# Patient Record
Sex: Male | Born: 1955 | Race: White | Hispanic: No | Marital: Married | Smoking: Former smoker
Health system: Southern US, Community
[De-identification: ages and names within clinical notes are randomized; demographics above are authoritative.]

## PROBLEM LIST (undated history)

## (undated) DIAGNOSIS — I10 Essential (primary) hypertension: Secondary | ICD-10-CM

## (undated) DIAGNOSIS — Z86010 Personal history of colonic polyps: Secondary | ICD-10-CM

## (undated) HISTORY — PX: COLONOSCOPY: SHX174

## (undated) HISTORY — DX: Essential (primary) hypertension: I10

## (undated) HISTORY — DX: Personal history of colonic polyps: Z86.010

---

## 2000-03-12 ENCOUNTER — Emergency Department (HOSPITAL_COMMUNITY): Admission: EM | Admit: 2000-03-12 | Discharge: 2000-03-12 | Payer: Self-pay | Admitting: Emergency Medicine

## 2002-02-13 ENCOUNTER — Emergency Department (HOSPITAL_COMMUNITY): Admission: EM | Admit: 2002-02-13 | Discharge: 2002-02-13 | Payer: Self-pay | Admitting: Emergency Medicine

## 2002-02-15 ENCOUNTER — Emergency Department (HOSPITAL_COMMUNITY): Admission: EM | Admit: 2002-02-15 | Discharge: 2002-02-15 | Payer: Self-pay | Admitting: Emergency Medicine

## 2002-02-15 ENCOUNTER — Encounter: Payer: Self-pay | Admitting: Emergency Medicine

## 2005-01-05 HISTORY — PX: APPENDECTOMY: SHX54

## 2008-04-10 ENCOUNTER — Ambulatory Visit: Payer: Self-pay | Admitting: Internal Medicine

## 2008-04-24 ENCOUNTER — Encounter: Payer: Self-pay | Admitting: Internal Medicine

## 2008-04-24 ENCOUNTER — Ambulatory Visit: Payer: Self-pay | Admitting: Internal Medicine

## 2008-04-24 DIAGNOSIS — Z860100 Personal history of colon polyps, unspecified: Secondary | ICD-10-CM

## 2008-04-24 HISTORY — DX: Personal history of colonic polyps: Z86.010

## 2008-04-24 HISTORY — DX: Personal history of colon polyps, unspecified: Z86.0100

## 2008-04-30 ENCOUNTER — Encounter: Payer: Self-pay | Admitting: Internal Medicine

## 2008-11-21 ENCOUNTER — Encounter (INDEPENDENT_AMBULATORY_CARE_PROVIDER_SITE_OTHER): Payer: Self-pay | Admitting: General Surgery

## 2008-11-21 ENCOUNTER — Inpatient Hospital Stay (HOSPITAL_COMMUNITY): Admission: EM | Admit: 2008-11-21 | Discharge: 2008-11-25 | Payer: Self-pay | Admitting: Emergency Medicine

## 2009-04-25 ENCOUNTER — Ambulatory Visit: Payer: Self-pay | Admitting: Internal Medicine

## 2009-04-25 DIAGNOSIS — I10 Essential (primary) hypertension: Secondary | ICD-10-CM

## 2009-04-25 DIAGNOSIS — Z860100 Personal history of colon polyps, unspecified: Secondary | ICD-10-CM | POA: Insufficient documentation

## 2009-04-29 ENCOUNTER — Encounter: Payer: Self-pay | Admitting: Internal Medicine

## 2009-04-29 ENCOUNTER — Telehealth: Payer: Self-pay | Admitting: Internal Medicine

## 2009-04-29 DIAGNOSIS — E781 Pure hyperglyceridemia: Secondary | ICD-10-CM | POA: Insufficient documentation

## 2009-05-06 ENCOUNTER — Telehealth: Payer: Self-pay | Admitting: Internal Medicine

## 2009-05-21 ENCOUNTER — Telehealth: Payer: Self-pay | Admitting: Internal Medicine

## 2009-05-22 ENCOUNTER — Ambulatory Visit: Payer: Self-pay | Admitting: Internal Medicine

## 2009-05-22 LAB — CONVERTED CEMR LAB
Cholesterol: 189 mg/dL (ref 0–200)
HDL: 46.3 mg/dL (ref >39.00)
LDL Cholesterol: 124 mg/dL — ABNORMAL HIGH (ref 0–99)
Total CHOL/HDL Ratio: 4
Triglycerides: 93 mg/dL (ref 0.0–149.0)
VLDL: 18.6 mg/dL (ref 0.0–40.0)

## 2009-05-27 ENCOUNTER — Ambulatory Visit: Payer: Self-pay | Admitting: Internal Medicine

## 2009-05-27 DIAGNOSIS — R7309 Other abnormal glucose: Secondary | ICD-10-CM

## 2009-05-27 DIAGNOSIS — E876 Hypokalemia: Secondary | ICD-10-CM

## 2009-05-27 DIAGNOSIS — N529 Male erectile dysfunction, unspecified: Secondary | ICD-10-CM

## 2009-05-27 LAB — CONVERTED CEMR LAB
BUN: 15 mg/dL (ref 6–23)
CO2: 28 meq/L (ref 19–32)
Calcium: 8.8 mg/dL (ref 8.4–10.5)
Chloride: 105 meq/L (ref 96–112)
Cholesterol, target level: 200 mg/dL
Creatinine, Ser: 0.9 mg/dL (ref 0.4–1.5)
GFR calc non Af Amer: 93.44 mL/min (ref >60)
Glucose, Bld: 108 mg/dL — ABNORMAL HIGH (ref 70–99)
HDL goal, serum: 40 mg/dL
Hgb A1c MFr Bld: 5.6 % (ref 4.6–6.5)
LDL Goal: 130 mg/dL
Magnesium: 2.1 mg/dL (ref 1.5–2.5)
Potassium: 4 meq/L (ref 3.5–5.1)
Sodium: 138 meq/L (ref 135–145)
Testosterone: 360.14 ng/dL (ref 350.00–890.00)

## 2009-05-29 ENCOUNTER — Telehealth: Payer: Self-pay | Admitting: Internal Medicine

## 2009-05-30 ENCOUNTER — Telehealth: Payer: Self-pay | Admitting: Internal Medicine

## 2009-05-30 ENCOUNTER — Ambulatory Visit: Payer: Self-pay | Admitting: Internal Medicine

## 2009-05-30 DIAGNOSIS — E291 Testicular hypofunction: Secondary | ICD-10-CM

## 2009-05-30 LAB — CONVERTED CEMR LAB
FSH: 3.9 milliintl units/mL (ref 1.4–18.1)
LH: 2.06 milliintl units/mL (ref 1.50–9.30)
Prolactin: 4.8 ng/mL
Testosterone: 353.55 ng/dL (ref 350.00–890.00)

## 2009-07-04 ENCOUNTER — Encounter: Payer: Self-pay | Admitting: Internal Medicine

## 2009-07-16 ENCOUNTER — Ambulatory Visit: Payer: Self-pay | Admitting: Internal Medicine

## 2009-07-16 ENCOUNTER — Telehealth: Payer: Self-pay | Admitting: Internal Medicine

## 2009-07-16 DIAGNOSIS — L03119 Cellulitis of unspecified part of limb: Secondary | ICD-10-CM

## 2009-07-16 DIAGNOSIS — L02419 Cutaneous abscess of limb, unspecified: Secondary | ICD-10-CM | POA: Insufficient documentation

## 2009-09-16 ENCOUNTER — Telehealth: Payer: Self-pay | Admitting: Internal Medicine

## 2010-01-13 ENCOUNTER — Ambulatory Visit
Admission: RE | Admit: 2010-01-13 | Discharge: 2010-01-13 | Payer: Self-pay | Source: Home / Self Care | Attending: Internal Medicine | Admitting: Internal Medicine

## 2010-01-13 DIAGNOSIS — G4700 Insomnia, unspecified: Secondary | ICD-10-CM | POA: Insufficient documentation

## 2010-02-02 LAB — CONVERTED CEMR LAB
ALT: 22 units/L (ref 0–53)
AST: 19 units/L (ref 0–37)
Albumin: 4.1 g/dL (ref 3.5–5.2)
Alkaline Phosphatase: 41 units/L (ref 39–117)
BUN: 11 mg/dL (ref 6–23)
Basophils Absolute: 0 10*3/uL (ref 0.0–0.1)
Basophils Relative: 0.7 % (ref 0.0–3.0)
Bilirubin Urine: NEGATIVE
Bilirubin, Direct: 0 mg/dL (ref 0.0–0.3)
CO2: 25 meq/L (ref 19–32)
Calcium: 9 mg/dL (ref 8.4–10.5)
Chloride: 104 meq/L (ref 96–112)
Cholesterol: 176 mg/dL (ref 0–200)
Creatinine, Ser: 0.8 mg/dL (ref 0.4–1.5)
Direct LDL: 91.3 mg/dL
Eosinophils Absolute: 0.4 10*3/uL (ref 0.0–0.7)
Eosinophils Relative: 5 % (ref 0.0–5.0)
GFR calc non Af Amer: 107.08 mL/min (ref >60)
Glucose, Bld: 110 mg/dL — ABNORMAL HIGH (ref 70–99)
HCT: 40 % (ref 39.0–52.0)
HDL: 43 mg/dL (ref >39.00)
Hemoglobin: 14.2 g/dL (ref 13.0–17.0)
Ketones, ur: NEGATIVE mg/dL
Leukocytes, UA: NEGATIVE
Lymphocytes Relative: 29.8 % (ref 12.0–46.0)
Lymphs Abs: 2.1 10*3/uL (ref 0.7–4.0)
MCHC: 35.6 g/dL (ref 30.0–36.0)
MCV: 86.2 fL (ref 78.0–100.0)
Monocytes Absolute: 0.6 10*3/uL (ref 0.1–1.0)
Monocytes Relative: 8.3 % (ref 3.0–12.0)
Neutro Abs: 3.9 10*3/uL (ref 1.4–7.7)
Neutrophils Relative %: 56.2 % (ref 43.0–77.0)
Nitrite: NEGATIVE
PSA: 0.23 ng/mL (ref 0.10–4.00)
Platelets: 309 10*3/uL (ref 150.0–400.0)
Potassium: 3.2 meq/L — ABNORMAL LOW (ref 3.5–5.1)
RBC: 4.64 M/uL (ref 4.22–5.81)
RDW: 12.9 % (ref 11.5–14.6)
Sodium: 137 meq/L (ref 135–145)
Specific Gravity, Urine: 1.025 (ref 1.000–1.030)
TSH: 0.68 microintl units/mL (ref 0.35–5.50)
Total Bilirubin: 0.1 mg/dL — ABNORMAL LOW (ref 0.3–1.2)
Total CHOL/HDL Ratio: 4
Total Protein, Urine: NEGATIVE mg/dL
Total Protein: 6.2 g/dL (ref 6.0–8.3)
Triglycerides: 571 mg/dL — ABNORMAL HIGH (ref 0.0–149.0)
Urine Glucose: NEGATIVE mg/dL
Urobilinogen, UA: 0.2 (ref 0.0–1.0)
VLDL: 114.2 mg/dL — ABNORMAL HIGH (ref 0.0–40.0)
WBC: 7 10*3/uL (ref 4.5–10.5)
pH: 6 (ref 5.0–8.0)

## 2010-02-04 NOTE — Letter (Signed)
Summary: Results Follow-up Letter  [facility]  [address] [place], [place] [address]   Phone: [phone]  Fax: [phone]    05/27/2009  [address] [place], [place]  [address]  Dear Mr. CALLEROS,   The following are the results of your recent test(s):  Test     Result     Testosterone   low/normal range Kidney     normal Blood sugars   normal   _________________________________________________________  Please call for an appointment as directed _________________________________________________________ _________________________________________________________ _________________________________________________________  Sincerely,  Sanda Linger MD  [facility]

## 2010-02-04 NOTE — Progress Notes (Signed)
  Phone Note Outgoing Call   Summary of Call: LA: please tell him that his potassium level is low so I need a pharm. for an Rx. Initial call taken by: Etta Grandchild MD,  April 29, 2009 8:28 AM  Follow-up for Phone Call        the rx was sent Follow-up by: Etta Grandchild MD,  April 29, 2009 8:27 AM  Additional Follow-up for Phone Call Additional follow up Details #1::        Patient notified and advised of lab results. He want to know if he can come by sometime an do a fasting cholesterol check?Marland KitchenMarland KitchenMarland KitchenAlvy Beal Archie CMA  April 30, 2009 10:56 AM  Additional Follow-up by: Etta Grandchild MD,  April 30, 2009 10:58 AM  New Problems: HYPERTRIGLYCERIDEMIA (ICD-272.1)   Additional Follow-up for Phone Call Additional follow up Details #2::    yes Follow-up by: Etta Grandchild MD,  April 30, 2009 10:59 AM  Additional Follow-up for Phone Call Additional follow up Details #3:: Details for Additional Follow-up Action Taken: patient notified and lab order placed in IDX.Marland KitchenAlvy Beal Archie CMA  April 30, 2009 11:50 AM   New Problems: HYPERTRIGLYCERIDEMIA (ICD-272.1) New/Updated Medications: KLOR-CON 10 10 MEQ CR-TABS (POTASSIUM CHLORIDE) One by mouth two times a day with food Prescriptions: KLOR-CON 10 10 MEQ CR-TABS (POTASSIUM CHLORIDE) One by mouth two times a day with food  #60 x 11   Entered and Authorized by:   Etta Grandchild MD   Signed by:   Etta Grandchild MD on 04/29/2009   Method used:   Electronically to        [facility] [address] [phone]* (retail)       [address] [place], [place]  [address]       Ph: [phone]       Fax: [phone]   RxID:   [id number]

## 2010-02-04 NOTE — Letter (Signed)
Summary: Lipid Letter  [facility]  [address] [place], [place] [address]   Phone: [phone]  Fax: [phone]    05/22/2009  Juan [name] [address] [place], [place]  [address]  Dear Shon Hale:  We have carefully reviewed your last lipid profile from 05/22/2009 and the results are noted below with a summary of recommendations for lipid management.    Cholesterol:       189     Goal: <200   HDL "good" Cholesterol:   43.32     Goal: >40   LDL "bad" Cholesterol:   124     Goal: <130   Triglycerides:       93.0     Goal: <150    GOOD RESULTS    TLC Diet (Therapeutic Lifestyle Change): Saturated Fats & Transfatty acids should be kept < 7% of total calories ***Reduce Saturated Fats Polyunstaurated Fat can be up to 10% of total calories Monounsaturated Fat Fat can be up to 20% of total calories Total Fat should be no greater than 25-35% of total calories Carbohydrates should be 50-60% of total calories Protein should be approximately 15% of total calories Fiber should be at least 20-30 grams a day ***Increased fiber may help lower LDL Total Cholesterol should be < 200mg /day Consider adding plant stanol/sterols to diet (example: Benacol spread) ***A higher intake of unsaturated fat may reduce Triglycerides and Increase HDL    Adjunctive Measures (may lower LIPIDS and reduce risk of Heart Attack) include: Aerobic Exercise (20-30 minutes 3-4 times a week) Limit Alcohol Consumption Weight Reduction Aspirin 75-81 mg a day by mouth (if not allergic or contraindicated) Dietary Fiber 20-30 grams a day by mouth     Current Medications: 1)    Aspirin 81 Mg Tabs (Aspirin) .Marland Kitchen.. 1 by mouth once daily 2)    Fish Oil 1200 Mg Caps (Omega-3 fatty acids) .Marland Kitchen.. 1 by mouth once daily 3)    Multivitamins  Caps (Multiple vitamin) .Marland Kitchen.. 1 by mouth once daily 4)    Losartan Potassium-hctz 50-12.5 Mg Tabs (Losartan potassium-hctz) .... One by mouth once daily for high blood  pressure 5)    Klor-con 10 10 Meq Cr-tabs (Potassium chloride) .... One by mouth two times a day with food  If you have any questions, please call. We appreciate being able to work with you.   Sincerely,    [facility] Etta Grandchild MD

## 2010-02-04 NOTE — Assessment & Plan Note (Signed)
Summary: fatigue, ?'s about testosterone/needs lipids rechecked /SD   Vital Signs:  Patient profile:   55 year old male Height:      72 inches Weight:      191 pounds BMI:     26.00 O2 Sat:      98 % on Room air Temp:     98.1 degrees F oral Pulse rate:   68 / minute Pulse rhythm:   regular Resp:     16 per minute BP sitting:   106 / 70  (left arm) Cuff size:   large  Vitals Entered By: Rock Nephew CMA (May 27, 2009 8:17 AM)  Nutrition Counseling: Patient's BMI is greater than 25 and therefore counseled on weight management options.  O2 Flow:  Room air CC: follow up/ review test results, Hypertension Management, Lipid Management Is Patient Diabetic? No Pain Assessment Patient in pain? no        Primary Care Provider:  Etta Grandchild MD  CC:  follow up/ review test results, Hypertension Management, and Lipid Management.  History of Present Illness: He returns for f/up and wants to have his testosterone level checked due to a 1-2 month hx. of fatigue and decreased libido.  Hypertension History:      He denies headache, chest pain, palpitations, dyspnea with exertion, orthopnea, PND, peripheral edema, visual symptoms, neurologic problems, syncope, and side effects from treatment.  He notes no problems with any antihypertensive medication side effects.        Positive major cardiovascular risk factors include male age 33 years old or older, hyperlipidemia, hypertension, and family history for ischemic heart disease (males less than 26 years old).  Negative major cardiovascular risk factors include no history of diabetes and non-tobacco-user status.        Further assessment for target organ damage reveals no history of ASHD, cardiac end-organ damage (CHF/LVH), stroke/TIA, peripheral vascular disease, renal insufficiency, or hypertensive retinopathy.    Lipid Management History:      Positive NCEP/ATP III risk factors include male age 53 years old or older, family history  for ischemic heart disease (males less than 53 years old), and hypertension.  Negative NCEP/ATP III risk factors include non-diabetic, non-tobacco-user status, no ASHD (atherosclerotic heart disease), no prior stroke/TIA, no peripheral vascular disease, and no history of aortic aneurysm.        The patient states that he knows about the "Therapeutic Lifestyle Change" diet.  His compliance with the TLC diet is excellent.  The patient expresses understanding of adjunctive measures for cholesterol lowering.  Adjunctive measures started by the patient include aerobic exercise, fiber, ASA, omega-3 supplements, limit alcohol consumpton, and weight reduction.  He expresses no side effects from his lipid-lowering medication.  The patient denies any symptoms to suggest myopathy or liver disease.      Preventive Screening-Counseling & Management  Alcohol-Tobacco     Alcohol drinks/day: 0     Smoking Status: quit  Hep-HIV-STD-Contraception     Hepatitis Risk: no risk noted     HIV Risk: no risk noted     STD Risk: no risk noted     SBE monthly: yes     SBE Education/Counseling: to perform regular SBE      Sexual History:  currently monogamous.        Drug Use:  no.        Blood Transfusions:  no.    Clinical Review Panels:  Lipid Management   Cholesterol:  189 (05/22/2009)   LDL (  bad choesterol):  124 (05/22/2009)   HDL (good cholesterol):  46.30 (05/22/2009)  Diabetes Management   Creatinine:  0.8 (04/25/2009)  CBC   WBC:  7.0 (04/25/2009)   RBC:  4.64 (04/25/2009)   Hgb:  14.2 (04/25/2009)   Hct:  40.0 (04/25/2009)   Platelets:  309.0 (04/25/2009)   MCV  86.2 (04/25/2009)   MCHC  35.6 (04/25/2009)   RDW  12.9 (04/25/2009)   PMN:  56.2 (04/25/2009)   Lymphs:  29.8 (04/25/2009)   Monos:  8.3 (04/25/2009)   Eosinophils:  5.0 (04/25/2009)   Basophil:  0.7 (04/25/2009)  Complete Metabolic Panel   Glucose:  110 (04/25/2009)   Sodium:  137 (04/25/2009)   Potassium:  3.2  (04/25/2009)   Chloride:  104 (04/25/2009)   CO2:  25 (04/25/2009)   BUN:  11 (04/25/2009)   Creatinine:  0.8 (04/25/2009)   Albumin:  4.1 (04/25/2009)   Total Protein:  6.2 (04/25/2009)   Calcium:  9.0 (04/25/2009)   Total Bili:  0.1 (04/25/2009)   Alk Phos:  41 (04/25/2009)   SGPT (ALT):  22 (04/25/2009)   SGOT (AST):  19 (04/25/2009)   Medications Prior to Update: 1)  Aspirin 81 Mg Tabs (Aspirin) .Marland Kitchen.. 1 By Mouth Once Daily 2)  Fish Oil 1200 Mg Caps (Omega-3 Fatty Acids) .Marland Kitchen.. 1 By Mouth Once Daily 3)  Multivitamins  Caps (Multiple Vitamin) .Marland Kitchen.. 1 By Mouth Once Daily 4)  Losartan Potassium-Hctz 50-12.5 Mg Tabs (Losartan Potassium-Hctz) .... One By Mouth Once Daily For High Blood Pressure 5)  Klor-Con 10 10 Meq Cr-Tabs (Potassium Chloride) .... One By Mouth Two Times A Day With Food  Current Medications (verified): 1)  Aspirin 81 Mg Tabs (Aspirin) .Marland Kitchen.. 1 By Mouth Once Daily 2)  Fish Oil 1200 Mg Caps (Omega-3 Fatty Acids) .Marland Kitchen.. 1 By Mouth Once Daily 3)  Multivitamins  Caps (Multiple Vitamin) .Marland Kitchen.. 1 By Mouth Once Daily 4)  Losartan Potassium-Hctz 50-12.5 Mg Tabs (Losartan Potassium-Hctz) .... One By Mouth Once Daily For High Blood Pressure  Allergies (verified): 1)  ! Ace Inhibitors  Past History:  Past Medical History: Reviewed history from 04/25/2009 and no changes required. Colonic polyps, hx of Hypertension He did rehab 12 years ago and has been screened several  times for HIV and Hep A/B/C and always negative  Past Surgical History: Reviewed history from 04/25/2009 and no changes required. Appendectomy  Family History: n/a  Social History: Reviewed history from 04/25/2009 and no changes required. 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   GU:  Complains of decreased libido; denies discharge, dysuria, erectile dysfunction, genital sores, nocturia, urinary frequency, and urinary hesitancy. Endo:  Denies cold intolerance, excessive hunger, excessive thirst, excessive urination, heat intolerance, polyuria, and weight change.  Physical Exam  General:  alert, well-developed, well-nourished, well-hydrated, appropriate dress, normal appearance, healthy-appearing, cooperative to examination, and good hygiene.   Eyes:  vision grossly intact, pupils equal, pupils round, and pupils reactive to light.   Mouth:  Oral mucosa and oropharynx without lesions or exudates.  Teeth in good repair. Neck:  supple, full ROM, no masses, no thyromegaly, no thyroid nodules or tenderness, no JVD, normal carotid upstroke, no carotid bruits, no cervical lymphadenopathy, and no neck tenderness.   Lungs:  Normal  respiratory effort, chest expands symmetrically. Lungs are clear to auscultation, no crackles or wheezes. Heart:  Normal rate and regular rhythm. S1 and S2 normal without gallop, murmur, click, rub or other extra sounds. Abdomen:  soft, non-tender, normal bowel sounds, no distention, no masses, no guarding, no rigidity, no rebound tenderness, no abdominal hernia, no inguinal hernia, no hepatomegaly, no splenomegaly, and abdominal scar(s).   Genitalia:  circumcised, no hydrocele, no varicocele, no scrotal masses, no testicular masses or atrophy, no cutaneous lesions, and no urethral discharge.   Prostate:  no nodules, no asymmetry, no induration, and 1+ enlarged.   Msk:  No deformity or scoliosis noted of thoracic or lumbar spine.   Pulses:  R and L carotid,radial,femoral,dorsalis pedis and posterior tibial pulses are full and equal bilaterally Extremities:  No clubbing, cyanosis, edema, or deformity noted with normal full range of motion of all joints.   Neurologic:  No  cranial nerve deficits noted. Station and gait are normal. Plantar reflexes are down-going bilaterally. DTRs are symmetrical throughout. Sensory, motor and coordinative functions appear intact. Skin:  turgor normal, color normal, no rashes, no suspicious lesions, no ecchymoses, no petechiae, no purpura, no ulcerations, no edema, and tattoo(s).   Cervical Nodes:  no anterior cervical adenopathy and no posterior cervical adenopathy.   Psych:  Cognition and judgment appear intact. Alert and cooperative with normal attention span and concentration. No apparent delusions, illusions, hallucinations   Impression & Recommendations:  Problem # 1:  HYPERGLYCEMIA, BORDERLINE (ICD-790.29) Assessment New  Orders: Venipuncture ([address]) TLB-BMP (Basic Metabolic Panel-BMET) (80048-METABOL) TLB-A1C / Hgb A1C (Glycohemoglobin) (83036-A1C) TLB-Magnesium (Mg) (83735-MG) TLB-Testosterone, Total (84403-TESTO)  Labs Reviewed: Creat: 0.8 (04/25/2009)     Problem # 2:  HYPOKALEMIA (ICD-276.8) Assessment: Unchanged  Orders: Venipuncture ([address]) TLB-BMP (Basic Metabolic Panel-BMET) (80048-METABOL) TLB-A1C / Hgb A1C (Glycohemoglobin) (83036-A1C) TLB-Magnesium (Mg) (83735-MG) TLB-Testosterone, Total (84403-TESTO)  Problem # 3:  LIBIDO, DECREASED (ICD-799.81) Assessment: New  Orders: Venipuncture ([address]) TLB-BMP (Basic Metabolic Panel-BMET) (80048-METABOL) TLB-A1C / Hgb A1C (Glycohemoglobin) (83036-A1C) TLB-Magnesium (Mg) (83735-MG) TLB-Testosterone, Total (84403-TESTO)  Problem # 4:  HYPERTRIGLYCERIDEMIA (ICD-272.1) Assessment: Improved  Labs Reviewed: SGOT: 19 (04/25/2009)   SGPT: 22 (04/25/2009)  Lipid Goals: Chol Goal: 200 (05/27/2009)   HDL Goal: 40 (05/27/2009)   LDL Goal: 130 (05/27/2009)   TG Goal: 150 (05/27/2009)  10 Yr Risk Heart Disease: 7 % Prior 10 Yr Risk Heart Disease: Not enough information (04/25/2009)   HDL:46.30 (05/22/2009), 43.00 (04/25/2009)  LDL:124 (05/22/2009)  Chol:189  (05/22/2009), 176 (04/25/2009)  Trig:93.0 (05/22/2009), 571.0 (04/25/2009)  Problem # 5:  HYPERTENSION (ICD-401.9) Assessment: Improved  His updated medication list for this problem includes:    Losartan Potassium-hctz 50-12.5 Mg Tabs (Losartan potassium-hctz) ..... One by mouth once daily for high blood pressure  BP today: 106/70 Prior BP: 118/80 (04/25/2009)  10 Yr Risk Heart Disease: 7 % Prior 10 Yr Risk Heart Disease: Not enough information (04/25/2009)  Labs Reviewed: K+: 3.2 (04/25/2009) Creat: : 0.8 (04/25/2009)   Chol: 189 (05/22/2009)   HDL: 46.30 (05/22/2009)   LDL: 124 (05/22/2009)   TG: 93.0 (05/22/2009)  Complete Medication List: 1)  Aspirin 81 Mg Tabs (Aspirin) .Marland Kitchen.. 1 by mouth once daily 2)  Fish Oil 1200 Mg Caps (Omega-3 fatty acids) .Marland Kitchen.. 1 by mouth once daily 3)  Multivitamins Caps (Multiple vitamin) .Marland Kitchen.. 1 by mouth once daily 4)  Losartan Potassium-hctz 50-12.5 Mg Tabs (Losartan potassium-hctz) .... One by mouth once daily for high blood pressure  Hypertension Assessment/Plan:      The patient's  hypertensive risk group is category B: At least one risk factor (excluding diabetes) with no target organ damage.  His calculated 10 year risk of coronary heart disease is 7 %.  Today's blood pressure is 106/70.  His blood pressure goal is < 140/90.  Lipid Assessment/Plan:      Based on NCEP/ATP III, the patient's risk factor category is "2 or more risk factors and a calculated 10 year CAD risk of < 20%".  The patient's lipid goals are as follows: Total cholesterol goal is 200; LDL cholesterol goal is 130; HDL cholesterol goal is 40; Triglyceride goal is 150.    Patient Instructions: 1)  Please schedule a follow-up appointment in 1 month. 2)  Check your Blood Pressure regularly. If it is above 140/90: you should make an appointment.   Not Administered:    Influenza Vaccine not given due to: vaccine availability

## 2010-02-04 NOTE — Progress Notes (Signed)
     Follow-up for Phone Call       Follow-up by: Etta Grandchild MD,  May 30, 2009 8:04 AM    New/Updated Medications: * AXIRON 30 MG Apply one pump under each arm once daily Prescriptions: AXIRON 30 MG Apply one pump under each arm once daily  #1 bottle x 5   Entered and Authorized by:   Etta Grandchild MD   Signed by:   Etta Grandchild MD on 05/30/2009   Method used:   Print then Give to Patient   RxID:   [id number]

## 2010-02-04 NOTE — Progress Notes (Signed)
Summary: Call Report  Phone Note Other Incoming Call back at Home Phone [phone]   Caller: [facility] Summary of Call: [facility] Triage Call Report Triage Record Num: [id number] Operator: Chevis Pretty Patient Name: Jaevion Goto Call Date & Time: 09/14/2009 5:29:14PM Patient Phone: [phone] PCP: Patient Gender: Male PCP Fax : Patient DOB: Dec 08, 1955 Practice Name: [facility] Reason for Call: Patient calling about rash. Started bp meds/lasortan 4 months ago or so, but onset of generalized itching a week or so ago, with fine raised red rash beginning 09/09/09. Decided to stop medication 09/14/09, and itching got better. Does note on questioning, that when he picked up latest refill, pharmacist told him that it was from a different manufacturer. Last dose > 24 hours ago, with some improvement of rash, and good improvement of itching. Advised to be seen within 24 hours; patient refuses at this time, because he states the rash is better. States will not take more medication until speaks with office Monday 09/16/09. Protocol(s) Used: Rash Recommended Outcome per Protocol: See Noor Vidales within 24 hours Reason for Outcome: Severe or persistent itching that interferes with ability to carry out usual activities or sleep Care Advice:  ~ 09/ Initial call taken by: Margaret Pyle, CMA,  September 16, 2009 8:04 AM  Follow-up for Phone Call        Please advise if pt need to be seen here in the office. Thanks.Alvy Beal Archie CMA  September 16, 2009 9:54 AM

## 2010-02-04 NOTE — Letter (Signed)
Summary: Lipid Letter  [facility]  [address] [place], [place] [address]   Phone: [phone]  Fax: [phone]    04/29/2009  Juan [name] [address] [place], [place]  [address]  Dear Juan [name]:  We have carefully reviewed your last lipid profile from  and the results are noted below with a summary of recommendations for lipid management.    Cholesterol:       176     Goal: <200   HDL "good" Cholesterol:   57.84     Goal: >40   LDL "bad" Cholesterol:   91     Goal: <130   Triglycerides:       571.0     Goal: <150    WOW, you need to do some work on this and possibly take a medication. Please follow-up soon for a fasting lab test.    TLC Diet (Therapeutic Lifestyle Change): Saturated Fats & Transfatty acids should be kept < 7% of total calories ***Reduce Saturated Fats Polyunstaurated Fat can be up to 10% of total calories Monounsaturated Fat Fat can be up to 20% of total calories Total Fat should be no greater than 25-35% of total calories Carbohydrates should be 50-60% of total calories Protein should be approximately 15% of total calories Fiber should be at least 20-30 grams a day ***Increased fiber may help lower LDL Total Cholesterol should be < 200mg /day Consider adding plant stanol/sterols to diet (example: Benacol spread) ***A higher intake of unsaturated fat may reduce Triglycerides and Increase HDL    Adjunctive Measures (may lower LIPIDS and reduce risk of Heart Attack) include: Aerobic Exercise (20-30 minutes 3-4 times a week) Limit Alcohol Consumption Weight Reduction Aspirin 75-81 mg a day by mouth (if not allergic or contraindicated) Dietary Fiber 20-30 grams a day by mouth     Current Medications: 1)    Aspirin 81 Mg Tabs (Aspirin) .Marland Kitchen.. 1 by mouth once daily 2)    Fish Oil 1200 Mg Caps (Omega-3 fatty acids) .Marland Kitchen.. 1 by mouth once daily 3)    Multivitamins  Caps (Multiple vitamin) .Marland Kitchen.. 1 by mouth once daily 4)    Losartan Potassium-hctz  50-12.5 Mg Tabs (Losartan potassium-hctz) .... One by mouth once daily for high blood pressure 5)    Klor-con 10 10 Meq Cr-tabs (Potassium chloride) .... One by mouth two times a day with food  If you have any questions, please call. We appreciate being able to work with you.   Sincerely,    [facility] Etta Grandchild MD

## 2010-02-04 NOTE — Assessment & Plan Note (Signed)
Summary: PER PT RECEIVED SCHED APPT LETTER/FU ON LABS-- [facility]   Vital Signs:  Patient profile:   55 year old male Height:      72 inches Weight:      193 pounds BMI:     26.27 O2 Sat:      97 % on Room air Temp:     97.8 degrees F oral Pulse rate:   62 / minute Pulse rhythm:   regular Resp:     16 per minute BP sitting:   110 / 70  (left arm) Cuff size:   large  Vitals Entered By: Rock Nephew CMA (May 30, 2009 8:20 AM)  Nutrition Counseling: Patient's BMI is greater than 25 and therefore counseled on weight management options.  O2 Flow:  Room air CC: follow-up visit   Primary Care Provider:  Etta Grandchild MD  CC:  follow-up visit.  History of Present Illness: He returns and wants to start TRT. His T level was in the very low/normal range.  Preventive Screening-Counseling & Management  Alcohol-Tobacco     Alcohol drinks/day: 0     Smoking Status: quit  Hep-HIV-STD-Contraception     Hepatitis Risk: no risk noted     HIV Risk: no risk noted     STD Risk: no risk noted     SBE monthly: yes     SBE Education/Counseling: to perform regular SBE      Sexual History:  currently monogamous.        Drug Use:  no.        Blood Transfusions:  no.    Clinical Review Panels:  Lipid Management   Cholesterol:  189 (05/22/2009)   LDL (bad choesterol):  124 (05/22/2009)   HDL (good cholesterol):  46.30 (05/22/2009)  Diabetes Management   HgBA1C:  5.6 (05/27/2009)   Creatinine:  0.9 (05/27/2009)  CBC   WBC:  7.0 (04/25/2009)   RBC:  4.64 (04/25/2009)   Hgb:  14.2 (04/25/2009)   Hct:  40.0 (04/25/2009)   Platelets:  309.0 (04/25/2009)   MCV  86.2 (04/25/2009)   MCHC  35.6 (04/25/2009)   RDW  12.9 (04/25/2009)   PMN:  56.2 (04/25/2009)   Lymphs:  29.8 (04/25/2009)   Monos:  8.3 (04/25/2009)   Eosinophils:  5.0 (04/25/2009)   Basophil:  0.7 (04/25/2009)  Complete Metabolic Panel   Glucose:  108 (05/27/2009)   Sodium:  138 (05/27/2009)   Potassium:  4.0  (05/27/2009)   Chloride:  105 (05/27/2009)   CO2:  28 (05/27/2009)   BUN:  15 (05/27/2009)   Creatinine:  0.9 (05/27/2009)   Albumin:  4.1 (04/25/2009)   Total Protein:  6.2 (04/25/2009)   Calcium:  8.8 (05/27/2009)   Total Bili:  0.1 (04/25/2009)   Alk Phos:  41 (04/25/2009)   SGPT (ALT):  22 (04/25/2009)   SGOT (AST):  19 (04/25/2009)   Medications Prior to Update: 1)  Aspirin 81 Mg Tabs (Aspirin) .Marland Kitchen.. 1 By Mouth Once Daily 2)  Fish Oil 1200 Mg Caps (Omega-3 Fatty Acids) .Marland Kitchen.. 1 By Mouth Once Daily 3)  Multivitamins  Caps (Multiple Vitamin) .Marland Kitchen.. 1 By Mouth Once Daily 4)  Losartan Potassium-Hctz 50-12.5 Mg Tabs (Losartan Potassium-Hctz) .... One By Mouth Once Daily For High Blood Pressure  Current Medications (verified): 1)  Aspirin 81 Mg Tabs (Aspirin) .Marland Kitchen.. 1 By Mouth Once Daily 2)  Fish Oil 1200 Mg Caps (Omega-3 Fatty Acids) .Marland Kitchen.. 1 By Mouth Once Daily 3)  Multivitamins  Caps (Multiple Vitamin) .Marland KitchenMarland KitchenMarland Kitchen  1 By Mouth Once Daily 4)  Losartan Potassium-Hctz 50-12.5 Mg Tabs (Losartan Potassium-Hctz) .... One By Mouth Once Daily For High Blood Pressure 5)  Axiron 30 Mg .... Apply One Pump Under Each Arm Once Daily  Allergies (verified): 1)  ! Ace Inhibitors  Past History:  Past Medical History: Reviewed history from 04/25/2009 and no changes required. Colonic polyps, hx of Hypertension He did rehab 12 years ago and has been screened several  times for HIV and Hep A/B/C and always negative  Past Surgical History: Reviewed history from 04/25/2009 and no changes required. Appendectomy  Family History: Reviewed history from 05/27/2009 and no changes required. n/a  Social History: Reviewed history from 04/25/2009 and no changes required. 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 GU:  Complains of decreased libido and erectile dysfunction; denies discharge, dysuria, hematuria, nocturia, urinary frequency, and urinary hesitancy.  Physical Exam  General:  alert, well-developed, well-nourished, well-hydrated, appropriate dress, normal appearance, healthy-appearing, cooperative to examination, and good hygiene.   Mouth:  Oral mucosa and oropharynx without lesions or exudates.  Teeth in good repair. Neck:  supple, full ROM, no masses, no thyromegaly, no thyroid nodules or tenderness, no JVD, normal carotid upstroke, no carotid bruits, no cervical lymphadenopathy, and no neck tenderness.   Lungs:  Normal respiratory effort, chest expands symmetrically. Lungs are clear to auscultation, no crackles or wheezes. Heart:  Normal rate and regular rhythm. S1 and S2 normal without gallop, murmur, click, rub or other extra sounds. Abdomen:  soft, non-tender, normal bowel sounds, no distention, no masses, no guarding, no rigidity, no rebound tenderness, no abdominal hernia, no inguinal hernia, no hepatomegaly, no splenomegaly, and abdominal scar(s).   Msk:  No deformity or scoliosis noted of thoracic or lumbar spine.   Pulses:  R and L carotid,radial,femoral,dorsalis pedis and posterior tibial pulses are full and equal bilaterally Extremities:  No clubbing, cyanosis, edema, or deformity noted with normal full range of motion of all joints.   Neurologic:  No cranial nerve deficits noted. Station and gait are normal. Plantar reflexes are down-going bilaterally. DTRs are symmetrical throughout. Sensory, motor and coordinative functions appear intact. Skin:  turgor normal, color normal, no rashes, no suspicious lesions, no ecchymoses, no petechiae, no purpura, no ulcerations, no edema, and tattoo(s).   Cervical Nodes:  no anterior cervical adenopathy and no posterior cervical adenopathy.   Psych:  Cognition and judgment appear intact. Alert and cooperative with normal  attention span and concentration. No apparent delusions, illusions, hallucinations   Impression & Recommendations:  Problem # 1:  HYPOGONADISM (ICD-257.2) Assessment New start Axiron Orders: Venipuncture ([address]) TLB-Testosterone, Total (84403-TESTO) TLB-Luteinizing Hormone (LH) (83002-LH) TLB-FSH (Follicle Stimulating Hormone) (83001-FSH) TLB-Prolactin (84146-PROL)  Problem # 2:  LIBIDO, DECREASED (ICD-799.81) Assessment: Unchanged  Orders: Venipuncture ([address]) TLB-Testosterone, Total (84403-TESTO) TLB-Luteinizing Hormone (LH) (83002-LH) TLB-FSH (Follicle Stimulating Hormone) (83001-FSH) TLB-Prolactin (84146-PROL)  Problem # 3:  HYPERTENSION (ICD-401.9) Assessment: Improved  His updated medication list for this problem includes:    Losartan Potassium-hctz 50-12.5 Mg Tabs (Losartan potassium-hctz) ..... One by mouth once daily for high blood pressure  BP today: 110/70 Prior BP: 106/70 (05/27/2009)  Prior 10 Yr Risk Heart Disease: 7 % (05/27/2009)  Labs Reviewed: K+: 4.0 (05/27/2009) Creat: : 0.9 (05/27/2009)   Chol: 189 (05/22/2009)   HDL: 46.30 (05/22/2009)  LDL: 124 (05/22/2009)   TG: 93.0 (05/22/2009)  Complete Medication List: 1)  Aspirin 81 Mg Tabs (Aspirin) .Marland Kitchen.. 1 by mouth once daily 2)  Fish Oil 1200 Mg Caps (Omega-3 fatty acids) .Marland Kitchen.. 1 by mouth once daily 3)  Multivitamins Caps (Multiple vitamin) .Marland Kitchen.. 1 by mouth once daily 4)  Losartan Potassium-hctz 50-12.5 Mg Tabs (Losartan potassium-hctz) .... One by mouth once daily for high blood pressure 5)  Axiron 30 Mg  .... Apply one pump under each arm once daily  Patient Instructions: 1)  Please schedule a follow-up appointment in 2 months. 2)  Check your Blood Pressure regularly. If it is above 140/90: you should make an appointment.

## 2010-02-04 NOTE — Letter (Signed)
Summary: Results Follow-up Letter  [facility]  [address] [place], [place] [address]   Phone: [phone]  Fax: [phone]    04/29/2009  [address] [place], [place]  [address]  Dear Mr. STOKLOSA,   The following are the results of your recent test(s):  Test     Result     Blood sugar     a little high Potassium     slightly low Liver/kidney   normal CBC       normal Prostate     normal Thyroid     normal Urine       normal   _________________________________________________________  Please call for an appointment in 2-3 weeks _________________________________________________________ _________________________________________________________ _________________________________________________________  Sincerely,  Sanda Linger MD [facility]

## 2010-02-04 NOTE — Progress Notes (Signed)
Summary: Potassium  Phone Note Call from Patient Call back at [facility] Phone [phone]   Summary of Call: Pt stopped potassium b/c of side effects. He is req to know if there are dietary options that are available?  Initial call taken by: Lamar Sprinkles, CMA,  May 06, 2009 11:14 AM  Follow-up for Phone Call        lots of fresh fruits and veggies, see a diatician Follow-up by: Etta Grandchild MD,  May 06, 2009 11:16 AM  Additional Follow-up for Phone Call Additional follow up Details #1::        Pt informed  Additional Follow-up by: Lamar Sprinkles, CMA,  May 06, 2009 12:11 PM

## 2010-02-04 NOTE — Progress Notes (Signed)
Summary: Fatigue  Phone Note Call from Patient   Summary of Call: Pt is going out of town Monday for week long wilderness hike. He wants to know if results show reason for fatigue. If yes can this be addressed before next monday? Do you want to see pt for another office visit?  Initial call taken by: Lamar Sprinkles, CMA,  May 29, 2009 9:19 AM  Follow-up for Phone Call        labs show a slightly low testosterone which may cause fatigue, I would like to recheck it before treating it Follow-up by: Etta Grandchild MD,  May 29, 2009 9:24 AM  Additional Follow-up for Phone Call Additional follow up Details #1::        When to recheck? OV this week? Pt would like treatment prior to next monday.  Additional Follow-up by: Lamar Sprinkles, CMA,  May 29, 2009 11:53 AM    Additional Follow-up for Phone Call Additional follow up Details #2::    yes, this week will be fine Follow-up by: Etta Grandchild MD,  May 29, 2009 11:56 AM  Additional Follow-up for Phone Call Additional follow up Details #3:: Details for Additional Follow-up Action Taken: Pt informed, scheduled for tomorrow Additional Follow-up by: Lamar Sprinkles, CMA,  May 29, 2009 1:25 PM

## 2010-02-04 NOTE — Letter (Signed)
Summary: Pt cancelled appt/[facility]  Pt cancelled appt/[facility]   Imported By: Sherian Rein 07/10/2009 07:46:08  _____________________________________________________________________  External Attachment:    Type:   Image     Comment:   External Document

## 2010-02-04 NOTE — Progress Notes (Signed)
Summary: OV TODAY  Phone Note Call from Patient   Summary of Call: Patient [place] by [place]. Area healed per pt but now c/o redness slight swelling and itching. Scheduled for office visit today for eval.  Initial call taken by: Lamar Sprinkles, CMA,  July 16, 2009 11:54 AM

## 2010-02-04 NOTE — Assessment & Plan Note (Signed)
Summary: stung by stingray/redness/SD   Vital Signs:  Patient profile:   55 year old male Height:      72 inches Weight:      188 pounds BMI:     25.59 O2 Sat:      98 % on Room air Temp:     98.1 degrees F oral Pulse rate:   68 / minute BP sitting:   108 / 80  (left arm) Cuff size:   regular  Vitals Entered By: Zella Ball Ewing CMA (AAMA) (July 16, 2009 1:25 PM)  O2 Flow:  Room air CC: Stung bysting ray/RE   Primary Care Provider:  Etta Grandchild MD  CC:  Stung bysting ray/RE.  History of Present Illness: here after stung by stingray 1 wk ago, tx initially with scalding hot water bath per pt and seemed to do well until started some signficant itching last night, and nowwith  more localized red, swelling, and tender this am, ? better with ibuprofen overall and denies fever, chills, red streaks   Pt denies CP, sob, doe, wheezing, orthopnea, pnd, worsening LE edema, palps, dizziness or syncope  Pt denies new neuro symptoms such as headache, facial or extremity weakness   Problems Prior to Update: 1)  Cellulitis, Leg, Left  (ICD-682.6) 2)  Hypogonadism  (ICD-257.2) 3)  Hyperglycemia, Borderline  (ICD-790.29) 4)  Hypokalemia  (ICD-276.8) 5)  Libido, Decreased  (ICD-799.81) 6)  Hypertriglyceridemia  (ICD-272.1) 7)  Routine General Medical Exam@[facility]  (ICD-V70.0) 8)  Hypertension  (ICD-401.9) 9)  Colonic Polyps, Hx of  (ICD-V12.72)  Medications Prior to Update: 1)  Aspirin 81 Mg Tabs (Aspirin) .Marland Kitchen.. 1 By Mouth Once Daily 2)  Fish Oil 1200 Mg Caps (Omega-3 Fatty Acids) .Marland Kitchen.. 1 By Mouth Once Daily 3)  Multivitamins  Caps (Multiple Vitamin) .Marland Kitchen.. 1 By Mouth Once Daily 4)  Losartan Potassium-Hctz 50-12.5 Mg Tabs (Losartan Potassium-Hctz) .... One By Mouth Once Daily For High Blood Pressure 5)  Axiron 30 Mg .... Apply One Pump Under Each Arm Once Daily  Current Medications (verified): 1)  Aspirin 81 Mg Tabs (Aspirin) .Marland Kitchen.. 1 By Mouth Once Daily 2)  Fish Oil 1200 Mg Caps  (Omega-3 Fatty Acids) .Marland Kitchen.. 1 By Mouth Once Daily 3)  Multivitamins  Caps (Multiple Vitamin) .Marland Kitchen.. 1 By Mouth Once Daily 4)  Losartan Potassium-Hctz 50-12.5 Mg Tabs (Losartan Potassium-Hctz) .... One By Mouth Once Daily For High Blood Pressure 5)  Axiron 30 Mg .... Apply One Pump Under Each Arm Once Daily 6)  Doxycycline Hyclate 100 Mg Caps (Doxycycline Hyclate) .Marland Kitchen.. 1po Two Times A Day  Allergies (verified): 1)  ! 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  , non toxic Head:  normocephalic and atraumatic.   Eyes:  vision grossly intact, pupils equal, and pupils round.   Ears:  R ear normal and L ear normal.  Nose:  no external deformity and no nasal discharge.   Mouth:  no gingival abnormalities and pharynx pink and moist.   Neck:  supple and no masses.   Lungs:  normal respiratory effort and normal breath sounds.   Heart:  normal rate and regular rhythm.   Msk:  left medial heel with 2 cm area mild erythema, tender and swelling with central envonomation site;  no fluctuance, no red streaks or other signficant other s/s infection to foot, ankle, leg Extremities:  no edema, no erythema    Impression & Recommendations:  Problem # 1:  CELLULITIS, LEG, LEFT (ICD-682.6)  His updated medication list for this problem includes:     Doxycycline Hyclate 100 Mg Caps (Doxycycline hyclate) .Marland Kitchen... 1po two times a day treat as above, f/u any worsening signs or symptoms , mild, early onset  Problem # 2:  HYPERTENSION (ICD-401.9)  His updated medication list for this problem includes:    Losartan Potassium-hctz 50-12.5 Mg Tabs (Losartan potassium-hctz) ..... One by mouth once daily for high blood pressure  BP today: 108/80 Prior BP: 110/70 (05/30/2009)  Prior 10 Yr Risk Heart Disease: 7 % (05/27/2009)  Labs Reviewed: K+: 4.0 (05/27/2009) Creat: : 0.9 (05/27/2009)   Chol: 189 (05/22/2009)   HDL: 46.30 (05/22/2009)   LDL: 124 (05/22/2009)   TG: 93.0 (05/22/2009) stable overall by hx and exam, ok to continue meds/tx as is   Complete Medication List: 1)  Aspirin 81 Mg Tabs (Aspirin) .Marland Kitchen.. 1 by mouth once daily 2)  Fish Oil 1200 Mg Caps (Omega-3 fatty acids) .Marland Kitchen.. 1 by mouth once daily 3)  Multivitamins Caps (Multiple vitamin) .Marland Kitchen.. 1 by mouth once daily 4)  Losartan Potassium-hctz 50-12.5 Mg Tabs (Losartan potassium-hctz) .... One by mouth once daily for high blood pressure 5)  Axiron 30 Mg  .... Apply one pump under each arm once daily 6)  Doxycycline Hyclate 100 Mg Caps (Doxycycline hyclate) .Marland Kitchen.. 1po two times a day  Patient Instructions: 1)  Please take all new medications as prescribed 2)  Continue all previous medications as before this visit  3)  Please schedule an appointment with your primary doctor as needed Prescriptions: DOXYCYCLINE HYCLATE 100 MG CAPS (DOXYCYCLINE HYCLATE) 1po two times a day  #20 x 0   Entered and Authorized by:   Corwin Levins MD   Signed by:   Corwin Levins MD on 07/16/2009   Method used:   Electronically to        [facility] [address] [phone]* (retail)       [address] [place], [place]  [address]       Ph: [phone]       Fax: [phone]   RxID:   [id number]

## 2010-02-04 NOTE — Progress Notes (Signed)
  Phone Note Call from Patient   Summary of Call: Pt called b/c he needs lipids rechecked, last labs were not fasting. Also has concerns about fatigue and questions about testosterone. Scheduled pt for f/u office visit.  Initial call taken by: Lamar Sprinkles, CMA,  May 21, 2009 2:20 PM

## 2010-02-04 NOTE — Assessment & Plan Note (Signed)
Summary: NEW PT [employer] PHYSICAL--PKG/OFF--STC   Vital Signs:  Patient profile:   55 year old male Height:      72 inches (182.88 cm) Weight:      192.25 pounds (87.39 kg) BMI:     26.17 O2 Sat:      97 % on Room air Temp:     97.5 degrees F (36.39 degrees C) oral Pulse rate:   64 / minute Pulse rhythm:   regular Resp:     16 per minute BP sitting:   118 / 80  (left arm) Cuff size:   regular  Vitals Entered By: Brenton Grills (April 25, 2009 2:46 PM)  Nutrition Counseling: Patient's BMI is greater than 25 and therefore counseled on weight management options.  O2 Flow:  Room air CC: new pt also here for physical/pt may be due for tetanus/aj, Hypertension Management, Preventive Care   Primary Care Provider:  Etta Grandchild MD  CC:  new pt also here for physical/pt may be due for tetanus/aj, Hypertension Management, and Preventive Care.  History of Present Illness: New to me for a complete physical.  Hypertension History:      He denies headache, chest pain, palpitations, dyspnea with exertion, orthopnea, PND, peripheral edema, visual symptoms, neurologic problems, syncope, and side effects from treatment.  He notes no problems with any antihypertensive medication side effects.        Positive major cardiovascular risk factors include male age 15 years old or older, hypertension, and family history for ischemic heart disease (males less than 75 years old).  Negative major cardiovascular risk factors include no history of diabetes or hyperlipidemia and non-tobacco-user status.        Further assessment for target organ damage reveals no history of ASHD, cardiac end-organ damage (CHF/LVH), stroke/TIA, peripheral vascular disease, renal insufficiency, or hypertensive retinopathy.     Preventive Screening-Counseling & Management  Alcohol-Tobacco     Alcohol drinks/day: 0     Smoking Status: quit  Caffeine-Diet-Exercise     Does Patient Exercise: yes  Hep-HIV-STD-Contraception    Hepatitis Risk: no risk noted     HIV Risk: no risk noted     STD Risk: no risk noted     SBE monthly: yes     SBE Education/Counseling: to perform regular SBE      Sexual History:  currently monogamous.        Drug Use:  no.        Blood Transfusions:  no.    Current Medications (verified): 1)  Aspirin 81 Mg Tabs (Aspirin) .Marland Kitchen.. 1 By Mouth Once Daily 2)  Fish Oil 1200 Mg Caps (Omega-3 Fatty Acids) .Marland Kitchen.. 1 By Mouth Once Daily 3)  Multivitamins  Caps (Multiple Vitamin) .Marland Kitchen.. 1 By Mouth Once Daily  Allergies (verified): 1)  ! 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 Regular exercise-yes Smoking Status:  quit Hepatitis Risk:  no risk noted HIV Risk:  no risk noted STD Risk:  no risk noted Sexual History:  currently monogamous Blood Transfusions:  no Drug Use:  no Does Patient Exercise:  yes  Review of Systems  The patient denies anorexia, fever, weight loss, weight gain, chest pain,  syncope, dyspnea on exertion, peripheral edema, prolonged cough, headaches, hemoptysis, abdominal pain, melena, hematochezia, severe indigestion/heartburn, hematuria, suspicious skin lesions, difficulty walking, depression, enlarged lymph nodes, angioedema, and testicular masses.    Physical Exam  General:  alert, well-developed, well-nourished, well-hydrated, appropriate dress, normal appearance, healthy-appearing, cooperative to examination, and good hygiene.   Head:  normocephalic, atraumatic, no abnormalities observed, and no abnormalities palpated.   Eyes:  vision grossly intact, pupils equal, pupils round, and pupils reactive to light.   Mouth:  Oral mucosa and  oropharynx without lesions or exudates.  Teeth in good repair. Neck:  supple, full ROM, no masses, no thyromegaly, no thyroid nodules or tenderness, no JVD, normal carotid upstroke, no carotid bruits, no cervical lymphadenopathy, and no neck tenderness.   Chest Wall:  no deformities, no tenderness, and no masses.   Breasts:  No masses or gynecomastia noted Lungs:  Normal respiratory effort, chest expands symmetrically. Lungs are clear to auscultation, no crackles or wheezes. Heart:  Normal rate and regular rhythm. S1 and S2 normal without gallop, murmur, click, rub or other extra sounds. Abdomen:  soft, non-tender, normal bowel sounds, no distention, no masses, no guarding, no rigidity, no rebound tenderness, no abdominal hernia, no inguinal hernia, no hepatomegaly, no splenomegaly, and abdominal scar(s).   Rectal:  No external abnormalities noted. Normal sphincter tone. No rectal masses or tenderness. heme negative stool. Genitalia:  circumcised, no hydrocele, no varicocele, no scrotal masses, no testicular masses or atrophy, no cutaneous lesions, and no urethral discharge.   Prostate:  no nodules, no asymmetry, no induration, and 1+ enlarged.   Msk:  No deformity or scoliosis noted of thoracic or lumbar spine.   Pulses:  R and L carotid,radial,femoral,dorsalis pedis and posterior tibial pulses are full and equal bilaterally Extremities:  No clubbing, cyanosis, edema, or deformity noted with normal full range of motion of all joints.   Neurologic:  No cranial nerve deficits noted. Station and gait are normal. Plantar reflexes are down-going bilaterally. DTRs are symmetrical throughout. Sensory, motor and coordinative functions appear intact. Skin:  turgor normal, color normal, no rashes, no suspicious lesions, no ecchymoses, no petechiae, no purpura, no ulcerations, no edema, and tattoo(s).   Cervical Nodes:  no anterior cervical adenopathy and no posterior cervical adenopathy.   Axillary Nodes:  no  R axillary adenopathy and no L axillary adenopathy.   Inguinal Nodes:  no R inguinal adenopathy and no L inguinal adenopathy.   Psych:  Cognition and judgment appear intact. Alert and cooperative with normal attention span and concentration. No apparent delusions, illusions, hallucinations Additional Exam:  EKG is normal.   Impression & Recommendations:  Problem # 1:  ROUTINE GENERAL MEDICAL EXAM@HEALTH  CARE FACL (ICD-V70.0) Assessment New  Orders: Venipuncture (16109) TLB-Lipid Panel (80061-LIPID) TLB-BMP (Basic Metabolic Panel-BMET) (80048-METABOL) TLB-CBC Platelet - w/Differential (85025-CBCD) TLB-Hepatic/Liver Function Pnl (80076-HEPATIC) TLB-TSH (Thyroid Stimulating Hormone) (84443-TSH) TLB-PSA (Prostate Specific Antigen) (84153-PSA) TLB-Udip w/ Micro (81001-URINE) EKG w/ Interpretation (93000) Hemoccult Guaiac-1 spec.(in office) (82270)  Colonoscopy: 1) 4 mm diminutive polyp in the ascending colon, removed ADENOMA 2) Otherwise normal examination, good prep  (04/24/2008) Next Colonoscopy due:: 05/2013 (04/24/2008)  Discussed using sunscreen, use of alcohol, drug use, self testicular exam, routine dental care, routine eye care, routine physical exam, seat belts, multiple vitamins,  and recommendations for immunizations.  Discussed exercise and checking cholesterol.  Discussed gun safety and  safe sex. Also recommend checking PSA.  Problem # 2:  HYPERTENSION (ICD-401.9) Assessment: Improved  His updated medication list for this  problem includes:    Losartan Potassium-hctz 50-12.5 Mg Tabs (Losartan potassium-hctz) ..... One by mouth once daily for high blood pressure  Orders: Venipuncture (27035) TLB-Lipid Panel (80061-LIPID) TLB-BMP (Basic Metabolic Panel-BMET) (80048-METABOL) TLB-CBC Platelet - w/Differential (85025-CBCD) TLB-Hepatic/Liver Function Pnl (80076-HEPATIC) TLB-TSH (Thyroid Stimulating Hormone) (84443-TSH) TLB-PSA (Prostate Specific Antigen)  (84153-PSA) TLB-Udip w/ Micro (81001-URINE) EKG w/ Interpretation (93000)  BP today: 118/80  10 Yr Risk Heart Disease: Not enough information  Complete Medication List: 1)  Aspirin 81 Mg Tabs (Aspirin) .Marland Kitchen.. 1 by mouth once daily 2)  Fish Oil 1200 Mg Caps (Omega-3 fatty acids) .Marland Kitchen.. 1 by mouth once daily 3)  Multivitamins Caps (Multiple vitamin) .Marland Kitchen.. 1 by mouth once daily 4)  Losartan Potassium-hctz 50-12.5 Mg Tabs (Losartan potassium-hctz) .... One by mouth once daily for high blood pressure  Other Orders: Tdap => 77yrs IM (00938) Admin 1st Vaccine (18299)  Hypertension Assessment/Plan:      The patient's hypertensive risk group is category B: At least one risk factor (excluding diabetes) with no target organ damage.  Today's blood pressure is 118/80.  His blood pressure goal is < 140/90.  Colorectal Screening:  Current Recommendations:    Hemoccult: NEG X 1 today  PSA Screening:    Reviewed PSA screening recommendations: PSA ordered  Immunization & Chemoprophylaxis:    Tetanus vaccine: Tdap  (04/25/2009)  Patient Instructions: 1)  Please schedule a follow-up appointment in 6 months. 2)  Check your Blood Pressure regularly. If it is above 130/80: you should make an appointment.     Orders Added: 1)  Venipuncture [36415] 2)  TLB-Lipid Panel [80061-LIPID] 3)  TLB-BMP (Basic Metabolic Panel-BMET) [80048-METABOL] 4)  TLB-CBC Platelet - w/Differential [85025-CBCD] 5)  TLB-Hepatic/Liver Function Pnl [80076-HEPATIC] 6)  TLB-TSH (Thyroid Stimulating Hormone) [84443-TSH] 7)  TLB-PSA (Prostate Specific Antigen) [84153-PSA] 8)  TLB-Udip w/ Micro [81001-URINE] 9)  EKG w/ Interpretation [93000] 10)  Hemoccult Guaiac-1 spec.(in office) [82270] 11)  New Patient 40-64 years [99386] 12)  Tdap => 41yrs IM [90715] 13)  Admin 1st Vaccine [90471]    Immunizations Administered:  Tetanus Vaccine:    Vaccine Type: Tdap    Site: right deltoid    Mfr: GlaxoSmithKline    Dose: 0.5  ml    Route: IM    Given by: Brenton Grills    Exp. Date: 03/30/2011    Lot #: [id number]    VIS given: 11/23/06 version given April 25, 2009.

## 2010-02-06 NOTE — Assessment & Plan Note (Signed)
Summary: ov to discuss testosterone and insomnia issues/ ab   Vital Signs:  Patient profile:   55 year old male Height:      72 inches Weight:      194 pounds BMI:     26.41 O2 Sat:      97 % on Room air Temp:     98.6 degrees F oral Pulse rate:   67 / minute Pulse rhythm:   regular Resp:     16 per minute BP sitting:   120 / 70  (left arm) Cuff size:   large  Vitals Entered By: Rock Nephew CMA (January 13, 2010 9:33 AM)  Nutrition Counseling: Patient's BMI is greater than 25 and therefore counseled on weight management options.  O2 Flow:  Room air CC: Patient c/o slepping difficulty, Insomnia, Hypertension Management Is Patient Diabetic? No Pain Assessment Patient in pain? no       Does patient need assistance? Functional Status Self care Ambulation Normal   Primary Care Provider:  Etta Grandchild MD  CC:  Patient c/o slepping difficulty, Insomnia, and Hypertension Management.  History of Present Illness:  Insomnia      This is a 55 year old man who presents with Insomnia.  The symptoms began 4-5 years ago.  The severity is described as mild.  The patient reports difficulty falling asleep, frequent awakening, and early awakening, but denies nightmares, leg movements, snoring, apnea noted by partner, and daytime somnolence.  Past treatments that have been effective include behavior modification and relaxation techniques.    Hypertension History:      He denies headache, chest pain, palpitations, dyspnea with exertion, orthopnea, PND, peripheral edema, visual symptoms, neurologic problems, syncope, and side effects from treatment.  He notes no problems with any antihypertensive medication side effects.        Positive major cardiovascular risk factors include male age 55 years old or older, hyperlipidemia, hypertension, and family history for ischemic heart disease (males less than 55 years old).  Negative major cardiovascular risk factors include no history of  diabetes and non-tobacco-user status.        Further assessment for target organ damage reveals no history of ASHD, cardiac end-organ damage (CHF/LVH), stroke/TIA, peripheral vascular disease, renal insufficiency, or hypertensive retinopathy.     Preventive Screening-Counseling & Management  Alcohol-Tobacco     Alcohol drinks/day: 0     Alcohol Counseling: not indicated; patient does not drink     Smoking Status: quit     Tobacco Counseling: not to resume use of tobacco products  Hep-HIV-STD-Contraception     Hepatitis Risk: no risk noted     HIV Risk: no risk noted     STD Risk: no risk noted     SBE monthly: yes     SBE Education/Counseling: to perform regular SBE  Current Medications (verified): 1)  Aspirin 81 Mg Tabs (Aspirin) .Marland Kitchen.. 1 By Mouth Once Daily 2)  Fish Oil 1200 Mg Caps (Omega-3 Fatty Acids) .Marland Kitchen.. 1 By Mouth Once Daily 3)  Multivitamins  Caps (Multiple Vitamin) .Marland Kitchen.. 1 By Mouth Once Daily 4)  Losartan Potassium-Hctz 50-12.5 Mg Tabs (Losartan Potassium-Hctz) .... One By Mouth Once Daily For High Blood Pressure 5)  Axiron 30 Mg .... Apply One Pump Under Each Arm Once Daily  Allergies (verified): 1)  ! 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 n/a  Social History: Reviewed history from 04/25/2009  and no changes required. 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   Psych:  Denies anxiety, depression, easily angered, easily tearful, irritability, mental problems, panic attacks, sense of great danger, suicidal thoughts/plans, and thoughts of violence.  Physical Exam  General:  alert, well-developed, well-nourished, well-hydrated, appropriate dress, normal appearance, healthy-appearing, cooperative to examination, and good hygiene.   Head:  normocephalic, atraumatic, no abnormalities observed, and no abnormalities palpated.   Eyes:  vision grossly intact and pupils equal.   Mouth:  no gingival abnormalities and pharynx pink and moist.   Neck:  supple, full ROM, no masses, no thyromegaly, no JVD, normal carotid upstroke, no carotid bruits, no cervical lymphadenopathy, and no neck tenderness.   Lungs:  normal respiratory effort, no intercostal retractions, no accessory muscle use, normal breath sounds, no dullness, no fremitus, no crackles, and no wheezes.   Heart:  normal rate, regular rhythm, no murmur, no gallop, no rub, and no JVD.   Abdomen:  soft, non-tender, normal bowel sounds, no distention, no masses, no guarding, no rigidity, no rebound tenderness, no abdominal hernia, no inguinal hernia, no hepatomegaly, and no splenomegaly.   Msk:  No deformity or scoliosis noted of thoracic or lumbar spine.   Pulses:  R and L carotid,radial,femoral,dorsalis pedis and posterior tibial pulses are full and equal bilaterally Extremities:  No clubbing, cyanosis, edema, or deformity noted with normal full range of motion of all  joints.   Neurologic:  No cranial nerve deficits noted. Station and gait are normal. Plantar reflexes are down-going bilaterally. DTRs are symmetrical throughout. Sensory, motor and coordinative functions appear intact. Skin:  Intact without suspicious lesions or rashes Cervical Nodes:  no anterior cervical adenopathy and no posterior cervical adenopathy.   Axillary Nodes:  no R axillary adenopathy and no L axillary adenopathy.   Psych:  Cognition and judgment appear intact. Alert and cooperative with normal attention span and concentration. No apparent delusions, illusions, hallucinations   Impression & Recommendations:  Problem # 1:  INSOMNIA-SLEEP DISORDER-UNSPEC (ICD-780.52) Assessment New  He will try trazodone as needed   Discussed sleep hygiene.   Problem # 2:  HYPERTENSION (ICD-401.9) Assessment: Improved  His updated medication list for this problem includes:    Losartan Potassium-hctz 50-12.5 Mg Tabs (Losartan potassium-hctz) ..... One by mouth once daily for high blood pressure  BP today: 120/70 Prior BP: 108/80 (07/16/2009)  Prior 10 Yr Risk Heart Disease: 7 % (05/27/2009)  Labs Reviewed: K+: 4.0 (05/27/2009) Creat: : 0.9 (05/27/2009)   Chol: 189 (05/22/2009)   HDL: 46.30 (05/22/2009)   LDL: 124 (05/22/2009)   TG: 93.0 (05/22/2009)  Complete Medication List: 1)  Aspirin 81 Mg Tabs (Aspirin) .Marland Kitchen.. 1 by mouth once daily 2)  Fish Oil 1200 Mg Caps (Omega-3 fatty acids) .Marland Kitchen.. 1 by mouth once daily 3)  Multivitamins Caps (Multiple vitamin) .Marland Kitchen.. 1 by mouth once daily 4)  Losartan Potassium-hctz 50-12.5 Mg Tabs (Losartan potassium-hctz) .... One by mouth once daily for high blood pressure 5)  Axiron 30 Mg  .... Apply one pump under each arm once daily 6)  Trazodone Hcl 50 Mg Tabs (Trazodone hcl) .Marland Kitchen.. 1-2 by mouth at bedtime as needed for insomnia  Other Orders: Flu Vaccine 35yrs + (04540) Admin 1st Vaccine (98119)  Hypertension Assessment/Plan:      The patient's  hypertensive risk group is category B: At least one risk factor (excluding diabetes) with no target organ damage.  His calculated 10 year risk of coronary heart disease is 7 %.  Today's blood pressure is 120/70.  His blood pressure goal is < 140/90.  Patient Instructions: 1)  Please schedule a follow-up appointment in 3 months. 2)  It is important that you exercise regularly at least 20 minutes 5 times a week. If you develop chest pain, have severe difficulty breathing, or feel very tired , stop exercising immediately and seek medical attention. 3)  Check your Blood Pressure regularly. If it is above 140/90: you should make an appointment. Prescriptions: TRAZODONE HCL 50 MG TABS (TRAZODONE HCL) 1-2 by mouth at bedtime as needed for insomnia  #60 x 11   Entered and Authorized by:   Etta Grandchild MD   Signed by:   Etta Grandchild MD on 01/13/2010   Method used:   Electronically to        [facility]. [phone]* (retail)       [address]       [place], [place]  [address]       Ph: [phone]       Fax: [phone]   RxID:   [id number]    Orders Added: 1)  Flu Vaccine 73yrs + [47425] 2)  Admin 1st Vaccine [90471] 3)  Est. Patient Level IV [95638]   Immunizations Administered:  Influenza Vaccine # 1:    Vaccine Type: Fluvax 3+    Site: left deltoid    Mfr: Sanofi Pasteur    Dose: 0.5 ml    Route: IM    Given by: Rock Nephew CMA    Exp. Date: 07/05/2010    Lot #: [id number]    VIS given: 07/30/09 version given January 13, 2010.   Immunizations Administered:  Influenza Vaccine # 1:    Vaccine Type: Fluvax 3+    Site: left deltoid    Mfr: Sanofi Pasteur    Dose: 0.5 ml    Route: IM    Given by: Rock Nephew CMA    Exp. Date: 07/05/2010    Lot #: [id number]    VIS given: 07/30/09 version given January 13, 2010.

## 2010-03-04 ENCOUNTER — Telehealth: Payer: Self-pay | Admitting: Internal Medicine

## 2010-03-07 ENCOUNTER — Encounter: Payer: Self-pay | Admitting: Internal Medicine

## 2010-03-07 ENCOUNTER — Ambulatory Visit (INDEPENDENT_AMBULATORY_CARE_PROVIDER_SITE_OTHER): Payer: BC Managed Care – PPO | Admitting: Internal Medicine

## 2010-03-07 DIAGNOSIS — S61209A Unspecified open wound of unspecified finger without damage to nail, initial encounter: Secondary | ICD-10-CM

## 2010-03-11 ENCOUNTER — Telehealth: Payer: Self-pay | Admitting: Internal Medicine

## 2010-03-13 NOTE — Progress Notes (Signed)
  Phone Note Refill Request Message from:  Fax from Pharmacy on March 04, 2010 10:05 AM  Refills Requested: Medication #1:  LOSARTAN POTASSIUM-HCTZ 50-12.5 MG TABS One by mouth once daily for high blood pressure   Dosage confirmed as above?Dosage Confirmed   Supply Requested: 3 months  Medication #2:  TRAZODONE HCL 50 MG TABS 1-2 by mouth at bedtime as needed for insomnia.   Dosage confirmed as above?Dosage Confirmed   [facility] Requested[facility]  Initial call taken by: Rock Nephew CMA,  March 04, 2010 10:05 AM    Prescriptions: TRAZODONE HCL 50 MG TABS (TRAZODONE HCL) 1-2 by mouth at bedtime as needed for insomnia  #38mo x 3   Entered by:   Rock Nephew CMA   Authorized by:   Etta Grandchild MD   Signed by:   Rock Nephew CMA on 03/04/2010   Method used:   Faxed to ...       [facility] F[facility]) (mail-order)             ,          Ph: [phone]       Fax: [phone]   RxID:   [id number] LOSARTAN POTASSIUM-HCTZ 50-12.5 MG TABS (LOSARTAN POTASSIUM-HCTZ) One by mouth once daily for high blood pressure  #90 x 3   Entered by:   Rock Nephew CMA   Authorized by:   Etta Grandchild MD   Signed by:   Rock Nephew CMA on 03/04/2010   Method used:   Faxed to ...       [facility] Fax) (mail-order)             ,          Ph: [phone]       Fax: [phone]   RxID:   [id number]

## 2010-03-14 ENCOUNTER — Encounter: Payer: Self-pay | Admitting: Internal Medicine

## 2010-03-14 ENCOUNTER — Ambulatory Visit (INDEPENDENT_AMBULATORY_CARE_PROVIDER_SITE_OTHER): Payer: BC Managed Care – PPO | Admitting: Internal Medicine

## 2010-03-14 DIAGNOSIS — S61209A Unspecified open wound of unspecified finger without damage to nail, initial encounter: Secondary | ICD-10-CM

## 2010-03-18 NOTE — Progress Notes (Signed)
Summary: refill  Phone Note Refill Request Message from:  Pharmacy on March 11, 2010 8:59 AM  Refills Requested: Medication #1:  AXIRON 30 MG Apply one pump under each arm once daily Is this ok to refill  Initial call taken by: Rock Nephew CMA,  March 11, 2010 8:59 AM  Follow-up for Phone Call        yes Follow-up by: Etta Grandchild MD,  March 11, 2010 9:08 AM    Prescriptions: Randa Ngo 30 MG Apply one pump under each arm once daily  #20mili x 11   Entered by:   Rock Nephew CMA   Authorized by:   Etta Grandchild MD   Signed by:   Rock Nephew CMA on 03/11/2010   Method used:   Telephoned to ...       [facility]. [phone]* (retail)       [address]       [place], [place]  [address]       Ph: [phone]       Fax: [phone]   RxID:   [id number]

## 2010-03-18 NOTE — Assessment & Plan Note (Signed)
Summary: cut left thumb/cd   Vital Signs:  Patient profile:   55 year old male Height:      72 inches Weight:      194 pounds BMI:     26.41 O2 Sat:      95 % on Room air Temp:     98.5 degrees F oral Pulse rate:   77 / minute Pulse rhythm:   regular Resp:     16 per minute BP sitting:   128 / 84  (right arm) Cuff size:   large  Vitals Entered By: Bill Salinas CMA (March 07, 2010 1:42 PM)  Nutrition Counseling: Patient's BMI is greater than 25 and therefore counseled on weight management options.  O2 Flow:  Room air  Primary Care Provider:  Etta Grandchild MD   History of Present Illness: Patient walked in stating that he hurt his left thumb just prior to arrival. He says that a sharp blade used in plumbing gave him a glancing blow to his thumb causing a laceration. There was immediate pain and bleeding but no evidence of a foreign body. He has not had any numbness, weakness, or tingling in the thumb. He tells me that the thumb has normal range of wotion.  Preventive Screening-Counseling & Management  Alcohol-Tobacco     Alcohol drinks/day: 0     Alcohol Counseling: not indicated; patient does not drink     Smoking Status: quit     Tobacco Counseling: not to resume use of tobacco products  Hep-HIV-STD-Contraception     Hepatitis Risk: no risk noted     HIV Risk: no risk noted     STD Risk: no risk noted     SBE monthly: yes     SBE Education/Counseling: to perform regular SBE      Sexual History:  currently monogamous.        Drug Use:  no.        Blood Transfusions:  no.    Clinical Review Panels:  Prevention   Last Colonoscopy:  1) 4 mm diminutive polyp in the ascending colon, removed ADENOMA 2) Otherwise normal examination, good prep (04/24/2008)   Last PSA:  0.23 (04/25/2009)  Immunizations   Last Tetanus Booster:  Tdap (04/25/2009)   Last Flu Vaccine:  Fluvax 3+ (01/13/2010)  Lipid Management   Cholesterol:  189 (05/22/2009)   LDL (bad choesterol):   124 (05/22/2009)   HDL (good cholesterol):  46.30 (05/22/2009)  Diabetes Management   HgBA1C:  5.6 (05/27/2009)   Creatinine:  0.9 (05/27/2009)   Last Flu Vaccine:  Fluvax 3+ (01/13/2010)  CBC   WBC:  7.0 (04/25/2009)   RBC:  4.64 (04/25/2009)   Hgb:  14.2 (04/25/2009)   Hct:  40.0 (04/25/2009)   Platelets:  309.0 (04/25/2009)   MCV  86.2 (04/25/2009)   MCHC  35.6 (04/25/2009)   RDW  12.9 (04/25/2009)   PMN:  56.2 (04/25/2009)   Lymphs:  29.8 (04/25/2009)   Monos:  8.3 (04/25/2009)   Eosinophils:  5.0 (04/25/2009)   Basophil:  0.7 (04/25/2009)  Complete Metabolic Panel   Glucose:  108 (05/27/2009)   Sodium:  138 (05/27/2009)   Potassium:  4.0 (05/27/2009)   Chloride:  105 (05/27/2009)   CO2:  28 (05/27/2009)   BUN:  15 (05/27/2009)   Creatinine:  0.9 (05/27/2009)   Albumin:  4.1 (04/25/2009)   Total Protein:  6.2 (04/25/2009)   Calcium:  8.8 (05/27/2009)   Total Bili:  0.1 (04/25/2009)  Alk Phos:  41 (04/25/2009)   SGPT (ALT):  22 (04/25/2009)   SGOT (AST):  19 (04/25/2009)   Medications Prior to Update: 1)  Aspirin 81 Mg Tabs (Aspirin) .Marland Kitchen.. 1 By Mouth Once Daily 2)  Fish Oil 1200 Mg Caps (Omega-3 Fatty Acids) .Marland Kitchen.. 1 By Mouth Once Daily 3)  Multivitamins  Caps (Multiple Vitamin) .Marland Kitchen.. 1 By Mouth Once Daily 4)  Losartan Potassium-Hctz 50-12.5 Mg Tabs (Losartan Potassium-Hctz) .... One By Mouth Once Daily For High Blood Pressure 5)  Axiron 30 Mg .... Apply One Pump Under Each Arm Once Daily 6)  Trazodone Hcl 50 Mg Tabs (Trazodone Hcl) .Marland Kitchen.. 1-2 By Mouth At Bedtime As Needed For Insomnia  Current Medications (verified): 1)  Aspirin 81 Mg Tabs (Aspirin) .Marland Kitchen.. 1 By Mouth Once Daily 2)  Fish Oil 1200 Mg Caps (Omega-3 Fatty Acids) .Marland Kitchen.. 1 By Mouth Once Daily 3)  Multivitamins  Caps (Multiple Vitamin) .Marland Kitchen.. 1 By Mouth Once Daily 4)  Losartan Potassium-Hctz 50-12.5 Mg Tabs (Losartan Potassium-Hctz) .... One By Mouth Once Daily For High Blood Pressure 5)  Axiron 30 Mg ....  Apply One Pump Under Each Arm Once Daily 6)  Trazodone Hcl 50 Mg Tabs (Trazodone Hcl) .Marland Kitchen.. 1-2 By Mouth At Bedtime As Needed For Insomnia 7)  Augmentin 500-125 Mg Tabs (Amoxicillin-Pot Clavulanate) .... One By Mouth Three Times A Day For 7 Days  Allergies (verified): 1)  ! 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 n/a  Social History: Reviewed history from 04/25/2009 and no changes required. 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 Neuro:  Denies numbness, tingling, and weakness.  Physical Exam  Msk:  on the ulnar side of the left thumb at the IP joint there is a 1.5 cm laceration that is full thickness but it does not expose any deep structures. there is minimal bleeding - oozing but no arterial bleeding. There is no FB. There is no tendon involvement. He has 5/5 flexion and extension at the MCP and IP joints. Distally there is good sensation and capillary refill. There is no ttp. Pulses:  L radial normal.   Additional Exam:  the area of the left thumb was cleaned with diluted betadine and is was prepped  and draped in serile fashion. the would was scrubbes with a 4X4 gauze and it was irrigated with 100 cc of sterile NS. No additional injuried were found. The edges of the laceration were anesthetized with 2% plain lidocaine. Anesthesia was obtained. The edges of the laceration were approximated with 3 simple interrupted sutures using 5.0 nylon on a PC-3. The closure effect is good and there is good hemostasis. He continues to have good flexion and extension at the IP joint and there is good sensation  and capillary refill distally. He tolerated the repair well. The would was covered with neosporin and a dressing was applied.   Impression & Recommendations:  Problem # 1:  LACERATION, LEFT THUMB (ICD-883.0) Assessment New  start Augmentin to prevent wound infection.  Orders: Lacerat Intermd STE 0 - 2.5 cm (81191)  Complete Medication List: 1)  Aspirin 81 Mg Tabs (Aspirin) .Marland Kitchen.. 1 by mouth once daily 2)  Fish Oil 1200 Mg Caps (Omega-3 fatty acids) .Marland Kitchen.. 1 by mouth once daily 3)  Multivitamins Caps (Multiple vitamin) .Marland Kitchen.. 1 by mouth once daily 4)  Losartan Potassium-hctz 50-12.5 Mg Tabs (Losartan potassium-hctz) .... One by mouth once daily for high blood pressure 5)  Axiron 30 Mg  .... Apply one pump under each arm once daily 6)  Trazodone Hcl 50 Mg Tabs (Trazodone hcl) .Marland Kitchen.. 1-2 by mouth at bedtime as needed for insomnia 7)  Augmentin 500-125 Mg Tabs (Amoxicillin-pot clavulanate) .... One by mouth three times a day for 7 days  Patient Instructions: 1)  Please schedule a follow-up appointment in 1 weeks. 2)  Take 650-1000mg  of Tylenol every 4-6 hours as needed for relief of pain or comfort of fever AVOID taking more than 4000mg   in a 24 hour period (can cause liver damage in higher doses). 3)  Take 400-600mg  of Ibuprofen (Advil, Motrin) with food every 4-6 hours as needed for relief of pain or comfort of fever. 4)  Take your antibiotic as prescribed until ALL of it is gone, but stop if you develop  a rash or swelling and contact our office as soon as possible. 5)  Keep the wound clean, dry, and protected. Prescriptions: AUGMENTIN 500-125 MG TABS (AMOXICILLIN-POT CLAVULANATE) One by mouth three times a day for 7 days  #21 x 0   Entered by:   Ami Bullins CMA   Authorized by:   Etta Grandchild MD   Signed by:   Bill Salinas CMA on 03/07/2010   Method used:   Print then Give to Patient   RxID:   [id number] AUGMENTIN 500-125 MG TABS (AMOXICILLIN-POT CLAVULANATE) One by mouth three times a day for 7 days  #21 x 0   Entered and Authorized by:   Etta Grandchild MD   Signed by:   Etta Grandchild MD on 03/07/2010   Method used:   Electronically to        [facility] [address] [phone]* (retail)       [address] [place], [place]  [address]       Ph: [phone]       Fax: [phone]   RxID:   [id number]    Orders Added: 1)  Est. Patient Level II [95[address]] 2)  Lacerat Intermd STE 0 - 2.5 cm [12031]

## 2010-03-25 NOTE — Assessment & Plan Note (Signed)
Summary: 1 WK FU---STC   Vital Signs:  Patient profile:   55 year old male Height:      72 inches Weight:      193 pounds BMI:     26.27 O2 Sat:      97 % on Room air Temp:     97.9 degrees F oral Pulse rate:   72 / minute Pulse rhythm:   regular Resp:     16 per minute BP sitting:   120 / 82  (left arm) Cuff size:   large  Vitals Entered By: Rock Nephew CMA (March 14, 2010 3:53 PM)  Nutrition Counseling: Patient's BMI is greater than 25 and therefore counseled on weight management options.  O2 Flow:  Room air  Primary Care Provider:  Etta Grandchild MD   History of Present Illness: He returns for a wound check on his left thumb. He has a small streak of numbness over the ulnar side of the distal phalanx. He has no pain, redness, or swelling and his strength is good.  Preventive Screening-Counseling & Management  Alcohol-Tobacco     Alcohol drinks/day: 0     Alcohol Counseling: not indicated; patient does not drink     Smoking Status: quit     Tobacco Counseling: not to resume use of tobacco products  Clinical Review Panels:  Prevention   Last Colonoscopy:  1) 4 mm diminutive polyp in the ascending colon, removed ADENOMA 2) Otherwise normal examination, good prep (04/24/2008)   Last PSA:  0.23 (04/25/2009)  Immunizations   Last Tetanus Booster:  Tdap (04/25/2009)   Last Flu Vaccine:  Fluvax 3+ (01/13/2010)  Lipid Management   Cholesterol:  189 (05/22/2009)   LDL (bad choesterol):  124 (05/22/2009)   HDL (good cholesterol):  46.30 (05/22/2009)  Diabetes Management   HgBA1C:  5.6 (05/27/2009)   Creatinine:  0.9 (05/27/2009)   Last Flu Vaccine:  Fluvax 3+ (01/13/2010)  CBC   WBC:  7.0 (04/25/2009)   RBC:  4.64 (04/25/2009)   Hgb:  14.2 (04/25/2009)   Hct:  40.0 (04/25/2009)   Platelets:  309.0 (04/25/2009)   MCV  86.2 (04/25/2009)   MCHC  35.6 (04/25/2009)   RDW  12.9 (04/25/2009)   PMN:  56.2 (04/25/2009)   Lymphs:  29.8 (04/25/2009)   Monos:  8.3  (04/25/2009)   Eosinophils:  5.0 (04/25/2009)   Basophil:  0.7 (04/25/2009)  Complete Metabolic Panel   Glucose:  108 (05/27/2009)   Sodium:  138 (05/27/2009)   Potassium:  4.0 (05/27/2009)   Chloride:  105 (05/27/2009)   CO2:  28 (05/27/2009)   BUN:  15 (05/27/2009)   Creatinine:  0.9 (05/27/2009)   Albumin:  4.1 (04/25/2009)   Total Protein:  6.2 (04/25/2009)   Calcium:  8.8 (05/27/2009)   Total Bili:  0.1 (04/25/2009)   Alk Phos:  41 (04/25/2009)   SGPT (ALT):  22 (04/25/2009)   SGOT (AST):  19 (04/25/2009)   Medications Prior to Update: 1)  Aspirin 81 Mg Tabs (Aspirin) .Marland Kitchen.. 1 By Mouth Once Daily 2)  Fish Oil 1200 Mg Caps (Omega-3 Fatty Acids) .Marland Kitchen.. 1 By Mouth Once Daily 3)  Multivitamins  Caps (Multiple Vitamin) .Marland Kitchen.. 1 By Mouth Once Daily 4)  Losartan Potassium-Hctz 50-12.5 Mg Tabs (Losartan Potassium-Hctz) .... One By Mouth Once Daily For High Blood Pressure 5)  Axiron 30 Mg .... Apply One Pump Under Each Arm Once Daily 6)  Trazodone Hcl 50 Mg Tabs (Trazodone Hcl) .Marland Kitchen.. 1-2 By Mouth  At Bedtime As Needed For Insomnia  Current Medications (verified): 1)  Aspirin 81 Mg Tabs (Aspirin) .Marland Kitchen.. 1 By Mouth Once Daily 2)  Fish Oil 1200 Mg Caps (Omega-3 Fatty Acids) .Marland Kitchen.. 1 By Mouth Once Daily 3)  Multivitamins  Caps (Multiple Vitamin) .Marland Kitchen.. 1 By Mouth Once Daily 4)  Losartan Potassium-Hctz 50-12.5 Mg Tabs (Losartan Potassium-Hctz) .... One By Mouth Once Daily For High Blood Pressure 5)  Axiron 30 Mg .... Apply One Pump Under Each Arm Once Daily 6)  Trazodone Hcl 50 Mg Tabs (Trazodone Hcl) .Marland Kitchen.. 1-2 By Mouth At Bedtime As Needed For Insomnia  Allergies (verified): 1)  ! 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 n/a  Social History: Reviewed history from 04/25/2009 and no changes required. 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  Physical Exam  Msk:  the left thumb looks good and there is good approximation but I don't think the sutures are ready to come out yet. There is no redness, pain, swelling, exudate, or ttp and he has very good flexion and extension at the IP joint and there is good capillary refill. I do not detect any sensory deficit.   Impression & Recommendations:  Problem # 1:  LACERATION, LEFT THUMB (ICD-883.0) Assessment Unchanged  I offered him a referral to a hand surgeon but he tells me that he will wait and see if the numbness improves over the next few weeks. the sutures did not come out today.  Orders: No Charge Patient Arrived (NCPA0) (NCPA0)  Complete Medication List: 1)  Aspirin 81 Mg Tabs (Aspirin) .Marland Kitchen.. 1 by mouth once daily 2)  Fish Oil 1200 Mg Caps (Omega-3 fatty acids) .Marland Kitchen.. 1 by mouth once daily 3)  Multivitamins Caps (Multiple vitamin) .Marland Kitchen.. 1 by mouth once daily 4)  Losartan Potassium-hctz 50-12.5 Mg Tabs (Losartan potassium-hctz) .... One by mouth once daily for high blood pressure 5)  Axiron 30 Mg  .... Apply one pump under each arm once daily 6)  Trazodone Hcl 50 Mg Tabs (Trazodone hcl) .Marland Kitchen.. 1-2 by mouth at bedtime as needed for insomnia  Patient Instructions: 1)   Please schedule a follow-up appointment in 5-7 days for suture removal. 2)  Take your antibiotic as prescribed until ALL of it is gone, but stop if you develop a rash or swelling and contact our office as soon as possible. Prescriptions: AXIRON 30 MG Apply one pump under each arm once daily  #48mili x 5   Entered and Authorized by:   Etta Grandchild MD   Signed by:   Etta Grandchild MD on 03/14/2010   Method used:   Print then Give to Patient   RxID:   [id number]    Orders Added: 1)  No Charge  Patient Arrived (NCPA0) [NCPA0]

## 2010-04-09 LAB — BASIC METABOLIC PANEL
CO2: 29 mEq/L (ref 19–32)
Chloride: 101 mEq/L (ref 96–112)
Chloride: 102 mEq/L (ref 96–112)
Creatinine, Ser: 0.77 mg/dL (ref 0.4–1.5)
Creatinine, Ser: 0.84 mg/dL (ref 0.4–1.5)
GFR calc Af Amer: >60 mL/min (ref >60)
GFR calc Af Amer: >60 mL/min (ref >60)
GFR calc non Af Amer: >60 mL/min (ref >60)
Potassium: 3.7 mEq/L (ref 3.5–5.1)
Potassium: 4.2 mEq/L (ref 3.5–5.1)

## 2010-04-09 LAB — CBC
MCHC: 34.8 g/dL (ref 30.0–36.0)
MCV: 86.6 fL (ref 78.0–100.0)
MCV: 86.7 fL (ref 78.0–100.0)
Platelets: 280 10*3/uL (ref 150–400)
RBC: 4.1 MIL/uL — ABNORMAL LOW (ref 4.22–5.81)
RDW: 12.7 % (ref 11.5–15.5)
WBC: 12.9 10*3/uL — ABNORMAL HIGH (ref 4.0–10.5)
WBC: 9.7 10*3/uL (ref 4.0–10.5)

## 2010-04-09 LAB — COMPREHENSIVE METABOLIC PANEL
AST: 22 U/L (ref 0–37)
Albumin: 4.2 g/dL (ref 3.5–5.2)
Calcium: 9.2 mg/dL (ref 8.4–10.5)
Chloride: 106 mEq/L (ref 96–112)
Creatinine, Ser: 0.84 mg/dL (ref 0.4–1.5)
GFR calc Af Amer: >60 mL/min (ref >60)
Total Protein: 7.1 g/dL (ref 6.0–8.3)

## 2010-04-09 LAB — DIFFERENTIAL
Eosinophils Relative: 1 % (ref 0–5)
Lymphocytes Relative: 6 % — ABNORMAL LOW (ref 12–46)
Lymphs Abs: 0.8 10*3/uL (ref 0.7–4.0)
Monocytes Absolute: 0.8 10*3/uL (ref 0.1–1.0)
Monocytes Relative: 6 % (ref 3–12)

## 2010-06-25 ENCOUNTER — Other Ambulatory Visit: Payer: Self-pay | Admitting: Internal Medicine

## 2010-06-25 ENCOUNTER — Other Ambulatory Visit (INDEPENDENT_AMBULATORY_CARE_PROVIDER_SITE_OTHER): Payer: BC Managed Care – PPO

## 2010-06-25 ENCOUNTER — Ambulatory Visit (INDEPENDENT_AMBULATORY_CARE_PROVIDER_SITE_OTHER): Payer: BC Managed Care – PPO | Admitting: Internal Medicine

## 2010-06-25 ENCOUNTER — Encounter: Payer: Self-pay | Admitting: Internal Medicine

## 2010-06-25 VITALS — BP 112/78 | HR 56 | Temp 98.5°F | Resp 16 | Wt 173.0 lb

## 2010-06-25 DIAGNOSIS — Z0000 Encounter for general adult medical examination without abnormal findings: Secondary | ICD-10-CM | POA: Insufficient documentation

## 2010-06-25 DIAGNOSIS — E781 Pure hyperglyceridemia: Secondary | ICD-10-CM

## 2010-06-25 DIAGNOSIS — R6882 Decreased libido: Secondary | ICD-10-CM

## 2010-06-25 DIAGNOSIS — I10 Essential (primary) hypertension: Secondary | ICD-10-CM

## 2010-06-25 DIAGNOSIS — E291 Testicular hypofunction: Secondary | ICD-10-CM

## 2010-06-25 DIAGNOSIS — R7309 Other abnormal glucose: Secondary | ICD-10-CM

## 2010-06-25 LAB — CBC WITH DIFFERENTIAL/PLATELET
Basophils Absolute: 0 10*3/uL (ref 0.0–0.1)
Eosinophils Absolute: 0.2 10*3/uL (ref 0.0–0.7)
Hemoglobin: 13.5 g/dL (ref 13.0–17.0)
Lymphocytes Relative: 36.7 % (ref 12.0–46.0)
MCHC: 35.2 g/dL (ref 30.0–36.0)
Monocytes Relative: 7.9 % (ref 3.0–12.0)
Neutrophils Relative %: 50.6 % (ref 43.0–77.0)
Platelets: 273 10*3/uL (ref 150.0–400.0)
RDW: 13.1 % (ref 11.5–14.6)

## 2010-06-25 LAB — LDL CHOLESTEROL, DIRECT: Direct LDL: 147.7 mg/dL

## 2010-06-25 LAB — COMPREHENSIVE METABOLIC PANEL
ALT: 22 U/L (ref 0–53)
AST: 21 U/L (ref 0–37)
Albumin: 4.5 g/dL (ref 3.5–5.2)
Alkaline Phosphatase: 29 U/L — ABNORMAL LOW (ref 39–117)
Calcium: 9.1 mg/dL (ref 8.4–10.5)
Chloride: 105 mEq/L (ref 96–112)
Potassium: 3.9 mEq/L (ref 3.5–5.1)

## 2010-06-25 LAB — HEMOGLOBIN A1C: Hgb A1c MFr Bld: 5.8 % (ref 4.6–6.5)

## 2010-06-25 LAB — LIPID PANEL
Cholesterol: 220 mg/dL — ABNORMAL HIGH (ref 0–200)
HDL: 57.1 mg/dL (ref >39.00)
Total CHOL/HDL Ratio: 4
Triglycerides: 76 mg/dL (ref 0.0–149.0)
VLDL: 15.2 mg/dL (ref 0.0–40.0)

## 2010-06-25 LAB — PSA: PSA: 0.14 ng/mL (ref 0.10–4.00)

## 2010-06-25 NOTE — Patient Instructions (Signed)
Health Maintenance in Males MAINTAIN REGULAR HEALTH EXAMS  Maintain a healthy diet and normal weight. Increased weight leads to problems with blood pressure and diabetes. Decrease fat in the diet and increase exercise. Obtain a proper diet from your caregiver if necessary.   High blood pressure causes heart and blood vessel problems. Check blood pressures regularly and keep your blood pressure at normal limits. Aerobic exercise helps this. Persistent elevations of blood pressure should be treated with medications if weight loss and exercise are ineffective.   Avoid smoking, drinking in excess (more than 2 drinks per day), or use of street drugs. Do not share needles with anyone. Ask for help if you need assistance or instructions on stopping the use of alcohol, cigarettes, or drugs.   Maintain normal blood lipids and cholesterol. Your caregiver can give you information to lower your risk of heart disease or stroke.   Ask your caregiver if you are in need of early heart disease screening because of a strong family history of heart disease or signs of elevated testosterone (male sex hormone) levels. These can predispose you to early heart disease.   Practice safe sex. Practicing safe sex decreases your risk for a sexually transmitted infection (STI). Some of the STIs are gonorrhea, chlamydia, syphilis, trichimonas, herpes, human papillomavirus (HPV), and human immunodeficiency virus (HIV). Herpes, HIV, and HPV are viral illnesses that have no cure. These can result in disability, cancer, and death.   It is not safe for someone who has AIDS or is HIV positive to have unprotected sex with a partner who is HIV positive. The reason for this is the fact that there are many different strains of HIV. If you have a strain that is readily treated with medications and then suddenly introduce a strain from a partner that has no further treatment options, you may suddenly have a strain of HIV that is untreatable.  Even if you are both positive for HIV, it is still necessary to practice safe sex.   Use sunscreen with a SPF of 15 or greater. Being outside in the sun when your shadow caused by the sun is shorter than you are, means you are being exposed to sun at greater intensity. Lighter skinned people are at a greater risk of skin cancer.   Keep carbon monoxide and smoke detectors in your home and functioning at all times. Change the batteries every 6 months.   Do monthly examinations of your testicles. The best time to do this is after a hot shower or bath when the tissues are loose. Notify your caregivers of any lumps, tenderness, or changes in size or shape.   Notify your caregiver of new moles or changes in moles, especially if there is a change in shape or color. Also notify your caregiver if a mole is larger than the size of a pencil eraser.   Stay current with your tetanus shots and other required immunizations.  The Body Mass Index (BMI) is a way of measuring how much of your body is fat. Having a BMI above 27 increases the risk of heart disease, diabetes, hypertension, stroke, and other problems related to obesity. Document Released: 06/20/2007 Document Re-Released: 06/11/2009 Exit[employer] Patient Information 2011 [employer]. 

## 2010-06-26 ENCOUNTER — Encounter: Payer: Self-pay | Admitting: Internal Medicine

## 2010-06-26 LAB — TESTOSTERONE, FREE, TOTAL, SHBG
Testosterone, Free: 71.4 pg/mL (ref 47.0–244.0)
Testosterone-% Free: 1.4 % — ABNORMAL LOW (ref 1.6–2.9)
Testosterone: 528.26 ng/dL (ref 250–890)

## 2010-06-26 NOTE — Assessment & Plan Note (Signed)
Labs ordered, pt ed material was given 

## 2010-06-26 NOTE — Progress Notes (Signed)
  Subjective:    Patient ID: Juan [name], male    DOB: 1955-11-19, 55 y.o.   MRN: [id number]  HPI He returns for a complete physical and he offers no complaints.    Review of Systems  Constitutional: Negative.   HENT: Negative.   Eyes: Negative.   Respiratory: Negative.   Cardiovascular: Negative.   Gastrointestinal: Negative.   Genitourinary: Negative.   Musculoskeletal: Negative.   Skin: Negative.   Neurological: Negative.   Hematological: Negative.   Psychiatric/Behavioral: Negative.        Objective:   Physical Exam  Vitals reviewed. Constitutional: He is oriented to person, place, and time. He appears well-developed and well-nourished. No distress.  HENT:  Head: Normocephalic and atraumatic.  Right Ear: External ear normal.  Left Ear: External ear normal.  Nose: Nose normal.  Mouth/Throat: Oropharynx is clear and moist. No oropharyngeal exudate.  Eyes: Conjunctivae and EOM are normal. Pupils are equal, round, and reactive to light. Right eye exhibits no discharge. Left eye exhibits no discharge. No scleral icterus.  Neck: Normal range of motion. Neck supple. No JVD present. No tracheal deviation present. No thyromegaly present.  Cardiovascular: Normal rate, regular rhythm, normal heart sounds and intact distal pulses.  Exam reveals no gallop and no friction rub.   No murmur heard. Pulmonary/Chest: Effort normal and breath sounds normal. No stridor. No respiratory distress. He has no wheezes. He has no rales. He exhibits no tenderness.  Abdominal: Soft. Bowel sounds are normal. He exhibits no distension and no mass. There is no tenderness. There is no rebound and no guarding. Hernia confirmed negative in the right inguinal area and confirmed negative in the left inguinal area.  Genitourinary: Rectum normal, testes normal and penis normal. Rectal exam shows no external hemorrhoid, no internal hemorrhoid, no fissure, no mass, no tenderness and anal tone normal. Guaiac  negative stool. Prostate is not enlarged and not tender. Right testis shows no mass, no swelling and no tenderness. Left testis shows no mass, no swelling and no tenderness. Circumcised. No penile erythema or penile tenderness. No discharge found.  Musculoskeletal: Normal range of motion. He exhibits no edema and no tenderness.  Lymphadenopathy:    He has no cervical adenopathy.       Right: No inguinal adenopathy present.       Left: No inguinal adenopathy present.  Neurological: He is alert and oriented to person, place, and time. He has normal reflexes. He displays normal reflexes. No cranial nerve deficit. He exhibits normal muscle tone. Coordination normal.  Skin: Skin is warm, dry and intact. No abrasion, no bruising, no burn, no ecchymosis, no laceration, no lesion, no petechiae, no purpura and no rash noted. Rash is not macular, not papular, not maculopapular, not nodular, not pustular, not vesicular and not urticarial. He is not diaphoretic. No erythema. No pallor.       He has excessive tanning and areas of sun damaged skin with multiple, very small AKs, there is a pedunculated tan mole on the back of his neck that has no features of atypia.  Psychiatric: He has a normal mood and affect. His behavior is normal. Judgment and thought content normal.          Assessment & Plan:

## 2010-08-18 ENCOUNTER — Telehealth: Payer: Self-pay | Admitting: *Deleted

## 2010-08-18 MED ORDER — TRAMADOL HCL 50 MG PO TABS: 50.0000 mg | ORAL_TABLET | Freq: Four times a day (QID) | ORAL | Status: DC | PRN

## 2010-08-18 MED ORDER — NAPROXEN 500 MG PO TABS: 500.0000 mg | ORAL_TABLET | Freq: Two times a day (BID) | ORAL | Status: DC

## 2010-08-18 NOTE — Telephone Encounter (Signed)
Pharmacy needed for tramadol Rx

## 2010-08-18 NOTE — Telephone Encounter (Signed)
Spoke with patient and advised per MD. Per pt he is uncomfortable taking pain medication and would like MD to suggest something more like a NSAID. Pharmacy has now been entered in [employer]

## 2010-08-18 NOTE — Telephone Encounter (Signed)
Pt was seen by ortho MD - he was given meloxicam and exercises for his shoulder. This has not helped. He has also tried OTC aleve. Patient requesting RX from MD for alt RX to help w/dx of arthritis. He is "not looking for anything mind or mood altering" Please advise.

## 2010-09-08 ENCOUNTER — Other Ambulatory Visit: Payer: Self-pay | Admitting: Internal Medicine

## 2010-11-21 ENCOUNTER — Telehealth: Payer: Self-pay

## 2010-11-21 NOTE — Telephone Encounter (Signed)
celebrex is less effective so I do not think it will help, he should see an ortho doctor

## 2010-11-21 NOTE — Telephone Encounter (Signed)
Patient called [facility] c/o continued shoulder pain. Per pt naproxen does not really help, he states that pharmacy recommended celebrex. Patient would like to know MD thinks this would help for him, if so pt request rx. Thanks

## 2010-11-24 MED ORDER — CELECOXIB 200 MG PO CAPS: 200.0000 mg | ORAL_CAPSULE | Freq: Every day | ORAL | Status: AC

## 2010-11-24 NOTE — Telephone Encounter (Signed)
Rx for celebrex was sent to his pharmacy

## 2010-11-24 NOTE — Telephone Encounter (Signed)
Returned call and advised per MD. Patient states that he was seen by ortho mo ago,diagnosed with arthritis and started on a low dose of meloxicam (low dose didn't help). Please advise thanks

## 2010-11-25 NOTE — Telephone Encounter (Signed)
Informed patient that his Rx was sent to his pharmacy.

## 2011-03-13 ENCOUNTER — Other Ambulatory Visit: Payer: Self-pay | Admitting: Internal Medicine

## 2011-03-16 ENCOUNTER — Other Ambulatory Visit: Payer: Self-pay

## 2011-03-16 MED ORDER — LOSARTAN POTASSIUM-HCTZ 50-12.5 MG PO TABS: 1.0000 | ORAL_TABLET | Freq: Every day | ORAL | Status: DC

## 2011-07-01 ENCOUNTER — Ambulatory Visit (INDEPENDENT_AMBULATORY_CARE_PROVIDER_SITE_OTHER): Payer: BC Managed Care – PPO | Admitting: Internal Medicine

## 2011-07-01 ENCOUNTER — Encounter: Payer: Self-pay | Admitting: Internal Medicine

## 2011-07-01 ENCOUNTER — Telehealth: Payer: Self-pay

## 2011-07-01 ENCOUNTER — Other Ambulatory Visit (INDEPENDENT_AMBULATORY_CARE_PROVIDER_SITE_OTHER): Payer: BC Managed Care – PPO

## 2011-07-01 VITALS — BP 130/82 | HR 60 | Temp 97.7°F | Resp 16 | Wt 178.0 lb

## 2011-07-01 DIAGNOSIS — Z0000 Encounter for general adult medical examination without abnormal findings: Secondary | ICD-10-CM

## 2011-07-01 DIAGNOSIS — I10 Essential (primary) hypertension: Secondary | ICD-10-CM

## 2011-07-01 DIAGNOSIS — Z136 Encounter for screening for cardiovascular disorders: Secondary | ICD-10-CM

## 2011-07-01 DIAGNOSIS — E781 Pure hyperglyceridemia: Secondary | ICD-10-CM

## 2011-07-01 DIAGNOSIS — E291 Testicular hypofunction: Secondary | ICD-10-CM

## 2011-07-01 LAB — CBC WITH DIFFERENTIAL/PLATELET
Basophils Absolute: 0 10*3/uL (ref 0.0–0.1)
Basophils Relative: 0.9 % (ref 0.0–3.0)
Eosinophils Absolute: 0.2 10*3/uL (ref 0.0–0.7)
Hemoglobin: 14.1 g/dL (ref 13.0–17.0)
Lymphs Abs: 1.1 10*3/uL (ref 0.7–4.0)
MCHC: 34 g/dL (ref 30.0–36.0)
MCV: 88.5 fl (ref 78.0–100.0)
Monocytes Absolute: 0.5 10*3/uL (ref 0.1–1.0)
Neutro Abs: 2.6 10*3/uL (ref 1.4–7.7)
RBC: 4.67 Mil/uL (ref 4.22–5.81)
RDW: 13.3 % (ref 11.5–14.6)

## 2011-07-01 LAB — URINALYSIS, ROUTINE W REFLEX MICROSCOPIC
Bilirubin Urine: NEGATIVE
Hgb urine dipstick: NEGATIVE
Ketones, ur: NEGATIVE
Total Protein, Urine: NEGATIVE
Urine Glucose: NEGATIVE
Urobilinogen, UA: 0.2 (ref 0.0–1.0)

## 2011-07-01 LAB — COMPREHENSIVE METABOLIC PANEL
ALT: 25 U/L (ref 0–53)
AST: 19 U/L (ref 0–37)
Alkaline Phosphatase: 27 U/L — ABNORMAL LOW (ref 39–117)
BUN: 24 mg/dL — ABNORMAL HIGH (ref 6–23)
Calcium: 9.5 mg/dL (ref 8.4–10.5)
Chloride: 106 mEq/L (ref 96–112)
Creatinine, Ser: 0.8 mg/dL (ref 0.4–1.5)
Potassium: 4.4 mEq/L (ref 3.5–5.1)

## 2011-07-01 LAB — LIPID PANEL
HDL: 58.2 mg/dL (ref >39.00)
Total CHOL/HDL Ratio: 4
Triglycerides: 166 mg/dL — ABNORMAL HIGH (ref 0.0–149.0)
VLDL: 33.2 mg/dL (ref 0.0–40.0)

## 2011-07-01 MED ORDER — TRAZODONE HCL 50 MG PO TABS: ORAL_TABLET | ORAL | Status: DC

## 2011-07-01 NOTE — Assessment & Plan Note (Signed)
Exam done, labs ordered, pt ed material was given 

## 2011-07-01 NOTE — Assessment & Plan Note (Signed)
I will check his testosterone level to see if a low T level is causing his fatigue

## 2011-07-01 NOTE — Progress Notes (Signed)
Subjective:    Patient ID: Juan [name], male    DOB: June 15, 1955, 56 y.o.   MRN: [id number]  Hypertension This is a chronic problem. The current episode started more than 1 year ago. The problem has been gradually improving since onset. The problem is controlled. Associated symptoms include malaise/fatigue. Pertinent negatives include no anxiety, blurred vision, chest pain, headaches, neck pain, orthopnea, palpitations, peripheral edema, PND, shortness of breath or sweats. Agents associated with hypertension include NSAIDs. Past treatments include angiotensin blockers and diuretics. The current treatment provides significant improvement. There are no compliance problems.       Review of Systems  Constitutional: Positive for malaise/fatigue and fatigue. Negative for fever, chills, diaphoresis, activity change, appetite change and unexpected weight change.  HENT: Negative.  Negative for neck pain.   Eyes: Negative.  Negative for blurred vision.  Respiratory: Negative for apnea, cough, choking, chest tightness, shortness of breath, wheezing and stridor.   Cardiovascular: Negative for chest pain, palpitations, orthopnea, leg swelling and PND.  Gastrointestinal: Negative for nausea, vomiting, abdominal pain, diarrhea, constipation, blood in stool and abdominal distention.  Genitourinary: Negative for dysuria, urgency, frequency, hematuria, flank pain, decreased urine volume, discharge, penile swelling, scrotal swelling, enuresis, difficulty urinating, genital sores, penile pain and testicular pain.  Musculoskeletal: Negative for myalgias, back pain, joint swelling, arthralgias and gait problem.  Skin: Negative for color change, pallor, rash and wound.  Neurological: Negative.  Negative for headaches.  Hematological: Negative for adenopathy. Does not bruise/bleed easily.  Psychiatric/Behavioral: Negative.        Objective:   Physical Exam  Vitals reviewed. Constitutional: He is oriented to  person, place, and time. He appears well-developed and well-nourished. No distress.  HENT:  Head: Normocephalic and atraumatic.  Nose: Nose normal.  Mouth/Throat: Oropharynx is clear and moist.  Eyes: Conjunctivae are normal. Right eye exhibits no discharge. Left eye exhibits no discharge. No scleral icterus.  Neck: Normal range of motion. Neck supple. No JVD present. No tracheal deviation present. No thyromegaly present.  Cardiovascular: Normal rate, regular rhythm, normal heart sounds and intact distal pulses.  Exam reveals no gallop and no friction rub.   No murmur heard. Pulmonary/Chest: Effort normal and breath sounds normal. No stridor. No respiratory distress. He has no wheezes. He has no rales. He exhibits no tenderness.  Abdominal: Soft. Bowel sounds are normal. He exhibits no distension and no mass. There is no tenderness. There is no rebound and no guarding. Hernia confirmed negative in the right inguinal area and confirmed negative in the left inguinal area.  Genitourinary: Rectum normal, prostate normal, testes normal and penis normal. Rectal exam shows no external hemorrhoid, no internal hemorrhoid, no fissure, no mass, no tenderness and anal tone normal. Guaiac negative stool. Prostate is not enlarged and not tender. Right testis shows no mass, no swelling and no tenderness. Right testis is descended. Left testis shows no mass, no swelling and no tenderness. Left testis is descended. Circumcised. No penile tenderness. No discharge found.  Musculoskeletal: Normal range of motion. He exhibits no edema and no tenderness.  Lymphadenopathy:    He has no cervical adenopathy.       Right: No inguinal adenopathy present.       Left: No inguinal adenopathy present.  Neurological: He is oriented to person, place, and time.  Skin: Skin is warm and dry. No rash noted. He is not diaphoretic. No erythema. No pallor.  Psychiatric: He has a normal mood and affect. His behavior is normal. Judgment  and  thought content normal.      Lab Results  Component Value Date   WBC 4.4* 06/25/2010   HGB 13.5 06/25/2010   HCT 38.4* 06/25/2010   PLT 273.0 06/25/2010   GLUCOSE 88 06/25/2010   CHOL 220* 06/25/2010   TRIG 76.0 06/25/2010   HDL 57.10 06/25/2010   LDLDIRECT 147.7 06/25/2010   LDLCALC 124* 05/22/2009   ALT 22 06/25/2010   AST 21 06/25/2010   NA 139 06/25/2010   K 3.9 06/25/2010   CL 105 06/25/2010   CREATININE 0.9 06/25/2010   BUN 19 06/25/2010   CO2 27 06/25/2010   TSH 0.57 06/25/2010   PSA 0.14 06/25/2010   HGBA1C 5.8 06/25/2010     Assessment & Plan:

## 2011-07-01 NOTE — Telephone Encounter (Signed)
Pt filled out walk in stating while at appt this am he failed to ask for refill on Trazadone. [facility] Please advise if ok, Thanks

## 2011-07-01 NOTE — Assessment & Plan Note (Signed)
His EKG is normal today

## 2011-07-01 NOTE — Telephone Encounter (Signed)
yes

## 2011-07-01 NOTE — Assessment & Plan Note (Signed)
His BP is well controlled, I will check his lytes and renal function 

## 2011-07-01 NOTE — Patient Instructions (Signed)
Health Maintenance, Males A healthy lifestyle and preventative care can promote health and wellness.  Maintain regular health, dental, and eye exams.   Eat a healthy diet. Foods like vegetables, fruits, whole grains, low-fat dairy products, and lean protein foods contain the nutrients you need without too many calories. Decrease your intake of foods high in solid fats, added sugars, and salt. Get information about a proper diet from your caregiver, if necessary.   Regular physical exercise is one of the most important things you can do for your health. Most adults should get at least 150 minutes of moderate-intensity exercise (any activity that increases your heart rate and causes you to sweat) each week. In addition, most adults need muscle-strengthening exercises on 2 or more days a week.    Maintain a healthy weight. The body mass index (BMI) is a screening tool to identify possible weight problems. It provides an estimate of body fat based on height and weight. Your caregiver can help determine your BMI, and can help you achieve or maintain a healthy weight. For adults 20 years and older:   A BMI below 18.5 is considered underweight.   A BMI of 18.5 to 24.9 is normal.   A BMI of 25 to 29.9 is considered overweight.   A BMI of 30 and above is considered obese.   Maintain normal blood lipids and cholesterol by exercising and minimizing your intake of saturated fat. Eat a balanced diet with plenty of fruits and vegetables. Blood tests for lipids and cholesterol should begin at age 20 and be repeated every 5 years. If your lipid or cholesterol levels are high, you are over 50, or you are a high risk for heart disease, you may need your cholesterol levels checked more frequently.Ongoing high lipid and cholesterol levels should be treated with medicines, if diet and exercise are not effective.   If you smoke, find out from your caregiver how to quit. If you do not use tobacco, do not start.    If you choose to drink alcohol, do not exceed 2 drinks per day. One drink is considered to be 12 ounces (355 mL) of beer, 5 ounces (148 mL) of wine, or 1.5 ounces (44 mL) of liquor.   Avoid use of street drugs. Do not share needles with anyone. Ask for help if you need support or instructions about stopping the use of drugs.   High blood pressure causes heart disease and increases the risk of stroke. Blood pressure should be checked at least every 1 to 2 years. Ongoing high blood pressure should be treated with medicines if weight loss and exercise are not effective.   If you are 45 to 56 years old, ask your caregiver if you should take aspirin to prevent heart disease.   Diabetes screening involves taking a blood sample to check your fasting blood sugar level. This should be done once every 3 years, after age 45, if you are within normal weight and without risk factors for diabetes. Testing should be considered at a younger age or be carried out more frequently if you are overweight and have at least 1 risk factor for diabetes.   Colorectal cancer can be detected and often prevented. Most routine colorectal cancer screening begins at the age of 50 and continues through age 75. However, your caregiver may recommend screening at an earlier age if you have risk factors for colon cancer. On a yearly basis, your caregiver may provide home test kits to check for hidden   blood in the stool. Use of a small camera at the end of a tube, to directly examine the colon (sigmoidoscopy or colonoscopy), can detect the earliest forms of colorectal cancer. Talk to your caregiver about this at age 50, when routine screening begins. Direct examination of the colon should be repeated every 5 to 10 years through age 75, unless early forms of pre-cancerous polyps or small growths are found.   Hepatitis C blood testing is recommended for all people born from 1945 through 1965 and any individual with known risks for  hepatitis C.   Healthy men should no longer receive prostate-specific antigen (PSA) blood tests as part of routine cancer screening. Consult with your caregiver about prostate cancer screening.   Testicular cancer screening is not recommended for adolescents or adult males who have no symptoms. Screening includes self-exam, caregiver exam, and other screening tests. Consult with your caregiver about any symptoms you have or any concerns you have about testicular cancer.   Practice safe sex. Use condoms and avoid high-risk sexual practices to reduce the spread of sexually transmitted infections (STIs).   Use sunscreen with a sun protection factor (SPF) of 30 or greater. Apply sunscreen liberally and repeatedly throughout the day. You should seek shade when your shadow is shorter than you. Protect yourself by wearing long sleeves, pants, a wide-brimmed hat, and sunglasses year round, whenever you are outdoors.   Notify your caregiver of new moles or changes in moles, especially if there is a change in shape or color. Also notify your caregiver if a mole is larger than the size of a pencil eraser.   A one-time screening for abdominal aortic aneurysm (AAA) and surgical repair of large AAAs by sound wave imaging (ultrasonography) is recommended for ages 65 to 75 years who are current or former smokers.   Stay current with your immunizations.  Document Released: 06/20/2007 Document Revised: 12/11/2010 Document Reviewed: 05/19/2010 [employer] Patient Information 2012 [employer]. 

## 2011-07-01 NOTE — Assessment & Plan Note (Signed)
FLP today 

## 2011-07-02 ENCOUNTER — Encounter: Payer: Self-pay | Admitting: Internal Medicine

## 2011-07-02 LAB — TESTOSTERONE, FREE, TOTAL, SHBG
Sex Hormone Binding: 43 nmol/L (ref 13–71)
Testosterone, Free: 29.7 pg/mL — ABNORMAL LOW (ref 47.0–244.0)
Testosterone-% Free: 1.6 % (ref 1.6–2.9)

## 2011-07-03 ENCOUNTER — Telehealth: Payer: Self-pay

## 2011-07-03 DIAGNOSIS — E291 Testicular hypofunction: Secondary | ICD-10-CM

## 2011-07-03 MED ORDER — TESTOSTERONE 30 MG/ACT TD SOLN: 2.0000 | Freq: Every day | TRANSDERMAL | Status: DC

## 2011-07-03 NOTE — Telephone Encounter (Signed)
Pt called stating he received his lab results and would like to know if MD will adjust his testosterone medication to treat his low levels, please advise.

## 2011-07-03 NOTE — Telephone Encounter (Signed)
Please call in the axiron at 2 pumps per day

## 2011-07-06 MED ORDER — TESTOSTERONE 30 MG/ACT TD SOLN: 2.0000 | Freq: Every day | TRANSDERMAL | Status: DC

## 2011-07-07 ENCOUNTER — Telehealth: Payer: Self-pay

## 2011-07-07 DIAGNOSIS — E291 Testicular hypofunction: Secondary | ICD-10-CM

## 2011-07-07 MED ORDER — TESTOSTERONE 30 MG/ACT TD SOLN: 2.0000 | Freq: Every day | TRANSDERMAL | Status: DC

## 2011-07-07 NOTE — Telephone Encounter (Signed)
Rx refilled printed and awaiting MD signature to be faxed to local [facility].

## 2011-07-28 ENCOUNTER — Telehealth: Payer: Self-pay | Admitting: Internal Medicine

## 2011-07-28 DIAGNOSIS — E291 Testicular hypofunction: Secondary | ICD-10-CM

## 2011-07-28 NOTE — Telephone Encounter (Signed)
Caller: Toa/Patient; PCP: Sanda Linger; CB#: [phone]; ; ; Call regarding Recently Adjusted Testerone Supplements But Still fatigue and tiredness; taking 2 pumps axiron daily but wonders if needs to recheck his T level to see if it's making any progress.  States no improvement in past 4 weeks of using the medication.  Declines new triage; states no imrovement on new medication.  Per Epic, no return appt seen or recommeneded in note; info to office for provider review/callback.   Prefers using [facility] [employer] instead of [employer].   MAY REACH PATIENT AT [phone].

## 2011-07-29 NOTE — Telephone Encounter (Signed)
pls review his last office note, it states that he is due for f/up in 2 months (08/31/11 as indicated in his AVS), if he wants to check his T level before that time ok, I have placed an order for that

## 2011-09-17 ENCOUNTER — Other Ambulatory Visit: Payer: Self-pay

## 2011-09-17 MED ORDER — LOSARTAN POTASSIUM-HCTZ 50-12.5 MG PO TABS: 1.0000 | ORAL_TABLET | Freq: Every day | ORAL | Status: DC

## 2011-11-30 ENCOUNTER — Telehealth: Payer: Self-pay | Admitting: Internal Medicine

## 2011-11-30 MED ORDER — TADALAFIL 20 MG PO TABS: 20.0000 mg | ORAL_TABLET | Freq: Every day | ORAL | Status: DC | PRN

## 2011-11-30 NOTE — Telephone Encounter (Signed)
Pt advised of Rx/pharmacy 

## 2011-11-30 NOTE — Telephone Encounter (Signed)
Caller: Rydell/Patient; Phone: [phone]; Reason for Call: Pt was in for a physical 5-6 months ago.  He recently entered into a relationship and is having trouble maintaining an erection.  No problems obtaining one.  He is asking if MD could prescribe Cialis as a trial.

## 2011-11-30 NOTE — Telephone Encounter (Signed)
done

## 2011-12-26 ENCOUNTER — Other Ambulatory Visit: Payer: Self-pay | Admitting: Internal Medicine

## 2011-12-28 ENCOUNTER — Other Ambulatory Visit: Payer: Self-pay

## 2011-12-28 NOTE — Telephone Encounter (Signed)
[facility] pharmacy called stating that they received rx for testosterone #90 ml (1bottle) but should be #180 ml (2bottles). Returned call to pharmacy and quantity updated to 

## 2012-01-18 ENCOUNTER — Telehealth: Payer: Self-pay

## 2012-01-18 MED ORDER — TRAZODONE HCL 50 MG PO TABS: ORAL_TABLET | ORAL | Status: DC

## 2012-01-18 NOTE — Telephone Encounter (Signed)
done

## 2012-01-18 NOTE — Telephone Encounter (Signed)
Please advise if ok to refill trazodone 50mg  for mail order.

## 2012-03-27 ENCOUNTER — Other Ambulatory Visit: Payer: Self-pay | Admitting: Internal Medicine

## 2012-06-20 ENCOUNTER — Other Ambulatory Visit: Payer: Self-pay | Admitting: Internal Medicine

## 2012-06-23 ENCOUNTER — Other Ambulatory Visit: Payer: Self-pay | Admitting: Internal Medicine

## 2012-07-11 ENCOUNTER — Ambulatory Visit (INDEPENDENT_AMBULATORY_CARE_PROVIDER_SITE_OTHER): Payer: BC Managed Care – PPO | Admitting: Internal Medicine

## 2012-07-11 ENCOUNTER — Encounter: Payer: Self-pay | Admitting: Internal Medicine

## 2012-07-11 ENCOUNTER — Other Ambulatory Visit (INDEPENDENT_AMBULATORY_CARE_PROVIDER_SITE_OTHER): Payer: BC Managed Care – PPO

## 2012-07-11 VITALS — BP 130/70 | HR 66 | Temp 98.0°F | Resp 16 | Wt 176.5 lb

## 2012-07-11 DIAGNOSIS — E781 Pure hyperglyceridemia: Secondary | ICD-10-CM

## 2012-07-11 DIAGNOSIS — I10 Essential (primary) hypertension: Secondary | ICD-10-CM

## 2012-07-11 DIAGNOSIS — Z0000 Encounter for general adult medical examination without abnormal findings: Secondary | ICD-10-CM

## 2012-07-11 DIAGNOSIS — E291 Testicular hypofunction: Secondary | ICD-10-CM

## 2012-07-11 LAB — CBC WITH DIFFERENTIAL/PLATELET
Eosinophils Relative: 4.3 % (ref 0.0–5.0)
HCT: 40.7 % (ref 39.0–52.0)
Lymphocytes Relative: 25.2 % (ref 12.0–46.0)
Lymphs Abs: 1.5 10*3/uL (ref 0.7–4.0)
Monocytes Relative: 7.4 % (ref 3.0–12.0)
Neutrophils Relative %: 62.4 % (ref 43.0–77.0)
Platelets: 327 10*3/uL (ref 150.0–400.0)
WBC: 6.1 10*3/uL (ref 4.5–10.5)

## 2012-07-11 LAB — COMPREHENSIVE METABOLIC PANEL
CO2: 26 mEq/L (ref 19–32)
Calcium: 9.7 mg/dL (ref 8.4–10.5)
Chloride: 103 mEq/L (ref 96–112)
GFR: 94.81 mL/min (ref >60.00)
Glucose, Bld: 151 mg/dL — ABNORMAL HIGH (ref 70–99)
Sodium: 137 mEq/L (ref 135–145)
Total Bilirubin: 0.3 mg/dL (ref 0.3–1.2)
Total Protein: 6.7 g/dL (ref 6.0–8.3)

## 2012-07-11 LAB — LIPID PANEL
Cholesterol: 196 mg/dL (ref 0–200)
Triglycerides: 253 mg/dL — ABNORMAL HIGH (ref 0.0–149.0)

## 2012-07-11 LAB — LDL CHOLESTEROL, DIRECT: Direct LDL: 119.3 mg/dL

## 2012-07-11 LAB — TSH: TSH: 0.57 u[IU]/mL (ref 0.35–5.50)

## 2012-07-11 MED ORDER — CELECOXIB 200 MG PO CAPS: 200.0000 mg | ORAL_CAPSULE | Freq: Every day | ORAL | Status: DC

## 2012-07-11 MED ORDER — TESTOSTERONE 30 MG/ACT TD SOLN: 2.0000 | Freq: Every day | TRANSDERMAL | Status: DC

## 2012-07-11 NOTE — Assessment & Plan Note (Signed)
His BP is well controlled Today I will check his lytes and renal function 

## 2012-07-11 NOTE — Patient Instructions (Signed)
Health Maintenance, Males A healthy lifestyle and preventative care can promote health and wellness.  Maintain regular health, dental, and eye exams.  Eat a healthy diet. Foods like vegetables, fruits, whole grains, low-fat dairy products, and lean protein foods contain the nutrients you need without too many calories. Decrease your intake of foods high in solid fats, added sugars, and salt. Get information about a proper diet from your caregiver, if necessary.  Regular physical exercise is one of the most important things you can do for your health. Most adults should get at least 150 minutes of moderate-intensity exercise (any activity that increases your heart rate and causes you to sweat) each week. In addition, most adults need muscle-strengthening exercises on 2 or more days a week.   Maintain a healthy weight. The body mass index (BMI) is a screening tool to identify possible weight problems. It provides an estimate of body fat based on height and weight. Your caregiver can help determine your BMI, and can help you achieve or maintain a healthy weight. For adults 20 years and older:  A BMI below 18.5 is considered underweight.  A BMI of 18.5 to 24.9 is normal.  A BMI of 25 to 29.9 is considered overweight.  A BMI of 30 and above is considered obese.  Maintain normal blood lipids and cholesterol by exercising and minimizing your intake of saturated fat. Eat a balanced diet with plenty of fruits and vegetables. Blood tests for lipids and cholesterol should begin at age 20 and be repeated every 5 years. If your lipid or cholesterol levels are high, you are over 50, or you are a high risk for heart disease, you may need your cholesterol levels checked more frequently.Ongoing high lipid and cholesterol levels should be treated with medicines, if diet and exercise are not effective.  If you smoke, find out from your caregiver how to quit. If you do not use tobacco, do not start.  If you  choose to drink alcohol, do not exceed 2 drinks per day. One drink is considered to be 12 ounces (355 mL) of beer, 5 ounces (148 mL) of wine, or 1.5 ounces (44 mL) of liquor.  Avoid use of street drugs. Do not share needles with anyone. Ask for help if you need support or instructions about stopping the use of drugs.  High blood pressure causes heart disease and increases the risk of stroke. Blood pressure should be checked at least every 1 to 2 years. Ongoing high blood pressure should be treated with medicines if weight loss and exercise are not effective.  If you are 45 to 57 years old, ask your caregiver if you should take aspirin to prevent heart disease.  Diabetes screening involves taking a blood sample to check your fasting blood sugar level. This should be done once every 3 years, after age 45, if you are within normal weight and without risk factors for diabetes. Testing should be considered at a younger age or be carried out more frequently if you are overweight and have at least 1 risk factor for diabetes.  Colorectal cancer can be detected and often prevented. Most routine colorectal cancer screening begins at the age of 50 and continues through age 75. However, your caregiver may recommend screening at an earlier age if you have risk factors for colon cancer. On a yearly basis, your caregiver may provide home test kits to check for hidden blood in the stool. Use of a small camera at the end of a tube,   to directly examine the colon (sigmoidoscopy or colonoscopy), can detect the earliest forms of colorectal cancer. Talk to your caregiver about this at age 50, when routine screening begins. Direct examination of the colon should be repeated every 5 to 10 years through age 75, unless early forms of pre-cancerous polyps or small growths are found.  Hepatitis C blood testing is recommended for all people born from 1945 through 1965 and any individual with known risks for hepatitis C.  Healthy  men should no longer receive prostate-specific antigen (PSA) blood tests as part of routine cancer screening. Consult with your caregiver about prostate cancer screening.  Testicular cancer screening is not recommended for adolescents or adult males who have no symptoms. Screening includes self-exam, caregiver exam, and other screening tests. Consult with your caregiver about any symptoms you have or any concerns you have about testicular cancer.  Practice safe sex. Use condoms and avoid high-risk sexual practices to reduce the spread of sexually transmitted infections (STIs).  Use sunscreen with a sun protection factor (SPF) of 30 or greater. Apply sunscreen liberally and repeatedly throughout the day. You should seek shade when your shadow is shorter than you. Protect yourself by wearing long sleeves, pants, a wide-brimmed hat, and sunglasses year round, whenever you are outdoors.  Notify your caregiver of new moles or changes in moles, especially if there is a change in shape or color. Also notify your caregiver if a mole is larger than the size of a pencil eraser.  A one-time screening for abdominal aortic aneurysm (AAA) and surgical repair of large AAAs by sound wave imaging (ultrasonography) is recommended for ages 65 to 75 years who are current or former smokers.  Stay current with your immunizations. Document Released: 06/20/2007 Document Revised: 03/16/2011 Document Reviewed: 05/19/2010 Exit[employer] Patient Information 2014 [employer].  

## 2012-07-11 NOTE — Progress Notes (Signed)
Subjective:    Patient ID: Juan [name], male    DOB: [birth year], 57 y.o.   MRN: [id number]  Hypertension This is a chronic problem. The current episode started more than 1 year ago. The problem is unchanged. The problem is controlled. Pertinent negatives include no anxiety, blurred vision, chest pain, headaches, malaise/fatigue, neck pain, orthopnea, palpitations, peripheral edema, PND, shortness of breath or sweats. Agents associated with hypertension include NSAIDs. Past treatments include angiotensin blockers and diuretics. The current treatment provides significant improvement. There are no compliance problems.       Review of Systems  Constitutional: Negative.  Negative for fever, chills, malaise/fatigue, diaphoresis and fatigue.  HENT: Negative.  Negative for neck pain.   Eyes: Negative.  Negative for blurred vision.  Respiratory: Negative.  Negative for chest tightness and shortness of breath.   Cardiovascular: Negative.  Negative for chest pain, palpitations, orthopnea and PND.  Gastrointestinal: Negative.   Endocrine: Negative.   Genitourinary: Negative.   Musculoskeletal: Negative.   Skin: Negative.   Allergic/Immunologic: Negative.   Neurological: Negative.  Negative for headaches.  Hematological: Negative.  Negative for adenopathy. Does not bruise/bleed easily.  Psychiatric/Behavioral: Negative.        Objective:   Physical Exam  Vitals reviewed. Constitutional: He is oriented to person, place, and time. He appears well-developed and well-nourished. No distress.  HENT:  Head: Normocephalic and atraumatic.  Mouth/Throat: Oropharynx is clear and moist. No oropharyngeal exudate.  Eyes: Conjunctivae are normal. Right eye exhibits no discharge. Left eye exhibits no discharge. No scleral icterus.  Neck: Normal range of motion. Neck supple. No JVD present. No tracheal deviation present. No thyromegaly present.  Cardiovascular: Normal rate, regular rhythm, normal heart  sounds and intact distal pulses.  Exam reveals no gallop and no friction rub.   No murmur heard. Pulmonary/Chest: Effort normal and breath sounds normal. No stridor. No respiratory distress. He has no wheezes. He has no rales. He exhibits no tenderness.  Abdominal: Soft. Bowel sounds are normal. He exhibits no distension and no mass. There is no tenderness. There is no rebound and no guarding. Hernia confirmed negative in the right inguinal area and confirmed negative in the left inguinal area.  Genitourinary: Rectum normal, prostate normal, testes normal and penis normal. Rectal exam shows no external hemorrhoid, no internal hemorrhoid, no fissure, no mass, no tenderness and anal tone normal. Guaiac negative stool. Prostate is not enlarged and not tender. Right testis shows no mass, no swelling and no tenderness. Right testis is descended. Left testis shows no mass, no swelling and no tenderness. Left testis is descended. Circumcised. No penile erythema or penile tenderness. No discharge found.  Musculoskeletal: Normal range of motion. He exhibits no edema and no tenderness.  Lymphadenopathy:    He has no cervical adenopathy.       Right: No inguinal adenopathy present.       Left: No inguinal adenopathy present.  Neurological: He is oriented to person, place, and time.  Skin: Skin is warm and dry. No rash noted. He is not diaphoretic. No erythema. No pallor.  Psychiatric: He has a normal mood and affect. His behavior is normal. Judgment and thought content normal.     Lab Results  Component Value Date   WBC 4.4* 07/01/2011   HGB 14.1 07/01/2011   HCT 41.4 07/01/2011   PLT 289.0 07/01/2011   GLUCOSE 98 07/01/2011   CHOL 206* 07/01/2011   TRIG 166.0* 07/01/2011   HDL 58.20 07/01/2011   LDLDIRECT 111.2 07/01/2011  LDLCALC 124* 05/22/2009   ALT 25 07/01/2011   AST 19 07/01/2011   NA 138 07/01/2011   K 4.4 07/01/2011   CL 106 07/01/2011   CREATININE 0.8 07/01/2011   BUN 24* 07/01/2011   CO2 26  07/01/2011   TSH 0.81 07/01/2011   PSA 0.16 07/01/2011   HGBA1C 5.8 06/25/2010       Assessment & Plan:

## 2012-07-11 NOTE — Assessment & Plan Note (Signed)
FLP today 

## 2012-07-11 NOTE — Assessment & Plan Note (Signed)
Exam done Labs ordered Vaccines were reviewed Pt ed material was given 

## 2012-07-12 ENCOUNTER — Encounter: Payer: Self-pay | Admitting: Internal Medicine

## 2012-07-12 ENCOUNTER — Other Ambulatory Visit: Payer: Self-pay | Admitting: Internal Medicine

## 2012-07-12 DIAGNOSIS — R7309 Other abnormal glucose: Secondary | ICD-10-CM

## 2012-07-12 LAB — TESTOSTERONE, FREE, TOTAL, SHBG: Testosterone-% Free: 1.7 % (ref 1.6–2.9)

## 2012-07-13 ENCOUNTER — Other Ambulatory Visit (INDEPENDENT_AMBULATORY_CARE_PROVIDER_SITE_OTHER): Payer: BC Managed Care – PPO

## 2012-07-13 ENCOUNTER — Encounter: Payer: Self-pay | Admitting: Internal Medicine

## 2012-07-13 DIAGNOSIS — R7309 Other abnormal glucose: Secondary | ICD-10-CM

## 2012-07-13 LAB — HEMOGLOBIN A1C: Hgb A1c MFr Bld: 5.3 % (ref 4.6–6.5)

## 2012-07-13 LAB — BASIC METABOLIC PANEL
CO2: 26 mEq/L (ref 19–32)
Chloride: 103 mEq/L (ref 96–112)
Potassium: 3.6 mEq/L (ref 3.5–5.1)
Sodium: 139 mEq/L (ref 135–145)

## 2012-09-11 ENCOUNTER — Other Ambulatory Visit: Payer: Self-pay | Admitting: Internal Medicine

## 2012-09-20 ENCOUNTER — Telehealth: Payer: Self-pay | Admitting: *Deleted

## 2012-09-20 DIAGNOSIS — G4700 Insomnia, unspecified: Secondary | ICD-10-CM

## 2012-09-20 MED ORDER — DOXEPIN HCL 6 MG PO TABS: 1.0000 | ORAL_TABLET | Freq: Every evening | ORAL | Status: DC | PRN

## 2012-09-20 NOTE — Telephone Encounter (Signed)
Spoke with pt, advised of MDs order

## 2012-09-20 NOTE — Telephone Encounter (Signed)
Try doxepin 

## 2012-09-20 NOTE — Telephone Encounter (Signed)
Pt called states the Trazodone, his current sleep aide, is no longer effective.  Please advise

## 2012-09-21 ENCOUNTER — Telehealth: Payer: Self-pay | Admitting: *Deleted

## 2012-09-21 DIAGNOSIS — G4700 Insomnia, unspecified: Secondary | ICD-10-CM

## 2012-09-21 MED ORDER — ZOLPIDEM TARTRATE 10 MG PO TABS: 10.0000 mg | ORAL_TABLET | Freq: Every evening | ORAL | Status: DC | PRN

## 2012-09-21 NOTE — Telephone Encounter (Signed)
Spoke with pt, he says he will try anything at this point.

## 2012-09-21 NOTE — Telephone Encounter (Signed)
rx faxed, pt notified 

## 2012-09-21 NOTE — Telephone Encounter (Signed)
Pt called states Doxepin not covered by insurance.  States he purchased two tablets yesterday to try.  Further states medication not effective.  Please advise

## 2012-09-21 NOTE — Telephone Encounter (Signed)
His he willing to take a narcotic?

## 2012-09-28 ENCOUNTER — Other Ambulatory Visit: Payer: Self-pay | Admitting: Internal Medicine

## 2012-11-23 ENCOUNTER — Telehealth: Payer: Self-pay

## 2012-11-23 DIAGNOSIS — G4700 Insomnia, unspecified: Secondary | ICD-10-CM

## 2012-11-23 MED ORDER — ZOLPIDEM TARTRATE 10 MG PO TABS: 10.0000 mg | ORAL_TABLET | Freq: Every evening | ORAL | Status: DC | PRN

## 2012-11-23 NOTE — Telephone Encounter (Signed)
done

## 2012-11-23 NOTE — Telephone Encounter (Signed)
Received rx request for zolpidem 10 mg, last filled 09/21/12 #30/1rf and pt last seen 07/11/12. Please advise Thanks

## 2012-11-23 NOTE — Telephone Encounter (Signed)
RX faxed to pharmacy.

## 2012-12-13 ENCOUNTER — Other Ambulatory Visit: Payer: Self-pay | Admitting: Internal Medicine

## 2012-12-19 ENCOUNTER — Other Ambulatory Visit: Payer: Self-pay | Admitting: Internal Medicine

## 2013-01-14 ENCOUNTER — Other Ambulatory Visit: Payer: Self-pay | Admitting: Internal Medicine

## 2013-03-13 ENCOUNTER — Telehealth: Payer: Self-pay | Admitting: *Deleted

## 2013-03-13 MED ORDER — TRAZODONE HCL 50 MG PO TABS: ORAL_TABLET | ORAL | Status: DC

## 2013-03-13 NOTE — Telephone Encounter (Signed)
Patient phoned requesting refill for trazodone (last ordered 01/14/13) and last OV with PCP 07/11/12.  States the trazodone is more effective than ambien.  Please advise.  CB# [phone]

## 2013-03-13 NOTE — Telephone Encounter (Signed)
done

## 2013-03-13 NOTE — Telephone Encounter (Signed)
Phoned patient and notified requested refill for trazodone already sent to pharmacy.

## 2013-03-14 ENCOUNTER — Telehealth: Payer: Self-pay

## 2013-03-14 NOTE — Telephone Encounter (Signed)
He is due for an OV 

## 2013-03-14 NOTE — Telephone Encounter (Signed)
Received refill request from [facility] request refills for axiron 30 mg/ actuation sln . Rx last written 12/19/2012 180 ml 2 rf and pt last seen on 07/11/2012   . Please advise Thanks

## 2013-03-15 NOTE — Telephone Encounter (Signed)
Called pt and left a voice message, notifying him that in order to receive a refill on his med, he would need to schedule an appointment w/ dr. Ronnald Ramp, jk/cma

## 2013-03-17 ENCOUNTER — Ambulatory Visit (INDEPENDENT_AMBULATORY_CARE_PROVIDER_SITE_OTHER): Payer: BC Managed Care – PPO | Admitting: Internal Medicine

## 2013-03-17 ENCOUNTER — Encounter: Payer: Self-pay | Admitting: Internal Medicine

## 2013-03-17 ENCOUNTER — Other Ambulatory Visit (INDEPENDENT_AMBULATORY_CARE_PROVIDER_SITE_OTHER): Payer: BC Managed Care – PPO

## 2013-03-17 VITALS — BP 118/80 | HR 61 | Temp 98.3°F | Resp 16 | Ht 72.0 in | Wt 177.0 lb

## 2013-03-17 DIAGNOSIS — Z86010 Personal history of colonic polyps: Secondary | ICD-10-CM

## 2013-03-17 DIAGNOSIS — E291 Testicular hypofunction: Secondary | ICD-10-CM

## 2013-03-17 DIAGNOSIS — I10 Essential (primary) hypertension: Secondary | ICD-10-CM

## 2013-03-17 LAB — CBC WITH DIFFERENTIAL/PLATELET
BASOS PCT: 0.6 % (ref 0.0–3.0)
Basophils Absolute: 0 10*3/uL (ref 0.0–0.1)
Eosinophils Absolute: 0.2 10*3/uL (ref 0.0–0.7)
Eosinophils Relative: 3.4 % (ref 0.0–5.0)
HEMATOCRIT: 42 % (ref 39.0–52.0)
HEMOGLOBIN: 14.4 g/dL (ref 13.0–17.0)
Lymphocytes Relative: 30.6 % (ref 12.0–46.0)
Lymphs Abs: 1.7 10*3/uL (ref 0.7–4.0)
MCHC: 34.3 g/dL (ref 30.0–36.0)
MCV: 87.1 fl (ref 78.0–100.0)
MONOS PCT: 8.5 % (ref 3.0–12.0)
Monocytes Absolute: 0.5 10*3/uL (ref 0.1–1.0)
NEUTROS ABS: 3.2 10*3/uL (ref 1.4–7.7)
Neutrophils Relative %: 56.9 % (ref 43.0–77.0)
Platelets: 339 10*3/uL (ref 150.0–400.0)
RBC: 4.82 Mil/uL (ref 4.22–5.81)
RDW: 13.3 % (ref 11.5–14.6)
WBC: 5.6 10*3/uL (ref 4.5–10.5)

## 2013-03-17 LAB — COMPREHENSIVE METABOLIC PANEL
ALT: 26 U/L (ref 0–53)
AST: 23 U/L (ref 0–37)
Albumin: 4.5 g/dL (ref 3.5–5.2)
Alkaline Phosphatase: 28 U/L — ABNORMAL LOW (ref 39–117)
BILIRUBIN TOTAL: 0.6 mg/dL (ref 0.3–1.2)
BUN: 24 mg/dL — AB (ref 6–23)
CHLORIDE: 103 meq/L (ref 96–112)
CO2: 25 meq/L (ref 19–32)
CREATININE: 1 mg/dL (ref 0.4–1.5)
Calcium: 9.3 mg/dL (ref 8.4–10.5)
GFR: 77.99 mL/min (ref >60.00)
Glucose, Bld: 102 mg/dL — ABNORMAL HIGH (ref 70–99)
Potassium: 3.6 mEq/L (ref 3.5–5.1)
Sodium: 136 mEq/L (ref 135–145)
Total Protein: 7.3 g/dL (ref 6.0–8.3)

## 2013-03-17 MED ORDER — TESTOSTERONE 30 MG/ACT TD SOLN: TRANSDERMAL | Status: DC

## 2013-03-17 NOTE — Patient Instructions (Signed)

## 2013-03-17 NOTE — Progress Notes (Signed)
Pre visit review using our clinic review tool, if applicable. No additional management support is needed unless otherwise documented below in the visit note. 

## 2013-03-17 NOTE — Progress Notes (Signed)
   Subjective:    Patient ID: Juan [name], male    DOB: December 14, 1955, 58 y.o.   MRN: [id number]  Hypertension This is a chronic problem. The current episode started more than 1 year ago. The problem has been gradually improving since onset. The problem is controlled. Pertinent negatives include no anxiety, blurred vision, chest pain, headaches, malaise/fatigue, neck pain, orthopnea, palpitations, peripheral edema, PND, shortness of breath or sweats. Agents associated with hypertension include steroids. Past treatments include angiotensin blockers and diuretics. The current treatment provides significant improvement. There are no compliance problems.       Review of Systems  Constitutional: Negative.  Negative for fever, chills, malaise/fatigue, diaphoresis, appetite change and fatigue.  HENT: Negative.   Eyes: Negative.  Negative for blurred vision.  Respiratory: Negative.  Negative for cough, choking, chest tightness, shortness of breath and stridor.   Cardiovascular: Negative.  Negative for chest pain, palpitations, orthopnea, leg swelling and PND.  Gastrointestinal: Negative.  Negative for nausea, abdominal pain, diarrhea, constipation and rectal pain.  Endocrine: Negative.   Genitourinary: Negative.  Negative for dysuria, urgency, frequency, flank pain, decreased urine volume, enuresis and difficulty urinating.  Musculoskeletal: Negative.  Negative for neck pain.  Skin: Negative.   Allergic/Immunologic: Negative.   Neurological: Negative.  Negative for headaches.  Hematological: Negative.  Negative for adenopathy. Does not bruise/bleed easily.  Psychiatric/Behavioral: Negative.        Objective:   Physical Exam  Vitals reviewed. Constitutional: He is oriented to person, place, and time. He appears well-developed and well-nourished. No distress.  HENT:  Head: Normocephalic and atraumatic.  Mouth/Throat: Oropharynx is clear and moist. No oropharyngeal exudate.  Eyes: Conjunctivae  are normal. Right eye exhibits no discharge. Left eye exhibits no discharge. No scleral icterus.  Neck: Normal range of motion. Neck supple. No JVD present. No tracheal deviation present. No thyromegaly present.  Cardiovascular: Normal rate, regular rhythm, normal heart sounds and intact distal pulses.  Exam reveals no gallop and no friction rub.   No murmur heard. Pulmonary/Chest: Effort normal and breath sounds normal. No stridor. No respiratory distress. He has no wheezes. He has no rales. He exhibits no tenderness.  Abdominal: Soft. Bowel sounds are normal. He exhibits no distension and no mass. There is no tenderness. There is no rebound and no guarding.  Musculoskeletal: Normal range of motion. He exhibits no edema and no tenderness.  Lymphadenopathy:    He has no cervical adenopathy.  Neurological: He is oriented to person, place, and time.  Skin: Skin is warm and dry. No rash noted. He is not diaphoretic. No erythema. No pallor.  Psychiatric: He has a normal mood and affect. His behavior is normal. Judgment and thought content normal.     Lab Results  Component Value Date   WBC 6.1 07/11/2012   HGB 14.2 07/11/2012   HCT 40.7 07/11/2012   PLT 327.0 07/11/2012   GLUCOSE 88 07/13/2012   CHOL 196 07/11/2012   TRIG 253.0* 07/11/2012   HDL 57.90 07/11/2012   LDLDIRECT 119.3 07/11/2012   LDLCALC 124* 05/22/2009   ALT 25 07/11/2012   AST 20 07/11/2012   NA 139 07/13/2012   K 3.6 07/13/2012   CL 103 07/13/2012   CREATININE 1.0 07/13/2012   BUN 19 07/13/2012   CO2 26 07/13/2012   TSH 0.57 07/11/2012   PSA 0.22 07/11/2012   HGBA1C 5.3 07/13/2012       Assessment & Plan:

## 2013-03-19 ENCOUNTER — Encounter: Payer: Self-pay | Admitting: Internal Medicine

## 2013-03-19 NOTE — Assessment & Plan Note (Signed)
He is doing well on axiron I will check his labs to see if there is any concern for complications (liver, red cells)

## 2013-03-19 NOTE — Assessment & Plan Note (Signed)
His BP is well controlled Today I will monitor his lytes and renal function

## 2013-03-20 ENCOUNTER — Telehealth: Payer: Self-pay | Admitting: Internal Medicine

## 2013-03-20 ENCOUNTER — Encounter: Payer: Self-pay | Admitting: Internal Medicine

## 2013-03-20 NOTE — Telephone Encounter (Signed)
Relevant patient education assigned to patient using Emmi. ° °

## 2013-05-11 ENCOUNTER — Ambulatory Visit (AMBULATORY_SURGERY_CENTER): Payer: Self-pay | Admitting: *Deleted

## 2013-05-11 VITALS — Ht 72.0 in | Wt 176.0 lb

## 2013-05-11 DIAGNOSIS — Z86010 Personal history of colonic polyps: Secondary | ICD-10-CM

## 2013-05-11 MED ORDER — NA SULFATE-K SULFATE-MG SULF 17.5-3.13-1.6 GM/177ML PO SOLN: 1.0000 | Freq: Once | ORAL | Status: DC

## 2013-05-11 NOTE — Progress Notes (Signed)
No allergies to eggs or soy. No problems with anesthesia.  Pt given Emmi instructions for colonoscopy  No oxygen use  No diet drug use  

## 2013-05-23 ENCOUNTER — Encounter: Payer: Self-pay | Admitting: Internal Medicine

## 2013-05-25 ENCOUNTER — Other Ambulatory Visit: Payer: Self-pay

## 2013-05-25 ENCOUNTER — Encounter: Payer: BC Managed Care – PPO | Admitting: Internal Medicine

## 2013-05-25 MED ORDER — TADALAFIL 20 MG PO TABS: ORAL_TABLET | ORAL | Status: DC

## 2013-06-01 ENCOUNTER — Other Ambulatory Visit: Payer: Self-pay | Admitting: Internal Medicine

## 2013-06-02 ENCOUNTER — Other Ambulatory Visit: Payer: Self-pay

## 2013-06-02 DIAGNOSIS — E291 Testicular hypofunction: Secondary | ICD-10-CM

## 2013-06-02 MED ORDER — TESTOSTERONE 30 MG/ACT TD SOLN: TRANSDERMAL | Status: DC

## 2013-06-02 NOTE — Telephone Encounter (Signed)
Rx printed and faxed to mail order

## 2013-06-13 ENCOUNTER — Encounter: Payer: Self-pay | Admitting: Internal Medicine

## 2013-06-13 ENCOUNTER — Ambulatory Visit (AMBULATORY_SURGERY_CENTER): Payer: BC Managed Care – PPO | Admitting: Internal Medicine

## 2013-06-13 VITALS — BP 102/67 | HR 62 | Temp 96.7°F | Resp 24 | Ht 72.0 in | Wt 176.0 lb

## 2013-06-13 DIAGNOSIS — Z86010 Personal history of colonic polyps: Secondary | ICD-10-CM

## 2013-06-13 DIAGNOSIS — D126 Benign neoplasm of colon, unspecified: Secondary | ICD-10-CM

## 2013-06-13 MED ORDER — SODIUM CHLORIDE 0.9 % IV SOLN: 500.0000 mL | INTRAVENOUS | Status: DC

## 2013-06-13 NOTE — Progress Notes (Signed)
Pt stable to RR 

## 2013-06-13 NOTE — Op Note (Signed)
[facility]  [address] [place], [address]   COLONOSCOPY PROCEDURE REPORT  PATIENT: Juan [name], Juan [name]  MR#: [id number] BIRTHDATE: 12-11-1955 , 45  yrs. old GENDER: Male ENDOSCOPIST: Gatha Mayer, MD, [facility] PROCEDURE DATE:  06/13/2013 PROCEDURE:   Colonoscopy with snare polypectomy First Screening Colonoscopy - Avg.  risk and is 50 yrs.  old or older - No.  Prior Negative Screening - Now for repeat screening. N/A  History of Adenoma - Now for follow-up colonoscopy & has been > or = to 3 yrs.  Yes hx of adenoma.  Has been 3 or more years since last colonoscopy.  Polyps Removed Today? Yes. ASA CLASS:   Class II INDICATIONS:Patient's personal history of adenomatous colon polyps.  MEDICATIONS: propofol (Diprivan) 200mg  IV, MAC sedation, administered by CRNA, and These medications were titrated to patient response per physician's verbal order  DESCRIPTION OF PROCEDURE:   After the risks benefits and alternatives of the procedure were thoroughly explained, informed consent was obtained.  A digital rectal exam revealed no abnormalities of the rectum, A digital rectal exam revealed no prostatic nodules, and A digital rectal exam revealed the prostate was not enlarged.   The LB [id number]  endoscope was introduced through the anus and advanced to the cecum, which was identified by both the appendix and ileocecal valve. No adverse events experienced.   The quality of the prep was excellent using Suprep  The instrument was then slowly withdrawn as the colon was fully examined.  COLON FINDINGS: A sessile polyp measuring 5 mm in size was found in the ascending colon.  A polypectomy was performed with a cold snare.  The resection was complete and the polyp tissue was completely retrieved.   The colon mucosa was otherwise normal.   A right colon retroflexion was performed.  Retroflexed views revealed no abnormalities. The time to cecum=1 minutes 48 seconds. Withdrawal  time=13 minutes 22 seconds.  The scope was withdrawn and the procedure completed. COMPLICATIONS: There were no complications.  ENDOSCOPIC IMPRESSION: 1.   Sessile polyp measuring 5 mm in size was found in the ascending colon; polypectomy was performed with a cold snare 2.   The colon mucosa was otherwise normal - excllent prep - hx adenoma removal 2010  RECOMMENDATIONS: 1.  Timing of repeat colonoscopy will be determined by pathology findings. 2.   Probably at least 5 yrs until next exam.   eSigned:  Gatha Mayer, MD, [facility] 06/13/2013 11:58 AM   cc: The Patient

## 2013-06-13 NOTE — Progress Notes (Signed)
Called to room to assist during endoscopic procedure.  Patient ID and intended procedure confirmed with present staff. Received instructions for my participation in the procedure from the performing physician.  

## 2013-06-13 NOTE — Patient Instructions (Addendum)
I found and removed one polyp today. It looks benign.  I will let you know pathology results and when to have another routine colonoscopy by mail. Should be at least 5 years.  I appreciate the opportunity to care for you. Gatha Mayer, MD, [facility]   Discharge instructions given with verbal understanding. Handout on polyps. Resume previous medications. YOU HAD AN ENDOSCOPIC PROCEDURE TODAY AT [facility]: Refer to the procedure report that was given to you for any specific questions about what was found during the examination.  If the procedure report does not answer your questions, please call your gastroenterologist to clarify.  If you requested that your care partner not be given the details of your procedure findings, then the procedure report has been included in a sealed envelope for you to review at your convenience later.  YOU SHOULD EXPECT: Some feelings of bloating in the abdomen. Passage of more gas than usual.  Walking can help get rid of the air that was put into your GI tract during the procedure and reduce the bloating. If you had a lower endoscopy (such as a colonoscopy or flexible sigmoidoscopy) you may notice spotting of blood in your stool or on the toilet paper. If you underwent a bowel prep for your procedure, then you may not have a normal bowel movement for a few days.  DIET: Your first meal following the procedure should be a light meal and then it is ok to progress to your normal diet.  A half-sandwich or bowl of soup is an example of a good first meal.  Heavy or fried foods are harder to digest and may make you feel nauseous or bloated.  Likewise meals heavy in dairy and vegetables can cause extra gas to form and this can also increase the bloating.  Drink plenty of fluids but you should avoid alcoholic beverages for 24 hours.  ACTIVITY: Your care partner should take you home directly after the procedure.  You should plan to take it easy, moving slowly for  the rest of the day.  You can resume normal activity the day after the procedure however you should NOT DRIVE or use heavy machinery for 24 hours (because of the sedation medicines used during the test).    SYMPTOMS TO REPORT IMMEDIATELY: A gastroenterologist can be reached at any hour.  During normal business hours, 8:30 AM to 5:00 PM Monday through Friday, call [phone].  After hours and on weekends, please call the GI answering service at [phone] who will take a message and have the physician on call contact you.   Following lower endoscopy (colonoscopy or flexible sigmoidoscopy):  Excessive amounts of blood in the stool  Significant tenderness or worsening of abdominal pains  Swelling of the abdomen that is new, acute  Fever of 100F or higher  FOLLOW UP: If any biopsies were taken you will be contacted by phone or by letter within the next 1-3 weeks.  Call your gastroenterologist if you have not heard about the biopsies in 3 weeks.  Our staff will call the home number listed on your records the next business day following your procedure to check on you and address any questions or concerns that you may have at that time regarding the information given to you following your procedure. This is a courtesy call and so if there is no answer at the home number and we have not heard from you through the emergency physician on call, we will assume  that you have returned to your regular daily activities without incident.  SIGNATURES/CONFIDENTIALITY: You and/or your care partner have signed paperwork which will be entered into your electronic medical record.  These signatures attest to the fact that that the information above on your After Visit Summary has been reviewed and is understood.  Full responsibility of the confidentiality of this discharge information lies with you and/or your care-partner.

## 2013-06-14 ENCOUNTER — Telehealth: Payer: Self-pay

## 2013-06-14 NOTE — Telephone Encounter (Signed)
  Follow up Call-  Call back number 06/13/2013  Post procedure Call Back phone  # [phone]  Permission to leave phone message Yes     Patient questions:  Do you have a fever, pain , or abdominal swelling? no Pain Score  0 *  Have you tolerated food without any problems? yes  Have you been able to return to your normal activities? yes  Do you have any questions about your discharge instructions: Diet   no Medications  no Follow up visit  no  Do you have questions or concerns about your Care? no  Actions: * If pain score is 4 or above: No action needed, pain <4.  No problems per the pt. Maw

## 2013-06-20 ENCOUNTER — Encounter: Payer: Self-pay | Admitting: Internal Medicine

## 2013-06-20 NOTE — Progress Notes (Signed)
Quick Note:  5 mm tubular adenoma Repeat colonoscopy 2020 ______

## 2013-07-05 ENCOUNTER — Other Ambulatory Visit: Payer: Self-pay | Admitting: *Deleted

## 2013-07-05 MED ORDER — LOSARTAN POTASSIUM-HCTZ 50-12.5 MG PO TABS: ORAL_TABLET | ORAL | Status: DC

## 2013-07-05 MED ORDER — TRAZODONE HCL 50 MG PO TABS: ORAL_TABLET | ORAL | Status: DC

## 2013-07-05 NOTE — Telephone Encounter (Signed)
Left msg on triage needing renewal on his trazodone & [facility]artan [facility]. Sent Losartan pls advise on trazodone...Johny Chess

## 2013-07-12 ENCOUNTER — Encounter: Payer: BC Managed Care – PPO | Admitting: Internal Medicine

## 2013-07-18 ENCOUNTER — Other Ambulatory Visit: Payer: Self-pay | Admitting: Internal Medicine

## 2013-08-04 ENCOUNTER — Encounter: Payer: Self-pay | Admitting: Internal Medicine

## 2013-08-04 ENCOUNTER — Ambulatory Visit (INDEPENDENT_AMBULATORY_CARE_PROVIDER_SITE_OTHER): Payer: Self-pay | Admitting: Internal Medicine

## 2013-08-04 ENCOUNTER — Other Ambulatory Visit (INDEPENDENT_AMBULATORY_CARE_PROVIDER_SITE_OTHER): Payer: BC Managed Care – PPO

## 2013-08-04 VITALS — BP 120/70 | HR 56 | Temp 98.2°F | Resp 16 | Ht 72.0 in | Wt 174.4 lb

## 2013-08-04 DIAGNOSIS — Z0000 Encounter for general adult medical examination without abnormal findings: Secondary | ICD-10-CM

## 2013-08-04 DIAGNOSIS — G4700 Insomnia, unspecified: Secondary | ICD-10-CM

## 2013-08-04 DIAGNOSIS — N528 Other male erectile dysfunction: Secondary | ICD-10-CM

## 2013-08-04 DIAGNOSIS — I10 Essential (primary) hypertension: Secondary | ICD-10-CM

## 2013-08-04 DIAGNOSIS — R6882 Decreased libido: Secondary | ICD-10-CM

## 2013-08-04 LAB — COMPREHENSIVE METABOLIC PANEL
ALK PHOS: 22 U/L — AB (ref 39–117)
ALT: 20 U/L (ref 0–53)
AST: 26 U/L (ref 0–37)
Albumin: 4.3 g/dL (ref 3.5–5.2)
BUN: 19 mg/dL (ref 6–23)
CO2: 28 mEq/L (ref 19–32)
Calcium: 9.3 mg/dL (ref 8.4–10.5)
Chloride: 102 mEq/L (ref 96–112)
Creatinine, Ser: 0.9 mg/dL (ref 0.4–1.5)
GFR: 96.99 mL/min (ref >60.00)
Glucose, Bld: 92 mg/dL (ref 70–99)
Potassium: 4 mEq/L (ref 3.5–5.1)
SODIUM: 137 meq/L (ref 135–145)
Total Bilirubin: 0.6 mg/dL (ref 0.2–1.2)
Total Protein: 6.7 g/dL (ref 6.0–8.3)

## 2013-08-04 LAB — LIPID PANEL
Cholesterol: 255 mg/dL — ABNORMAL HIGH (ref 0–200)
HDL: 59.4 mg/dL (ref >39.00)
LDL Cholesterol: 177 mg/dL — ABNORMAL HIGH (ref 0–99)
NONHDL: 195.6
Total CHOL/HDL Ratio: 4
Triglycerides: 91 mg/dL (ref 0.0–149.0)
VLDL: 18.2 mg/dL (ref 0.0–40.0)

## 2013-08-04 LAB — CBC WITH DIFFERENTIAL/PLATELET
BASOS PCT: 0.4 % (ref 0.0–3.0)
Basophils Absolute: 0 10*3/uL (ref 0.0–0.1)
EOS ABS: 0.2 10*3/uL (ref 0.0–0.7)
Eosinophils Relative: 3.8 % (ref 0.0–5.0)
HEMATOCRIT: 42.4 % (ref 39.0–52.0)
Hemoglobin: 14.4 g/dL (ref 13.0–17.0)
LYMPHS ABS: 1.3 10*3/uL (ref 0.7–4.0)
Lymphocytes Relative: 26.9 % (ref 12.0–46.0)
MCHC: 33.9 g/dL (ref 30.0–36.0)
MCV: 88.3 fl (ref 78.0–100.0)
MONO ABS: 0.5 10*3/uL (ref 0.1–1.0)
Monocytes Relative: 10.7 % (ref 3.0–12.0)
NEUTROS ABS: 2.9 10*3/uL (ref 1.4–7.7)
NEUTROS PCT: 58.2 % (ref 43.0–77.0)
Platelets: 355 10*3/uL (ref 150.0–400.0)
RBC: 4.8 Mil/uL (ref 4.22–5.81)
RDW: 13.9 % (ref 11.5–15.5)
WBC: 5 10*3/uL (ref 4.0–10.5)

## 2013-08-04 LAB — TSH: TSH: 0.91 u[IU]/mL (ref 0.35–4.50)

## 2013-08-04 LAB — PSA: PSA: 0.17 ng/mL (ref 0.10–4.00)

## 2013-08-04 LAB — FECAL OCCULT BLOOD, GUAIAC: Fecal Occult Blood: NEGATIVE

## 2013-08-04 MED ORDER — TADALAFIL 5 MG PO TABS: 5.0000 mg | ORAL_TABLET | Freq: Every day | ORAL | Status: DC | PRN

## 2013-08-04 MED ORDER — TADALAFIL 5 MG PO TABS
5.0000 mg | ORAL_TABLET | Freq: Every day | ORAL | Status: DC | PRN
Start: 2013-08-04 — End: 2013-08-04

## 2013-08-04 NOTE — Patient Instructions (Signed)

## 2013-08-04 NOTE — Progress Notes (Signed)
Subjective:    Patient ID: Juan [name], male    DOB: 09-Jun-1955, 58 y.o.   MRN: [id number]  Hypertension This is a chronic problem. The current episode started more than 1 year ago. The problem is unchanged. The problem is controlled. Pertinent negatives include no anxiety, blurred vision, chest pain, headaches, malaise/fatigue, neck pain, orthopnea, palpitations, peripheral edema, PND, shortness of breath or sweats. Agents associated with hypertension include NSAIDs and steroids. Past treatments include angiotensin blockers and diuretics. The current treatment provides significant improvement. There are no compliance problems.       Review of Systems  Constitutional: Negative.  Negative for fever, chills, malaise/fatigue, diaphoresis, appetite change and fatigue.  HENT: Negative.   Eyes: Negative.  Negative for blurred vision.  Respiratory: Negative.  Negative for cough, choking, chest tightness, shortness of breath, wheezing and stridor.   Cardiovascular: Negative.  Negative for chest pain, palpitations, orthopnea, leg swelling and PND.  Gastrointestinal: Negative.  Negative for nausea, vomiting, abdominal pain, diarrhea, constipation and blood in stool.  Endocrine: Negative.   Genitourinary: Negative.  Negative for scrotal swelling, difficulty urinating and testicular pain.  Musculoskeletal: Negative.  Negative for neck pain.  Skin: Negative.  Negative for rash.  Allergic/Immunologic: Negative.   Neurological: Negative.  Negative for dizziness, tremors, speech difficulty, weakness, light-headedness, numbness and headaches.  Hematological: Negative.  Negative for adenopathy. Does not bruise/bleed easily.  Psychiatric/Behavioral: Negative.        Objective:   Physical Exam  Vitals reviewed. Constitutional: He is oriented to person, place, and time. He appears well-developed and well-nourished. No distress.  HENT:  Head: Normocephalic and atraumatic.  Mouth/Throat: Oropharynx is  clear and moist. No oropharyngeal exudate.  Eyes: Conjunctivae are normal. Right eye exhibits no discharge. Left eye exhibits no discharge. No scleral icterus.  Neck: Normal range of motion. Neck supple. No JVD present. No tracheal deviation present. No thyromegaly present.  Cardiovascular: Normal rate, regular rhythm, normal heart sounds and intact distal pulses.  Exam reveals no gallop and no friction rub.   No murmur heard. Pulmonary/Chest: Effort normal and breath sounds normal. No stridor. No respiratory distress. He has no wheezes. He has no rales. He exhibits no tenderness.  Abdominal: Soft. Bowel sounds are normal. He exhibits no distension and no mass. There is no tenderness. There is no rebound and no guarding. Hernia confirmed negative in the right inguinal area and confirmed negative in the left inguinal area.  Genitourinary: Rectum normal, prostate normal, testes normal and penis normal. Rectal exam shows no external hemorrhoid, no internal hemorrhoid, no fissure, no mass, no tenderness and anal tone normal. Guaiac negative stool. Prostate is not enlarged and not tender. Right testis shows no mass, no swelling and no tenderness. Right testis is descended. Left testis shows no mass, no swelling and no tenderness. Left testis is descended. Circumcised. No penile erythema or penile tenderness. No discharge found.  Musculoskeletal: Normal range of motion. He exhibits no edema and no tenderness.  Lymphadenopathy:    He has no cervical adenopathy.       Right: No inguinal adenopathy present.       Left: No inguinal adenopathy present.  Neurological: He is oriented to person, place, and time.  Skin: Skin is warm and dry. No rash noted. He is not diaphoretic. No erythema. No pallor.  Psychiatric: He has a normal mood and affect. His behavior is normal. Judgment and thought content normal.     Lab Results  Component Value Date   WBC  5.6 03/17/2013   HGB 14.4 03/17/2013   HCT 42.0 03/17/2013     PLT 339.0 03/17/2013   GLUCOSE 102* 03/17/2013   CHOL 196 07/11/2012   TRIG 253.0* 07/11/2012   HDL 57.90 07/11/2012   LDLDIRECT 119.3 07/11/2012   LDLCALC 124* 05/22/2009   ALT 26 03/17/2013   AST 23 03/17/2013   NA 136 03/17/2013   K 3.6 03/17/2013   CL 103 03/17/2013   CREATININE 1.0 03/17/2013   BUN 24* 03/17/2013   CO2 25 03/17/2013   TSH 0.57 07/11/2012   PSA 0.22 07/11/2012   HGBA1C 5.3 07/13/2012       Assessment & Plan:

## 2013-08-04 NOTE — Progress Notes (Signed)
Pre visit review using our clinic review tool, if applicable. No additional management support is needed unless otherwise documented below in the visit note. 

## 2013-08-05 NOTE — Assessment & Plan Note (Signed)
His BP is well controlled 

## 2013-08-05 NOTE — Assessment & Plan Note (Signed)
Exam done Vaccines were reviewed Labs ordered Pt ed material was given 

## 2013-08-18 ENCOUNTER — Telehealth: Payer: Self-pay

## 2013-08-18 NOTE — Telephone Encounter (Signed)
The PA for Cialis was denied b/c: quantity requested exceeds the plan's limitation of 6 tablets per 30 days for the diagnosis of ED wo documentation that the patient tried Cialis 2.5 mg once daily wo regard to timing of sexual activity.

## 2013-09-13 ENCOUNTER — Telehealth: Payer: Self-pay | Admitting: Internal Medicine

## 2013-09-13 DIAGNOSIS — E291 Testicular hypofunction: Secondary | ICD-10-CM

## 2013-09-13 MED ORDER — TESTOSTERONE CYPIONATE 200 MG/ML IM SOLN: 200.0000 mg | INTRAMUSCULAR | Status: DC

## 2013-09-13 NOTE — Telephone Encounter (Signed)
See Rx 

## 2013-09-13 NOTE — Telephone Encounter (Signed)
Pt called stated that he is losing his insurance and he is trying to get something cheaper for testosterone. Pt stated that drug store advise him to get injection it would be cheaper and he can give self injection if it is going to be cheaper for him. Please advise.  [facility] on [address] is the drug store.

## 2013-09-18 ENCOUNTER — Telehealth: Payer: Self-pay | Admitting: *Deleted

## 2013-09-18 ENCOUNTER — Other Ambulatory Visit: Payer: Self-pay | Admitting: *Deleted

## 2013-09-18 MED ORDER — TESTOSTERONE 30 MG/ACT TD SOLN: TRANSDERMAL | Status: DC

## 2013-09-18 NOTE — Telephone Encounter (Signed)
[facility] requesting refill for axiron 30mg . LOV 08/04/2013  Ok to refill?

## 2013-09-18 NOTE — Telephone Encounter (Signed)
No, this has been changed

## 2013-09-21 NOTE — Telephone Encounter (Signed)
Pt is aware to come pick up this rx.

## 2013-09-22 ENCOUNTER — Other Ambulatory Visit: Payer: Self-pay

## 2014-03-14 ENCOUNTER — Telehealth: Payer: Self-pay | Admitting: Internal Medicine

## 2014-03-14 DIAGNOSIS — E291 Testicular hypofunction: Secondary | ICD-10-CM

## 2014-03-14 DIAGNOSIS — N528 Other male erectile dysfunction: Secondary | ICD-10-CM

## 2014-03-14 MED ORDER — TRAZODONE HCL 50 MG PO TABS: ORAL_TABLET | ORAL | Status: DC

## 2014-03-14 MED ORDER — TESTOSTERONE CYPIONATE 200 MG/ML IM SOLN: 200.0000 mg | INTRAMUSCULAR | Status: DC

## 2014-03-14 MED ORDER — TADALAFIL 5 MG PO TABS: 5.0000 mg | ORAL_TABLET | Freq: Every day | ORAL | Status: DC | PRN

## 2014-03-14 MED ORDER — LOSARTAN POTASSIUM-HCTZ 50-12.5 MG PO TABS: ORAL_TABLET | ORAL | Status: DC

## 2014-03-14 NOTE — Telephone Encounter (Signed)
Patient is wanted to switch pharmacy to [facility] on [address] for the following RX  testosterone cypionate (DEPOTESTOTERONE CYPIONATE) 200 MG/ML injection [[id number]]  traZODone (DESYREL) 50 MG tablet [[id number]]  tadalafil (CIALIS) 5 MG tablet [[id number]]    losartan-hydrochlorothiazide (HYZAAR) 50-12.5 MG per tablet [[id number]]      Patient is also requesting 90 day supply for all if possible. I did advise patient that he would need to be seen for follow up since it has been 6 months. He advised that he is currently uninsured.

## 2014-03-14 NOTE — Telephone Encounter (Signed)
Rx's written. 

## 2014-03-14 NOTE — Telephone Encounter (Signed)
Notified pt md ok refills printed script out. Pt is wanting rx's to be sent to [facility]. Inform pt will fax to [facility].../lmb

## 2014-06-19 ENCOUNTER — Encounter: Payer: Self-pay | Admitting: Internal Medicine

## 2014-08-02 ENCOUNTER — Telehealth: Payer: Self-pay | Admitting: Internal Medicine

## 2014-08-02 ENCOUNTER — Other Ambulatory Visit: Payer: Self-pay | Admitting: Internal Medicine

## 2014-08-02 DIAGNOSIS — G4700 Insomnia, unspecified: Secondary | ICD-10-CM

## 2014-08-02 MED ORDER — TRAZODONE HCL 50 MG PO TABS: ORAL_TABLET | ORAL | Status: DC

## 2014-08-02 NOTE — Telephone Encounter (Signed)
done

## 2014-08-02 NOTE — Telephone Encounter (Signed)
Patient states that he is in [place] for the qweek and for got his traZODone (DESYREL) 50 MG tablet [[id number]] . He is requesting that a small amount be sent in to the local pharmacy.  [facility]  [phone]

## 2014-08-23 ENCOUNTER — Encounter: Payer: Self-pay | Admitting: Internal Medicine

## 2014-08-23 ENCOUNTER — Telehealth: Payer: Self-pay | Admitting: Internal Medicine

## 2014-08-23 ENCOUNTER — Ambulatory Visit (INDEPENDENT_AMBULATORY_CARE_PROVIDER_SITE_OTHER): Payer: PRIVATE HEALTH INSURANCE | Admitting: Internal Medicine

## 2014-08-23 ENCOUNTER — Other Ambulatory Visit (INDEPENDENT_AMBULATORY_CARE_PROVIDER_SITE_OTHER): Payer: PRIVATE HEALTH INSURANCE

## 2014-08-23 VITALS — BP 118/78 | HR 64 | Temp 98.1°F | Resp 16 | Ht 72.0 in | Wt 177.0 lb

## 2014-08-23 DIAGNOSIS — Z0000 Encounter for general adult medical examination without abnormal findings: Secondary | ICD-10-CM | POA: Diagnosis not present

## 2014-08-23 LAB — PSA: PSA: 0.24 ng/mL (ref 0.10–4.00)

## 2014-08-23 LAB — COMPREHENSIVE METABOLIC PANEL
ALT: 25 U/L (ref 0–53)
AST: 20 U/L (ref 0–37)
Albumin: 4.3 g/dL (ref 3.5–5.2)
Alkaline Phosphatase: 26 U/L — ABNORMAL LOW (ref 39–117)
BUN: 21 mg/dL (ref 6–23)
CHLORIDE: 101 meq/L (ref 96–112)
CO2: 30 meq/L (ref 19–32)
Calcium: 9.7 mg/dL (ref 8.4–10.5)
Creatinine, Ser: 0.98 mg/dL (ref 0.40–1.50)
GFR: 83.12 mL/min (ref >60.00)
GLUCOSE: 102 mg/dL — AB (ref 70–99)
POTASSIUM: 4.6 meq/L (ref 3.5–5.1)
SODIUM: 138 meq/L (ref 135–145)
TOTAL PROTEIN: 6.8 g/dL (ref 6.0–8.3)
Total Bilirubin: 0.4 mg/dL (ref 0.2–1.2)

## 2014-08-23 LAB — CBC WITH DIFFERENTIAL/PLATELET
BASOS PCT: 1.4 % (ref 0.0–3.0)
Basophils Absolute: 0.1 10*3/uL (ref 0.0–0.1)
EOS PCT: 6 % — AB (ref 0.0–5.0)
Eosinophils Absolute: 0.3 10*3/uL (ref 0.0–0.7)
HCT: 44.9 % (ref 39.0–52.0)
Hemoglobin: 15.3 g/dL (ref 13.0–17.0)
LYMPHS ABS: 0.9 10*3/uL (ref 0.7–4.0)
Lymphocytes Relative: 21.8 % (ref 12.0–46.0)
MCHC: 34 g/dL (ref 30.0–36.0)
MCV: 87.3 fl (ref 78.0–100.0)
MONO ABS: 0.9 10*3/uL (ref 0.1–1.0)
Monocytes Relative: 20.2 % — ABNORMAL HIGH (ref 3.0–12.0)
NEUTROS ABS: 2.2 10*3/uL (ref 1.4–7.7)
NEUTROS PCT: 50.6 % (ref 43.0–77.0)
Platelets: 334 10*3/uL (ref 150.0–400.0)
RBC: 5.14 Mil/uL (ref 4.22–5.81)
RDW: 14.2 % (ref 11.5–15.5)
WBC: 4.3 10*3/uL (ref 4.0–10.5)

## 2014-08-23 LAB — LIPID PANEL
Cholesterol: 236 mg/dL — ABNORMAL HIGH (ref 0–200)
HDL: 49.4 mg/dL (ref >39.00)
LDL CALC: 153 mg/dL — AB (ref 0–99)
NONHDL: 186.84
Total CHOL/HDL Ratio: 5
Triglycerides: 170 mg/dL — ABNORMAL HIGH (ref 0.0–149.0)
VLDL: 34 mg/dL (ref 0.0–40.0)

## 2014-08-23 LAB — FECAL OCCULT BLOOD, GUAIAC: FECAL OCCULT BLD: NEGATIVE

## 2014-08-23 LAB — TSH: TSH: 0.99 u[IU]/mL (ref 0.35–4.50)

## 2014-08-23 NOTE — Telephone Encounter (Signed)
Patient is requesting labs to be mailed to him once available.

## 2014-08-23 NOTE — Patient Instructions (Signed)

## 2014-08-23 NOTE — Progress Notes (Signed)
Subjective:  Patient ID: Juan [name], male    DOB: January 22, 1955  Age: 59 y.o. MRN: [id number]  CC: Annual Exam and Hypertension   HPI Juan [name] presents for a CPX - he feels well and offers no complaints.  Outpatient Prescriptions Prior to Visit  Medication Sig Dispense Refill  . aspirin 81 MG tablet Take 81 mg by mouth daily.      . celecoxib (CELEBREX) 200 MG capsule take 1 capsule by mouth once daily 90 capsule 3  . fish oil-omega-3 fatty acids 1000 MG capsule Take 2 g by mouth daily.      Marland Kitchen losartan-hydrochlorothiazide (HYZAAR) 50-12.5 MG per tablet TAKE 1 TABLET DAILY 90 tablet 3  . Multiple Vitamin (MULTIVITAMIN PO) Take by mouth daily.      . tadalafil (CIALIS) 5 MG tablet Take 1 tablet (5 mg total) by mouth daily as needed for erectile dysfunction. 90 tablet 3  . testosterone cypionate (DEPOTESTOTERONE CYPIONATE) 200 MG/ML injection Inject 1 mL (200 mg total) into the muscle every 14 (fourteen) days. 10 mL 1  . traZODone (DESYREL) 50 MG tablet TAKE 1 TO 2 TABLETS AT BEDTIME AS NEEDED 20 tablet 0  . UNABLE TO FIND Med Name: Tribulus Terrestris . Takes 2 capsules daily     No facility-administered medications prior to visit.    ROS Review of Systems  Constitutional: Negative.  Negative for chills, appetite change and fatigue.  HENT: Negative.   Eyes: Negative.   Respiratory: Negative.  Negative for cough, choking, shortness of breath and stridor.   Cardiovascular: Negative.  Negative for chest pain and palpitations.  Gastrointestinal: Negative.  Negative for nausea, vomiting, abdominal pain, diarrhea and blood in stool.  Endocrine: Negative.   Genitourinary: Negative.  Negative for difficulty urinating.  Musculoskeletal: Negative.   Skin: Negative.   Allergic/Immunologic: Negative.   Neurological: Negative.  Negative for dizziness.  Hematological: Negative.  Negative for adenopathy. Does not bruise/bleed easily.  Psychiatric/Behavioral: Negative.   All other systems  reviewed and are negative.   Objective:  BP 118/78 mmHg  Pulse 64  Temp(Src) 98.1 F (36.7 C) (Oral)  Resp 16  Ht 6' (1.829 m)  Wt 177 lb (80.287 kg)  BMI 24.00 kg/m2  SpO2 97%  BP Readings from Last 3 Encounters:  08/23/14 118/78  08/04/13 120/70  06/13/13 102/67    Wt Readings from Last 3 Encounters:  08/23/14 177 lb (80.287 kg)  08/04/13 174 lb 6.4 oz (79.107 kg)  06/13/13 176 lb (79.833 kg)    Physical Exam  Constitutional: He is oriented to person, place, and time. He appears well-developed and well-nourished.  Non-toxic appearance. He does not have a sickly appearance. He does not appear ill. No distress.  HENT:  Head: Normocephalic and atraumatic.  Mouth/Throat: Oropharynx is clear and moist. No oropharyngeal exudate.  Eyes: Conjunctivae are normal. Right eye exhibits no discharge. Left eye exhibits no discharge. No scleral icterus.  Neck: Normal range of motion. Neck supple. No JVD present. No tracheal deviation present. No thyromegaly present.  Cardiovascular: Normal rate, regular rhythm, normal heart sounds and intact distal pulses.  Exam reveals no gallop and no friction rub.   No murmur heard. Pulmonary/Chest: Effort normal and breath sounds normal. No stridor. No respiratory distress. He has no wheezes. He has no rales. He exhibits no tenderness.  Abdominal: Bowel sounds are normal. He exhibits no distension and no mass. There is no tenderness. There is no rebound and no guarding. Hernia confirmed negative in the right  inguinal area and confirmed negative in the left inguinal area.  Genitourinary: Rectum normal, prostate normal, testes normal and penis normal. Rectal exam shows no external hemorrhoid, no internal hemorrhoid, no fissure, no mass, no tenderness and anal tone normal. Guaiac negative stool. Prostate is not enlarged and not tender. Right testis shows no mass, no swelling and no tenderness. Right testis is descended. Left testis shows no mass, no swelling  and no tenderness. Left testis is descended. Cremasteric reflex is not absent on the left side. Circumcised. No penile erythema or penile tenderness. No discharge found.  Musculoskeletal: Normal range of motion. He exhibits no edema or tenderness.  Lymphadenopathy:    He has no cervical adenopathy.       Right: No inguinal adenopathy present.       Left: No inguinal adenopathy present.  Neurological: He is oriented to person, place, and time.  Skin: Skin is warm and dry. No rash noted. He is not diaphoretic. No erythema. No pallor.  Psychiatric: He has a normal mood and affect. His behavior is normal. Judgment and thought content normal.    Lab Results  Component Value Date   WBC 4.3 08/23/2014   HGB 15.3 08/23/2014   HCT 44.9 08/23/2014   PLT 334.0 08/23/2014   GLUCOSE 102* 08/23/2014   CHOL 236* 08/23/2014   TRIG 170.0* 08/23/2014   HDL 49.40 08/23/2014   LDLDIRECT 119.3 07/11/2012   LDLCALC 153* 08/23/2014   ALT 25 08/23/2014   AST 20 08/23/2014   NA 138 08/23/2014   K 4.6 08/23/2014   CL 101 08/23/2014   CREATININE 0.98 08/23/2014   BUN 21 08/23/2014   CO2 30 08/23/2014   TSH 0.99 08/23/2014   PSA 0.24 08/23/2014   HGBA1C 5.3 07/13/2012    No results found.  Assessment & Plan:   Jatorian was seen today for annual exam and hypertension.  Diagnoses and all orders for this visit:  Routine general medical examination at a health care facility- exam done, labs reviewed, vaccines were reviewed, pt ed material was given. -     Lipid panel; Future -     Comprehensive metabolic panel; Future -     CBC with Differential/Platelet; Future -     PSA; Future -     TSH; Future   I am having Juan [name] maintain his Multiple Vitamin (MULTIVITAMIN PO), fish oil-omega-3 fatty acids, aspirin, UNABLE TO FIND, celecoxib, testosterone cypionate, tadalafil, losartan-hydrochlorothiazide, traZODone, BD DISP NEEDLES, and B-D 3CC LUER-LOK SYR 23GX1".  Meds ordered this encounter    Medications  . BD DISP NEEDLES 18G X 1-1/2" MISC    Sig:     Refill:  0  . B-D 3CC LUER-LOK SYR 23GX1" 23G X 1" 3 ML MISC    Sig:     Refill:  0     Follow-up: Return in about 6 months (around 02/23/2015).  Scarlette Calico, MD

## 2014-08-23 NOTE — Progress Notes (Signed)
Pre visit review using our clinic review tool, if applicable. No additional management support is needed unless otherwise documented below in the visit note. 

## 2014-08-28 ENCOUNTER — Other Ambulatory Visit: Payer: Self-pay | Admitting: Internal Medicine

## 2014-08-28 DIAGNOSIS — E785 Hyperlipidemia, unspecified: Secondary | ICD-10-CM | POA: Insufficient documentation

## 2014-08-28 MED ORDER — ATORVASTATIN CALCIUM 20 MG PO TABS: 20.0000 mg | ORAL_TABLET | Freq: Every day | ORAL | Status: DC

## 2014-08-28 NOTE — Telephone Encounter (Signed)
I spoke with pt he has not received results letter yet. I did confirm address and it is correct It will probably arrive today or tomorrow. If not it will be resent. I did read him your note about cholesterol and he is willing to try some cholesterol meds. Pt was wondering if you knew of something that was cheap or possibly some samples to try I believe he said he does not have any or little prescription benefits.

## 2014-08-28 NOTE — Telephone Encounter (Signed)
Ok to send? Any additional notes?

## 2014-08-28 NOTE — Telephone Encounter (Signed)
Pt informed of copay card and script. Also a copy of his labs will be available as well.

## 2014-08-28 NOTE — Telephone Encounter (Signed)
I sent him a letter almost a week ago. Please be certain that we have the correct address. You can send a letter again.

## 2014-12-05 ENCOUNTER — Encounter: Payer: Self-pay | Admitting: Internal Medicine

## 2014-12-05 ENCOUNTER — Ambulatory Visit (INDEPENDENT_AMBULATORY_CARE_PROVIDER_SITE_OTHER): Payer: PRIVATE HEALTH INSURANCE | Admitting: Internal Medicine

## 2014-12-05 VITALS — BP 128/84 | HR 61 | Temp 97.6°F | Resp 16 | Ht 72.0 in | Wt 175.0 lb

## 2014-12-05 DIAGNOSIS — M545 Low back pain, unspecified: Secondary | ICD-10-CM | POA: Insufficient documentation

## 2014-12-05 DIAGNOSIS — H029 Unspecified disorder of eyelid: Secondary | ICD-10-CM | POA: Diagnosis not present

## 2014-12-05 DIAGNOSIS — Z1159 Encounter for screening for other viral diseases: Secondary | ICD-10-CM

## 2014-12-05 DIAGNOSIS — M5126 Other intervertebral disc displacement, lumbar region: Secondary | ICD-10-CM | POA: Insufficient documentation

## 2014-12-05 MED ORDER — METHOCARBAMOL 750 MG PO TABS: 750.0000 mg | ORAL_TABLET | Freq: Three times a day (TID) | ORAL | Status: DC

## 2014-12-05 MED ORDER — METHYLPREDNISOLONE ACETATE 80 MG/ML IJ SUSP
120.0000 mg | Freq: Once | INTRAMUSCULAR | Status: AC
  Administered 2014-12-05: 120 mg via INTRAMUSCULAR

## 2014-12-05 NOTE — Progress Notes (Signed)
Pre visit review using our clinic review tool, if applicable. No additional management support is needed unless otherwise documented below in the visit note. 

## 2014-12-05 NOTE — Patient Instructions (Signed)

## 2014-12-05 NOTE — Progress Notes (Signed)
Subjective:  Patient ID: Juan [name], male    DOB: [birth year]  Age: 59 y.o. MRN: [id number]  CC: Back Pain   HPI Juan [name] presents for recurrent episodes of low back pain. This hass happened to him intermittently off and on through the years. He previously saw an orthopedic surgeon named Dr. Maxie [name] and he tells me that a workup including x-rays was unremarkable. He was feeling well until about 3 days ago when he was doing a yoga pose, he was bending and leaning forward and reaching and he felt an achy sensation develop in his low back. Now he has spasms in his lower back. He has been taking an anti-inflammatory and tramadol with only minimal symptom relief. The back pain does not radiate and is not associated with any numbness, weakness, tingling in his legs.  Outpatient Prescriptions Prior to Visit  Medication Sig Dispense Refill  . aspirin 81 MG tablet Take 81 mg by mouth daily.      Marland Kitchen atorvastatin (LIPITOR) 20 MG tablet Take 1 tablet (20 mg total) by mouth daily. 90 tablet 3  . B-D 3CC LUER-LOK SYR 23GX1" 23G X 1" 3 ML MISC   0  . BD DISP NEEDLES 18G X 1-1/2" MISC   0  . fish oil-omega-3 fatty acids 1000 MG capsule Take 2 g by mouth daily.      Marland Kitchen losartan-hydrochlorothiazide (HYZAAR) 50-12.5 MG per tablet TAKE 1 TABLET DAILY 90 tablet 3  . Multiple Vitamin (MULTIVITAMIN PO) Take by mouth daily.      . tadalafil (CIALIS) 5 MG tablet Take 1 tablet (5 mg total) by mouth daily as needed for erectile dysfunction. 90 tablet 3  . testosterone cypionate (DEPOTESTOTERONE CYPIONATE) 200 MG/ML injection Inject 1 mL (200 mg total) into the muscle every 14 (fourteen) days. 10 mL 1  . traZODone (DESYREL) 50 MG tablet TAKE 1 TO 2 TABLETS AT BEDTIME AS NEEDED 20 tablet 0  . UNABLE TO FIND Med Name: Tribulus Terrestris . Takes 2 capsules daily    . celecoxib (CELEBREX) 200 MG capsule take 1 capsule by mouth once daily 90 capsule 3   No facility-administered medications prior to visit.     ROS Review of Systems  Constitutional: Negative.  Negative for fever and chills.  HENT: Negative.   Eyes: Negative.   Respiratory: Negative.  Negative for cough, choking, chest tightness, shortness of breath and stridor.   Cardiovascular: Negative.  Negative for chest pain, palpitations and leg swelling.  Gastrointestinal: Negative.  Negative for nausea, vomiting, abdominal pain, diarrhea and constipation.  Endocrine: Negative.   Genitourinary: Negative.  Negative for difficulty urinating.  Musculoskeletal: Positive for back pain. Negative for myalgias, joint swelling, arthralgias, gait problem, neck pain and neck stiffness.  Skin: Negative.  Negative for rash.       He complains about a lesion over his left lower eyelid  Allergic/Immunologic: Negative.   Neurological: Negative.  Negative for dizziness, speech difficulty, weakness and numbness.  Hematological: Negative.   Psychiatric/Behavioral: Negative.     Objective:  BP 128/84 mmHg  Pulse 61  Temp(Src) 97.6 F (36.4 C) (Oral)  Resp 16  Ht 6' (1.829 m)  Wt 175 lb (79.379 kg)  BMI 23.73 kg/m2  SpO2 96%  BP Readings from Last 3 Encounters:  12/05/14 128/84  08/23/14 118/78  08/04/13 120/70    Wt Readings from Last 3 Encounters:  12/05/14 175 lb (79.379 kg)  08/23/14 177 lb (80.287 kg)  08/04/13 174 lb 6.4 oz (79.107  kg)    Physical Exam  Constitutional: No distress.  HENT:  Mouth/Throat: Oropharynx is clear and moist. No oropharyngeal exudate.  Eyes: Conjunctivae are normal. Right eye exhibits no discharge. Left eye exhibits no discharge. No scleral icterus.    Neck: Normal range of motion. Neck supple. No JVD present. No tracheal deviation present. No thyromegaly present.  Cardiovascular: Normal rate, regular rhythm, normal heart sounds and intact distal pulses.  Exam reveals no gallop and no friction rub.   No murmur heard. Pulmonary/Chest: Effort normal and breath sounds normal. No stridor. No respiratory  distress. He has no wheezes. He has no rales. He exhibits no tenderness.  Abdominal: Soft. Bowel sounds are normal. He exhibits no distension. There is no tenderness. There is no rebound and no guarding.  Musculoskeletal: Normal range of motion. He exhibits no edema or tenderness.       Lumbar back: Normal. He exhibits normal range of motion, no tenderness, no bony tenderness, no swelling, no edema, no deformity, no laceration, no pain, no spasm and normal pulse.  Lymphadenopathy:    He has no cervical adenopathy.  Neurological: He has normal strength. He displays no atrophy, no tremor and normal reflexes. No cranial nerve deficit or sensory deficit. He exhibits normal muscle tone. He displays a negative Romberg sign. He displays no seizure activity. Coordination and gait normal.  Reflex Scores:      Tricep reflexes are 1+ on the right side and 1+ on the left side.      Bicep reflexes are 1+ on the right side and 1+ on the left side.      Brachioradialis reflexes are 1+ on the right side and 1+ on the left side.      Patellar reflexes are 1+ on the right side and 1+ on the left side.      Achilles reflexes are 1+ on the right side and 1+ on the left side. Neg SLR in BLE  Skin: Skin is warm and dry. No rash noted. He is not diaphoretic. No erythema. No pallor.  Vitals reviewed.   Lab Results  Component Value Date   WBC 4.3 08/23/2014   HGB 15.3 08/23/2014   HCT 44.9 08/23/2014   PLT 334.0 08/23/2014   GLUCOSE 102* 08/23/2014   CHOL 236* 08/23/2014   TRIG 170.0* 08/23/2014   HDL 49.40 08/23/2014   LDLDIRECT 119.3 07/11/2012   LDLCALC 153* 08/23/2014   ALT 25 08/23/2014   AST 20 08/23/2014   NA 138 08/23/2014   K 4.6 08/23/2014   CL 101 08/23/2014   CREATININE 0.98 08/23/2014   BUN 21 08/23/2014   CO2 30 08/23/2014   TSH 0.99 08/23/2014   PSA 0.24 08/23/2014   HGBA1C 5.3 07/13/2012    No results found.  Assessment & Plan:   Juan [name] was seen today for back pain.  Diagnoses  and all orders for this visit:  Need for hepatitis C screening test -     Hepatitis C Antibody; Future  Lesion of lower eyelid -     Ambulatory referral to Ophthalmology  Low back pain without sciatica, unspecified back pain laterality -     methocarbamol (ROBAXIN-750) 750 MG tablet; Take 1 tablet (750 mg total) by mouth 3 (three) times daily.  Herniation of lumbar intervertebral disc- he has no findings of radiculopathy but is in quite a bit of pain. Will treat with a Depo-Medrol injection and will start a muscle relaxer as well. -     methocarbamol (ROBAXIN-750)  750 MG tablet; Take 1 tablet (750 mg total) by mouth 3 (three) times daily. -     methylPREDNISolone acetate (DEPO-MEDROL) injection 120 mg; Inject 1.5 mLs (120 mg total) into the muscle once.   I have discontinued Juan [name]'s celecoxib. I am also having him start on methocarbamol. Additionally, I am having him maintain his Multiple Vitamin (MULTIVITAMIN PO), fish oil-omega-3 fatty acids, aspirin, UNABLE TO FIND, testosterone cypionate, tadalafil, losartan-hydrochlorothiazide, traZODone, BD DISP NEEDLES, B-D 3CC LUER-LOK SYR 23GX1", and atorvastatin. We administered methylPREDNISolone acetate.  Meds ordered this encounter  Medications  . methocarbamol (ROBAXIN-750) 750 MG tablet    Sig: Take 1 tablet (750 mg total) by mouth 3 (three) times daily.    Dispense:  90 tablet    Refill:  1  . methylPREDNISolone acetate (DEPO-MEDROL) injection 120 mg    Sig:      Follow-up: Return in about 2 months (around 02/04/2015).  Scarlette Calico, MD

## 2014-12-19 ENCOUNTER — Telehealth: Payer: Self-pay | Admitting: *Deleted

## 2014-12-19 DIAGNOSIS — M5126 Other intervertebral disc displacement, lumbar region: Secondary | ICD-10-CM

## 2014-12-19 MED ORDER — METHYLPREDNISOLONE 4 MG PO TBPK: ORAL_TABLET | ORAL | Status: DC

## 2014-12-19 NOTE — Telephone Encounter (Signed)
Called pt no answer LMOM rx sent to [facility].../lmb

## 2014-12-19 NOTE — Telephone Encounter (Signed)
done

## 2014-12-19 NOTE — Telephone Encounter (Signed)
Left msg on triage stating saw md couple weeks ago for back spasm. Pt states he is still having them wanting to see if md would rx prednisone dose pack like he done before..../LMB

## 2014-12-20 ENCOUNTER — Other Ambulatory Visit: Payer: Self-pay

## 2014-12-20 DIAGNOSIS — E291 Testicular hypofunction: Secondary | ICD-10-CM

## 2014-12-20 MED ORDER — TESTOSTERONE CYPIONATE 200 MG/ML IM SOLN: 200.0000 mg | INTRAMUSCULAR | Status: DC

## 2015-01-25 ENCOUNTER — Telehealth: Payer: Self-pay | Admitting: Internal Medicine

## 2015-01-25 ENCOUNTER — Other Ambulatory Visit: Payer: Self-pay | Admitting: Internal Medicine

## 2015-01-25 MED ORDER — MELOXICAM 15 MG PO TABS: 15.0000 mg | ORAL_TABLET | Freq: Every day | ORAL | Status: DC

## 2015-01-25 NOTE — Telephone Encounter (Signed)
Notified patient.

## 2015-01-25 NOTE — Telephone Encounter (Signed)
Patient is requesting Dr. Ronnald Ramp to send meloxicam to his pharmacy at [facility] in [place] when he gets back in the office for joint issues.

## 2015-01-25 NOTE — Telephone Encounter (Signed)
Done

## 2015-02-11 ENCOUNTER — Telehealth: Payer: Self-pay | Admitting: Internal Medicine

## 2015-02-11 NOTE — Telephone Encounter (Signed)
Pt called in would like a referral to get a  vasectomy .  Can the referral just be put in for does he need an appt?

## 2015-02-11 NOTE — Telephone Encounter (Signed)
I don't think he needs a referral for this

## 2015-02-12 NOTE — Telephone Encounter (Signed)
Notified pt with md response. Did give him Dr. Jeffie Pollock # w/[facility] who does procedure.../l;mb

## 2015-03-25 ENCOUNTER — Telehealth: Payer: Self-pay | Admitting: Internal Medicine

## 2015-03-25 ENCOUNTER — Other Ambulatory Visit: Payer: Self-pay | Admitting: Internal Medicine

## 2015-03-25 DIAGNOSIS — H029 Unspecified disorder of eyelid: Secondary | ICD-10-CM | POA: Insufficient documentation

## 2015-03-25 NOTE — Telephone Encounter (Signed)
Please advise 

## 2015-03-25 NOTE — Telephone Encounter (Signed)
Patient called to advise that he needs a referral to an optometrist to remove skin tag on the eye.   He does not have a provider in mind at this point

## 2015-03-25 NOTE — Telephone Encounter (Signed)
Referral ordered

## 2015-04-03 ENCOUNTER — Other Ambulatory Visit: Payer: Self-pay | Admitting: Optometry

## 2015-04-15 ENCOUNTER — Other Ambulatory Visit: Payer: Self-pay | Admitting: Internal Medicine

## 2015-08-09 ENCOUNTER — Telehealth: Payer: Self-pay | Admitting: *Deleted

## 2015-08-09 ENCOUNTER — Other Ambulatory Visit: Payer: Self-pay | Admitting: Internal Medicine

## 2015-08-09 DIAGNOSIS — E291 Testicular hypofunction: Secondary | ICD-10-CM

## 2015-08-09 MED ORDER — TESTOSTERONE CYPIONATE 200 MG/ML IM SOLN: 200.0000 mg | INTRAMUSCULAR | 1 refills | Status: DC

## 2015-08-09 NOTE — Telephone Encounter (Signed)
Rx written.

## 2015-08-09 NOTE — Telephone Encounter (Signed)
Faxed script to [facility].../lmb

## 2015-08-09 NOTE — Telephone Encounter (Signed)
Rec;d fax patient requesting refills on his testosterone...Juan [name]

## 2015-09-04 ENCOUNTER — Other Ambulatory Visit: Payer: Self-pay | Admitting: Internal Medicine

## 2015-09-04 DIAGNOSIS — E785 Hyperlipidemia, unspecified: Secondary | ICD-10-CM

## 2015-10-09 ENCOUNTER — Encounter: Payer: Self-pay | Admitting: Nurse Practitioner

## 2015-10-09 ENCOUNTER — Ambulatory Visit (INDEPENDENT_AMBULATORY_CARE_PROVIDER_SITE_OTHER): Payer: PRIVATE HEALTH INSURANCE | Admitting: Nurse Practitioner

## 2015-10-09 VITALS — BP 168/68 | HR 69 | Temp 98.0°F | Ht 72.0 in | Wt 177.0 lb

## 2015-10-09 DIAGNOSIS — Z23 Encounter for immunization: Secondary | ICD-10-CM | POA: Diagnosis not present

## 2015-10-09 DIAGNOSIS — M25561 Pain in right knee: Secondary | ICD-10-CM

## 2015-10-09 DIAGNOSIS — M25461 Effusion, right knee: Secondary | ICD-10-CM | POA: Diagnosis not present

## 2015-10-09 MED ORDER — PREDNISONE 10 MG (21) PO TBPK: 10.0000 mg | ORAL_TABLET | Freq: Every day | ORAL | 0 refills | Status: DC

## 2015-10-09 MED ORDER — TRAMADOL HCL 50 MG PO TABS: 50.0000 mg | ORAL_TABLET | Freq: Four times a day (QID) | ORAL | 0 refills | Status: DC | PRN

## 2015-10-09 NOTE — Progress Notes (Signed)
Subjective:  Patient ID: Juan [name], male    DOB: 1955-08-15  Age: 60 y.o. MRN: [id number]  CC: Knee Injury (Pt stated right knee injury while hiking and swollen/painful for 2 days.)  Knee Pain   The incident occurred 2 days ago. Incident location: after hiking for 18mile on and incline. denies any twisting injury. The injury mechanism is unknown. The pain is present in the right knee. The quality of the pain is described as aching and shooting. The pain is at a severity of 9/10. The pain is severe. The pain has been constant since onset. Associated symptoms include an inability to bear weight. Pertinent negatives include no loss of motion, loss of sensation, muscle weakness, numbness or tingling. He reports no foreign bodies present. The symptoms are aggravated by weight bearing. He has tried ice, non-weight bearing, NSAIDs and rest for the symptoms. The treatment provided no relief.    Outpatient Medications Prior to Visit  Medication Sig Dispense Refill  . aspirin 81 MG tablet Take 81 mg by mouth daily.      Marland Kitchen atorvastatin (LIPITOR) 20 MG tablet TAKE 1 TABLET BY MOUTH ONCE A DAY 90 tablet 0  . B-D 3CC LUER-LOK SYR 23GX1" 23G X 1" 3 ML MISC   0  . BD DISP NEEDLES 18G X 1-1/2" MISC   0  . fish oil-omega-3 fatty acids 1000 MG capsule Take 2 g by mouth daily.      Marland Kitchen losartan-hydrochlorothiazide (HYZAAR) 50-12.5 MG tablet TAKE 1 TABLET BY MOUTH ONCE A DAY 90 tablet 0  . meloxicam (MOBIC) 15 MG tablet Take 1 tablet (15 mg total) by mouth daily. 90 tablet 3  . methocarbamol (ROBAXIN-750) 750 MG tablet Take 1 tablet (750 mg total) by mouth 3 (three) times daily. 90 tablet 1  . Multiple Vitamin (MULTIVITAMIN PO) Take by mouth daily.      . tadalafil (CIALIS) 5 MG tablet Take 1 tablet (5 mg total) by mouth daily as needed for erectile dysfunction. 90 tablet 3  . testosterone cypionate (DEPOTESTOSTERONE CYPIONATE) 200 MG/ML injection Inject 1 mL (200 mg total) into the muscle every 14 (fourteen)  days. 10 mL 1  . traZODone (DESYREL) 50 MG tablet TAKE 1 TO 2 TABLETS BY MOUTH DAILY AT BEDTIME ASNEEDED 180 tablet 3  . UNABLE TO FIND Med Name: Tribulus Terrestris . Takes 2 capsules daily     No facility-administered medications prior to visit.     ROS See HPI  Objective:  BP (!) 168/68 (BP Location: Left Arm, Patient Position: Sitting, Cuff Size: Normal)   Pulse 69   Temp 98 F (36.7 C)   Ht 6' (1.829 m)   Wt 177 lb (80.3 kg)   SpO2 98%   BMI 24.01 kg/m   BP Readings from Last 3 Encounters:  10/09/15 (!) 168/68  12/05/14 128/84  08/23/14 118/78    Wt Readings from Last 3 Encounters:  10/09/15 177 lb (80.3 kg)  12/05/14 175 lb (79.4 kg)  08/23/14 177 lb (80.3 kg)    Physical Exam  Constitutional: He is oriented to person, place, and time. No distress.  Cardiovascular: Normal rate.   Pulmonary/Chest: Effort normal.  Musculoskeletal: He exhibits edema.       Right hip: Normal.       Right knee: He exhibits decreased range of motion, swelling, effusion and erythema. He exhibits no ecchymosis, no deformity, no laceration, no LCL laxity, normal patellar mobility, no bony tenderness and no MCL laxity. No medial joint  line, no lateral joint line and no patellar tendon tenderness noted.       Right ankle: Normal.  No patellar apprehension. Increased warmth and erythema of joint.  Neurological: He is alert and oriented to person, place, and time.  Ambulating with cane. limping  Vitals reviewed.   Lab Results  Component Value Date   WBC 4.3 08/23/2014   HGB 15.3 08/23/2014   HCT 44.9 08/23/2014   PLT 334.0 08/23/2014   GLUCOSE 102 (H) 08/23/2014   CHOL 236 (H) 08/23/2014   TRIG 170.0 (H) 08/23/2014   HDL 49.40 08/23/2014   LDLDIRECT 119.3 07/11/2012   LDLCALC 153 (H) 08/23/2014   ALT 25 08/23/2014   AST 20 08/23/2014   NA 138 08/23/2014   K 4.6 08/23/2014   CL 101 08/23/2014   CREATININE 0.98 08/23/2014   BUN 21 08/23/2014   CO2 30 08/23/2014   TSH 0.99  08/23/2014   PSA 0.24 08/23/2014   HGBA1C 5.3 07/13/2012    No results found.  Assessment & Plan:   Juan [name] was seen today for knee injury.  Diagnoses and all orders for this visit:  Pain and swelling of right knee -     Discontinue: predniSONE (STERAPRED UNI-PAK 21 TAB) 10 MG (21) TBPK tablet; Take 1 tablet (10 mg total) by mouth daily. -     traMADol (ULTRAM) 50 MG tablet; Take 1 tablet (50 mg total) by mouth every 6 (six) hours as needed. -     DG Knee 3 Views Right; Future -     predniSONE (STERAPRED UNI-PAK 21 TAB) 10 MG (21) TBPK tablet; Take 1 tablet (10 mg total) by mouth daily.  Need for prophylactic vaccination and inoculation against influenza -     Flu Vaccine QUAD 36+ mos IM   I am having Juan [name] start on traMADol. I am also having him maintain his Multiple Vitamin (MULTIVITAMIN PO), fish oil-omega-3 fatty acids, aspirin, UNABLE TO FIND, tadalafil, BD DISP NEEDLES, B-D 3CC LUER-LOK SYR 23GX1", methocarbamol, meloxicam, traZODone, testosterone cypionate, atorvastatin, losartan-hydrochlorothiazide, and predniSONE.  Meds ordered this encounter  Medications  . DISCONTD: predniSONE (STERAPRED UNI-PAK 21 TAB) 10 MG (21) TBPK tablet    Sig: Take 1 tablet (10 mg total) by mouth daily.    Dispense:  21 tablet    Refill:  0    Order Specific Question:   Supervising Provider    Answer:   Cassandria Anger [[id number]]  . traMADol (ULTRAM) 50 MG tablet    Sig: Take 1 tablet (50 mg total) by mouth every 6 (six) hours as needed.    Dispense:  20 tablet    Refill:  0    Order Specific Question:   Supervising Provider    Answer:   Cassandria Anger [[id number]]  . predniSONE (STERAPRED UNI-PAK 21 TAB) 10 MG (21) TBPK tablet    Sig: Take 1 tablet (10 mg total) by mouth daily.    Dispense:  21 tablet    Refill:  0    Order Specific Question:   Supervising Provider    Answer:   Cassandria Anger [[id number]]    Follow-up: Return if symptoms worsen or fail to improve.  Wilfred Lacy, NP

## 2015-10-09 NOTE — Patient Instructions (Addendum)
Continue knee elevation and cold compress. May use compression sleeve during day and off at night. Rest joint as much as possible.

## 2015-10-09 NOTE — Progress Notes (Signed)
Pre visit review using our clinic review tool, if applicable. No additional management support is needed unless otherwise documented below in the visit note. 

## 2015-10-10 ENCOUNTER — Telehealth: Payer: Self-pay | Admitting: Nurse Practitioner

## 2015-10-10 ENCOUNTER — Ambulatory Visit (INDEPENDENT_AMBULATORY_CARE_PROVIDER_SITE_OTHER)
Admission: RE | Admit: 2015-10-10 | Discharge: 2015-10-10 | Disposition: A | Discharge status: Home / Self Care | Payer: PRIVATE HEALTH INSURANCE | Source: Ambulatory Visit | Attending: Nurse Practitioner | Admitting: Nurse Practitioner

## 2015-10-10 ENCOUNTER — Other Ambulatory Visit: Payer: Self-pay | Admitting: Nurse Practitioner

## 2015-10-10 DIAGNOSIS — M25461 Effusion, right knee: Secondary | ICD-10-CM

## 2015-10-10 DIAGNOSIS — M25561 Pain in right knee: Secondary | ICD-10-CM | POA: Diagnosis not present

## 2015-10-10 NOTE — Telephone Encounter (Signed)
Routing to charlotte----please advise, thanks 

## 2015-10-10 NOTE — Telephone Encounter (Signed)
Per charlotte, patient needs to follow directions on package which includes tapering med---I am calling pharmacy and patient to advise

## 2015-10-10 NOTE — Telephone Encounter (Signed)
Patient called about the medication he was given yesterday. The prednisone, he states Juan [name] informed him to take it one way and the way on the package tells him something different. He wanted to call make sure which way he should be taking the medication. Please follow up with him. Thank you.

## 2015-10-10 NOTE — Telephone Encounter (Signed)
Pharmacy called about this rx too. [phone] He has not yet picked up the rx. If you will just follow up with them, they state they will inform him how to take the medication. Thank you.

## 2015-10-21 ENCOUNTER — Encounter: Payer: Self-pay | Admitting: Internal Medicine

## 2015-10-21 ENCOUNTER — Ambulatory Visit (INDEPENDENT_AMBULATORY_CARE_PROVIDER_SITE_OTHER): Payer: PRIVATE HEALTH INSURANCE | Admitting: Internal Medicine

## 2015-10-21 ENCOUNTER — Other Ambulatory Visit (INDEPENDENT_AMBULATORY_CARE_PROVIDER_SITE_OTHER): Payer: PRIVATE HEALTH INSURANCE

## 2015-10-21 VITALS — BP 130/78 | HR 68 | Temp 98.1°F | Resp 16 | Ht 72.0 in | Wt 175.5 lb

## 2015-10-21 DIAGNOSIS — Z0000 Encounter for general adult medical examination without abnormal findings: Secondary | ICD-10-CM

## 2015-10-21 DIAGNOSIS — E291 Testicular hypofunction: Secondary | ICD-10-CM

## 2015-10-21 DIAGNOSIS — I10 Essential (primary) hypertension: Secondary | ICD-10-CM | POA: Diagnosis not present

## 2015-10-21 LAB — CBC WITH DIFFERENTIAL/PLATELET
BASOS PCT: 0.3 % (ref 0.0–3.0)
Basophils Absolute: 0 10*3/uL (ref 0.0–0.1)
EOS ABS: 0.1 10*3/uL (ref 0.0–0.7)
Eosinophils Relative: 1.3 % (ref 0.0–5.0)
HEMATOCRIT: 44.6 % (ref 39.0–52.0)
Hemoglobin: 15 g/dL (ref 13.0–17.0)
LYMPHS ABS: 2.4 10*3/uL (ref 0.7–4.0)
LYMPHS PCT: 22.1 % (ref 12.0–46.0)
MCHC: 33.7 g/dL (ref 30.0–36.0)
MCV: 88.4 fl (ref 78.0–100.0)
Monocytes Absolute: 1.2 10*3/uL — ABNORMAL HIGH (ref 0.1–1.0)
Monocytes Relative: 11.2 % (ref 3.0–12.0)
NEUTROS ABS: 6.9 10*3/uL (ref 1.4–7.7)
NEUTROS PCT: 65.1 % (ref 43.0–77.0)
PLATELETS: 420 10*3/uL — AB (ref 150.0–400.0)
RBC: 5.04 Mil/uL (ref 4.22–5.81)
RDW: 14.1 % (ref 11.5–15.5)
WBC: 10.6 10*3/uL — ABNORMAL HIGH (ref 4.0–10.5)

## 2015-10-21 LAB — LIPID PANEL
CHOL/HDL RATIO: 3
Cholesterol: 176 mg/dL (ref 0–200)
HDL: 60.7 mg/dL (ref >39.00)
LDL CALC: 89 mg/dL (ref 0–99)
NONHDL: 115.04
Triglycerides: 129 mg/dL (ref 0.0–149.0)
VLDL: 25.8 mg/dL (ref 0.0–40.0)

## 2015-10-21 LAB — COMPREHENSIVE METABOLIC PANEL
ALT: 30 U/L (ref 0–53)
AST: 15 U/L (ref 0–37)
Albumin: 4.4 g/dL (ref 3.5–5.2)
Alkaline Phosphatase: 22 U/L — ABNORMAL LOW (ref 39–117)
BILIRUBIN TOTAL: 0.6 mg/dL (ref 0.2–1.2)
BUN: 20 mg/dL (ref 6–23)
CALCIUM: 9.3 mg/dL (ref 8.4–10.5)
CHLORIDE: 99 meq/L (ref 96–112)
CO2: 29 meq/L (ref 19–32)
CREATININE: 0.96 mg/dL (ref 0.40–1.50)
GFR: 84.78 mL/min (ref >60.00)
GLUCOSE: 89 mg/dL (ref 70–99)
Potassium: 3.8 mEq/L (ref 3.5–5.1)
SODIUM: 137 meq/L (ref 135–145)
Total Protein: 6.7 g/dL (ref 6.0–8.3)

## 2015-10-21 LAB — PSA: PSA: 0.25 ng/mL (ref 0.10–4.00)

## 2015-10-21 LAB — TSH: TSH: 1.21 u[IU]/mL (ref 0.35–4.50)

## 2015-10-21 MED ORDER — ZOSTER VACCINE LIVE 19400 UNT/0.65ML ~~LOC~~ SUSR: 0.6500 mL | Freq: Once | SUBCUTANEOUS | 0 refills | Status: AC

## 2015-10-21 NOTE — Progress Notes (Signed)
[name] [address] [place], [place] [address] Phone: [phone] Subjective:    I'm seeing this patient by the request  of:  Scarlette Calico, MD  CC: Right knee pain  [id number]  Juan [name] is a 60 y.o. male coming in with complaint of right knee pain. Patient states this started when she was on a hike patient does not remember any injury but unfortunately developed this shooting stabbing pain and was followed by an aching pain. States the severity as 9 out of 10. Patient states that she was unable to bear weight initially. Taking anti-inflammatories and rest with very mild relief. 2 weeks ago.. Provider and was given tramadol as well as prednisone. X-rays were ordered. X-rays were independently visualized by me. Patient was found to have a joint effusion as well as narrowing of the medial joint space.      Past Medical History:  Diagnosis Date  . Hypertension   . Personal history of colonic polyps - adenoma 04/24/2008   04/2008 - diminutive adenoma 06/13/2013     Past Surgical History:  Procedure Laterality Date  . APPENDECTOMY  2007   ruptured  . COLONOSCOPY     Social History   Social History  . Marital status: Married    Spouse name: N/A  . Number of children: N/A  . Years of education: N/A   Social History Main Topics  . Smoking status: Former Smoker    Quit date: 01/06/1999  . Smokeless tobacco: Never Used  . Alcohol use No  . 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 Sexual activity: Yes   Other Topics Concern  . Not on file   Social History Narrative   Regular exercise-Yes   He did rehab 12 years ago and has screened several times for HIV and Hep A/B/C and always negative.   Allergies  Allergen Reactions  . Ace Inhibitors     REACTION: cough   Family History  Problem Relation Age of Onset  . Colon cancer Neg Hx   . Prostate cancer Father   . Heart disease Father      Past medical history, social, surgical and family history all reviewed in electronic medical record.  No pertanent information unless stated regarding to the chief complaint.   Review of Systems: No headache, visual changes, nausea, vomiting, diarrhea, constipation, dizziness, abdominal pain, skin rash, fevers, chills, night sweats, weight loss, swollen lymph nodes, body aches, joint swelling, muscle aches, chest pain, shortness of breath, mood changes.   Objective  There were no vitals taken for this visit.  General: No apparent distress alert and oriented x3 mood and affect normal, dressed appropriately.  HEENT: Pupils equal, extraocular movements intact  Respiratory: Patient's speak in full sentences and does not appear short of breath  Cardiovascular: No lower extremity edema, non tender, no erythema  Skin: Warm dry intact with no signs of infection or rash on extremities or on axial skeleton.  Abdomen: Soft nontender  Neuro: Cranial nerves II through XII are intact, neurovascularly intact in all extremities with 2+ DTRs and 2+ pulses.  Lymph: No lymphadenopathy of posterior or anterior cervical chain or axillae bilaterally.  Gait normal with good balance and coordination.  MSK:  Non tender with full range of motion and good stability and symmetric strength and tone of shoulders, elbows, wrist, hip and ankles bilaterally.  Knee:Right Mild hypertrophia of the medial joint  line Tender to patient over the medial joint line. ROM full in flexion and extension and lower leg rotation. Mild instability with valgus force Positive Mcmurray's, Apley's, and Thessalonian tests. Non painful patellar compression. Patellar glide with mild crepitus. Patellar and quadriceps tendons unremarkable. Hamstring and quadriceps strength is normal.  Contralateral knee unremarkable  After informed written and verbal consent, patient was seated on exam table. Right knee was prepped with alcohol swab and  utilizing anterolateral approach, patient's right knee space was injected with 4:1  marcaine 0.5%: Kenalog 40mg /dL. Patient tolerated the procedure well without immediate complications.  Procedure note [id number]; 15 minutes spent for Therapeutic exercises as stated in above notes.  This included exercises focusing on stretching, strengthening, with significant focus on eccentric aspects.  Flexion and extension exercises working on hip abductor strengthening as well as vastus medialis oblique. Proper technique shown and discussed handout in great detail with ATC.  All questions were discussed and answered.       Impression and Recommendations:     This case required medical decision making of moderate complexity.      Note: This dictation was prepared with Dragon dictation along with smaller phrase technology. Any transcriptional errors that result from this process are unintentional.

## 2015-10-21 NOTE — Patient Instructions (Signed)

## 2015-10-21 NOTE — Progress Notes (Signed)
Subjective:  Patient ID: Juan [name], male    DOB: [birth year]  Age: 60 y.o. MRN: [id number]  CC: Hypertension; Hyperlipidemia; and Annual Exam   HPI [name] presents for a CPX.   He tells me his blood pressure has been well controlled. He has had no recent episodes of headache/blurred vision/chest pain/shortness of breath/fatigue/or edema.  He is tolerating his cholesterol medicine well with no muscle or joint aches.  He is seeing sports medicine about pain and swelling in his right knee.  Outpatient Medications Prior to Visit  Medication Sig Dispense Refill  . aspirin 81 MG tablet Take 81 mg by mouth daily.      Marland Kitchen atorvastatin (LIPITOR) 20 MG tablet TAKE 1 TABLET BY MOUTH ONCE A DAY 90 tablet 0  . B-D 3CC LUER-LOK SYR 23GX1" 23G X 1" 3 ML MISC   0  . BD DISP NEEDLES 18G X 1-1/2" MISC   0  . fish oil-omega-3 fatty acids 1000 MG capsule Take 2 g by mouth daily.      Marland Kitchen losartan-hydrochlorothiazide (HYZAAR) 50-12.5 MG tablet TAKE 1 TABLET BY MOUTH ONCE A DAY 90 tablet 0  . Multiple Vitamin (MULTIVITAMIN PO) Take by mouth daily.      Marland Kitchen testosterone cypionate (DEPOTESTOSTERONE CYPIONATE) 200 MG/ML injection Inject 1 mL (200 mg total) into the muscle every 14 (fourteen) days. 10 mL 1  . traZODone (DESYREL) 50 MG tablet TAKE 1 TO 2 TABLETS BY MOUTH DAILY AT BEDTIME ASNEEDED 180 tablet 3  . UNABLE TO FIND Med Name: Tribulus Terrestris . Takes 2 capsules daily    . meloxicam (MOBIC) 15 MG tablet Take 1 tablet (15 mg total) by mouth daily. 90 tablet 3  . methocarbamol (ROBAXIN-750) 750 MG tablet Take 1 tablet (750 mg total) by mouth 3 (three) times daily. 90 tablet 1  . tadalafil (CIALIS) 5 MG tablet Take 1 tablet (5 mg total) by mouth daily as needed for erectile dysfunction. 90 tablet 3  . traMADol (ULTRAM) 50 MG tablet Take 1 tablet (50 mg total) by mouth every 6 (six) hours as needed. 20 tablet 0  . predniSONE (STERAPRED UNI-PAK 21 TAB) 10 MG (21) TBPK tablet Take 1 tablet (10 mg  total) by mouth daily. 21 tablet 0   No facility-administered medications prior to visit.     ROS Review of Systems  Constitutional: Negative.  Negative for appetite change, diaphoresis, fatigue and fever.  HENT: Negative.   Eyes: Negative.  Negative for visual disturbance.  Respiratory: Negative.  Negative for cough, choking, chest tightness, shortness of breath and stridor.   Cardiovascular: Negative.  Negative for chest pain, palpitations and leg swelling.  Gastrointestinal: Negative.  Negative for abdominal pain, blood in stool, constipation, diarrhea and nausea.  Endocrine: Negative.   Genitourinary: Negative.  Negative for difficulty urinating, dysuria, penile swelling, scrotal swelling and testicular pain.  Musculoskeletal: Positive for arthralgias. Negative for gait problem, joint swelling, myalgias and neck pain.  Skin: Negative.  Negative for color change and rash.  Allergic/Immunologic: Negative.   Neurological: Negative.  Negative for dizziness.  Hematological: Negative for adenopathy. Does not bruise/bleed easily.  Psychiatric/Behavioral: Negative.  Negative for agitation, behavioral problems, dysphoric mood and sleep disturbance. The patient is not nervous/anxious.     Objective:  BP 130/78 (BP Location: Left Arm, Patient Position: Sitting, Cuff Size: Normal)   Pulse 68   Temp 98.1 F (36.7 C) (Oral)   Resp 16   Ht 6' (1.829 m)   Wt 175 lb  8 oz (79.6 kg)   SpO2 97%   BMI 23.80 kg/m   BP Readings from Last 3 Encounters:  10/22/15 120/82  10/21/15 130/78  10/09/15 (!) 168/68    Wt Readings from Last 3 Encounters:  10/22/15 176 lb (79.8 kg)  10/21/15 175 lb 8 oz (79.6 kg)  10/09/15 177 lb (80.3 kg)    Physical Exam  Constitutional: No distress.  HENT:  Mouth/Throat: Oropharynx is clear and moist. No oropharyngeal exudate.  Eyes: Conjunctivae are normal. Right eye exhibits no discharge. Left eye exhibits no discharge. No scleral icterus.  Neck: Normal  range of motion. Neck supple. No JVD present. No tracheal deviation present. No thyromegaly present.  Cardiovascular: Normal rate, regular rhythm, normal heart sounds and intact distal pulses.  Exam reveals no gallop and no friction rub.   No murmur heard. Pulmonary/Chest: Effort normal and breath sounds normal. No stridor. No respiratory distress. He has no wheezes. He has no rales. He exhibits no tenderness.  Abdominal: Soft. Bowel sounds are normal. He exhibits no distension and no mass. There is no tenderness. There is no rebound and no guarding. Hernia confirmed negative in the right inguinal area and confirmed negative in the left inguinal area.  Genitourinary: Prostate normal, testes normal and penis normal. Rectal exam shows no external hemorrhoid, no internal hemorrhoid, no fissure, no mass, no tenderness, anal tone normal and guaiac negative stool. Prostate is not enlarged and not tender. Right testis shows no mass, no swelling and no tenderness. Right testis is descended. Left testis shows no mass, no swelling and no tenderness. Left testis is descended. Circumcised. No penile erythema or penile tenderness. No discharge found.  Lymphadenopathy:    He has no cervical adenopathy.       Right: No inguinal adenopathy present.       Left: No inguinal adenopathy present.  Skin: He is not diaphoretic.  Vitals reviewed.   Lab Results  Component Value Date   WBC 10.6 (H) 10/21/2015   HGB 15.0 10/21/2015   HCT 44.6 10/21/2015   PLT 420.0 (H) 10/21/2015   GLUCOSE 89 10/21/2015   CHOL 176 10/21/2015   TRIG 129.0 10/21/2015   HDL 60.70 10/21/2015   LDLDIRECT 119.3 07/11/2012   LDLCALC 89 10/21/2015   ALT 30 10/21/2015   AST 15 10/21/2015   NA 137 10/21/2015   K 3.8 10/21/2015   CL 99 10/21/2015   CREATININE 0.96 10/21/2015   BUN 20 10/21/2015   CO2 29 10/21/2015   TSH 1.21 10/21/2015   PSA 0.25 10/21/2015   HGBA1C 5.3 07/13/2012    Dg Knee 4 Views W/patella Right  Result Date:  10/10/2015 CLINICAL DATA:  Pain and swelling after hiking EXAM: RIGHT KNEE - COMPLETE 4+ VIEW COMPARISON:  None. FINDINGS: Weightbearing frontal, weight-bearing tunnel, weight-bearing lateral, and sunrise patellar images were obtained. There is no fracture or dislocation. There is a focal joint effusion. There is mild joint space narrowing medially. Other joint spaces appear unremarkable. No erosive change. IMPRESSION: Joint space narrowing medially. Joint effusion present. No acute fracture or dislocation. Electronically Signed   By: Lowella Grip III M.D.   On: 10/10/2015 14:16    Assessment & Plan:   Dayvid was seen today for hypertension, hyperlipidemia and annual exam.  Diagnoses and all orders for this visit:  Essential hypertension- His blood pressure is well controlled, lites and renal function are stable.  Routine general medical examination at a health care facility- exam completed, labs ordered and reviewed, vaccines  reviewed and updated, his colonoscopy is up-to-date, patient education material was given. -     Hepatitis C antibody; Future -     HIV antibody; Future -     Lipid panel; Future -     Comprehensive metabolic panel; Future -     CBC with Differential/Platelet; Future -     PSA; Future -     TSH; Future -     Zoster Vaccine Live, PF, (ZOSTAVAX) 09811 UNT/0.65ML injection; Inject 19,400 Units into the skin once.  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 -     Testosterone Total,Free,Bio, Males; Future   I have discontinued Mr. Tobey's predniSONE. I am also having him start on Zoster Vaccine Live (PF). Additionally, I am having him maintain his Multiple Vitamin (MULTIVITAMIN PO), fish oil-omega-3 fatty acids, aspirin, UNABLE TO FIND, tadalafil, BD DISP NEEDLES, B-D 3CC LUER-LOK SYR 23GX1", methocarbamol, meloxicam, traZODone,  testosterone cypionate, atorvastatin, losartan-hydrochlorothiazide, and traMADol.  Meds ordered this encounter  Medications  . Zoster Vaccine Live, PF, (ZOSTAVAX) [id number] UNT/0.65ML injection    Sig: Inject 19,400 Units into the skin once.    Dispense:  1 each    Refill:  0     Follow-up: Return in about 6 months (around 04/20/2016).  Scarlette Calico, MD

## 2015-10-21 NOTE — Progress Notes (Signed)
Pre visit review using our clinic review tool, if applicable. No additional management support is needed unless otherwise documented below in the visit note. 

## 2015-10-22 ENCOUNTER — Encounter: Payer: Self-pay | Admitting: Family Medicine

## 2015-10-22 ENCOUNTER — Encounter: Payer: Self-pay | Admitting: Internal Medicine

## 2015-10-22 ENCOUNTER — Ambulatory Visit (INDEPENDENT_AMBULATORY_CARE_PROVIDER_SITE_OTHER): Payer: PRIVATE HEALTH INSURANCE | Admitting: Family Medicine

## 2015-10-22 DIAGNOSIS — M1711 Unilateral primary osteoarthritis, right knee: Secondary | ICD-10-CM | POA: Insufficient documentation

## 2015-10-22 LAB — TESTOSTERONE TOTAL,FREE,BIO, MALES
ALBUMIN: 4.1 g/dL (ref 3.6–5.1)
Sex Hormone Binding: 39 nmol/L (ref 22–77)
TESTOSTERONE BIOAVAILABLE: 410.5 ng/dL (ref 110.0–575.0)
Testosterone, Free: 218 pg/mL (ref 46.0–224.0)
Testosterone: 1379 ng/dL — ABNORMAL HIGH (ref 250–827)

## 2015-10-22 LAB — HEPATITIS C ANTIBODY: HCV AB: NEGATIVE

## 2015-10-22 LAB — HIV ANTIBODY (ROUTINE TESTING W REFLEX): HIV 1&2 Ab, 4th Generation: NONREACTIVE

## 2015-10-22 MED ORDER — DICLOFENAC SODIUM 2 % TD SOLN: 2.0000 "application " | Freq: Two times a day (BID) | TRANSDERMAL | 3 refills | Status: DC

## 2015-10-22 NOTE — Patient Instructions (Addendum)
Good to see you.  Ice 20 minutes 2 times daily. Usually after activity and before bed. Exercises 3 times a week.  pennsaid pinkie amount topically 2 times daily as needed.  Vitamin D 2000 IUdaily  Turmeric 500mg  twice daily  Tart cherry extract any dose at night Avoid twisting motions with the knee Ok to do Yoga 3 times a week.  See me again in 4 weeks.

## 2015-10-22 NOTE — Assessment & Plan Note (Signed)
Patient given injection today. Tolerated the procedure well. We discussed icing regimen and home exercises. Discussed which activities to do an which was potentially avoid. Patient continued to be active. Patient given topical anti-inflammatories. We discussed over-the-counter medications as well. Work with Product/process development scientist to learn home exercises. Follow-up again in 4 weeks. Worsening symptoms consider formal physical therapy and/or viscous supple mentation.

## 2015-10-24 ENCOUNTER — Telehealth: Payer: Self-pay | Admitting: Internal Medicine

## 2015-10-24 NOTE — Telephone Encounter (Signed)
Pt called in saying he believes he know why his Testosterone was high. He thinks he got injections to close together. He just wanted to make sure you was aware. And what his next step should be. Thank you.

## 2015-10-25 NOTE — Telephone Encounter (Signed)
How frequently does he do the injections?

## 2015-10-25 NOTE — Telephone Encounter (Signed)
Please advise at your convenience 

## 2015-10-28 NOTE — Telephone Encounter (Signed)
He should keep a record of his testosterone injections and make sure that they're spread out every 2-3 weeks

## 2015-10-28 NOTE — Telephone Encounter (Signed)
Contacted pt: pt stated that his last injection was a week before the blood work which was about 5 days sooner than he should have. Pt was going off memory and stopped writing down the exact days. Pt offered to come in for a follow up if needed. Please advise.

## 2015-10-29 NOTE — Telephone Encounter (Signed)
Pt advised of MD responsed

## 2015-11-19 NOTE — Progress Notes (Signed)
[name] [address] [place], [place] [address] Phone: [phone] Subjective:    I'm seeing this patient by the request  of:  Juan Calico, MD  CC: Right knee pain  [id number]  Juan [name] is a 60 y.o. male coming in with complaint of right knee pain.  Patient was found to have a joint effusion as well as narrowing of the medial joint space. Patient was also found to have a meniscal tear. Patient was to do conservative therapy. Was given an injection. States that he is doing 85% better. No locking or giving out on him. Has been doing yoga which some mild discomfort at the end. Mild discomfort when he is on his feet a lot. States though that overall seems to be doing better and has not had any swelling again.     Past Medical History:  Diagnosis Date  . Hypertension   . Personal history of colonic polyps - adenoma 04/24/2008   04/2008 - diminutive adenoma 06/13/2013     Past Surgical History:  Procedure Laterality Date  . APPENDECTOMY  2007   ruptured  . COLONOSCOPY     Social History   Social History  . Marital status: Married    Spouse name: N/A  . Number of children: N/A  . Years of education: N/A   Social History Main Topics  . Smoking status: Former Smoker    Quit date: 01/06/1999  . Smokeless tobacco: Never Used  . Alcohol use No  . 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 Sexual activity: Yes   Other Topics Concern  . Not on file   Social History Narrative   Regular exercise-Yes   He did rehab 12 years ago and has screened several times for HIV and Hep A/B/C and always negative.   Allergies  Allergen Reactions  . Ace Inhibitors     REACTION: cough   Family History  Problem Relation Age of Onset  . Colon cancer Neg Hx   . Prostate cancer Father   . Heart disease Father     Past medical history, social, surgical and family history all reviewed in electronic medical  record.  No pertanent information unless stated regarding to the chief complaint.   Review of Systems: No headache, visual changes, nausea, vomiting, diarrhea, constipation, dizziness, abdominal pain, skin rash, fevers, chills, night sweats, weight loss, swollen lymph nodes, body aches, joint swelling, muscle aches, chest pain, shortness of breath, mood changes.   Objective  There were no vitals taken for this visit.  Systems examined below as of 11/19/15 General: NAD A&O x3 mood, affect normal  HEENT: Pupils equal, extraocular movements intact no nystagmus Respiratory: not short of breath at rest or with speaking Cardiovascular: No lower extremity edema, non tender Skin: Warm dry intact with no signs of infection or rash on extremities or on axial skeleton. Abdomen: Soft nontender, no masses Neuro: Cranial nerves  intact, neurovascularly intact in all extremities with 2+ DTRs and 2+ pulses. Lymph: No lymphadenopathy appreciated today  Gait normal with good balance and coordination.  MSK: Non tender with full range of motion and good stability and symmetric strength and tone of shoulders, elbows, wrist,  hips and ankles bilaterally.   Knee:Right Mild improvement in strength of the VMO compared to previous exam Tender to patient over the medial joint line still present but less. ROM full in flexion and extension and  lower leg rotation. Mild instability with valgus force Positive Mcmurray's, Apley's, and Thessalonian tests still present but less severe. Non painful patellar compression. Patellar glide with mild crepitus. Patellar and quadriceps tendons unremarkable. Hamstring and quadriceps strength is normal.  Contralateral knee unremarkable    .       Impression and Recommendations:     This case required medical decision making of moderate complexity.      Note: This dictation was prepared with Dragon dictation along with smaller phrase technology. Any transcriptional  errors that result from this process are unintentional.

## 2015-11-20 ENCOUNTER — Encounter: Payer: Self-pay | Admitting: Family Medicine

## 2015-11-20 ENCOUNTER — Ambulatory Visit (INDEPENDENT_AMBULATORY_CARE_PROVIDER_SITE_OTHER): Payer: PRIVATE HEALTH INSURANCE | Admitting: Family Medicine

## 2015-11-20 DIAGNOSIS — M1711 Unilateral primary osteoarthritis, right knee: Secondary | ICD-10-CM | POA: Diagnosis not present

## 2015-11-20 NOTE — Assessment & Plan Note (Signed)
Seems to be responding to conservative therapy. We discussed if worsening symptoms he would be a candidate for a medial unloader brace as well as potential viscous supplementation. Patient will continue with conservative therapy and see me again in 2 months. At that time if worsening symptoms also consider steroid again.

## 2015-11-20 NOTE — Patient Instructions (Signed)
Good to see you  Ice is your friend  Keep up with the exercises  Try to make the little changes in the gym we discussed such as slower range of motion and reps.  See me again in 8 weeks to make sure doing well and call me sooner if needed.

## 2015-12-10 ENCOUNTER — Other Ambulatory Visit: Payer: Self-pay | Admitting: Internal Medicine

## 2015-12-10 DIAGNOSIS — E785 Hyperlipidemia, unspecified: Secondary | ICD-10-CM

## 2016-01-13 ENCOUNTER — Ambulatory Visit: Payer: PRIVATE HEALTH INSURANCE | Admitting: Family Medicine

## 2016-02-05 ENCOUNTER — Other Ambulatory Visit: Payer: Self-pay | Admitting: Internal Medicine

## 2016-02-05 ENCOUNTER — Telehealth: Payer: Self-pay | Admitting: Internal Medicine

## 2016-02-05 DIAGNOSIS — M5126 Other intervertebral disc displacement, lumbar region: Secondary | ICD-10-CM

## 2016-02-05 DIAGNOSIS — G8929 Other chronic pain: Secondary | ICD-10-CM

## 2016-02-05 DIAGNOSIS — M545 Low back pain: Principal | ICD-10-CM

## 2016-02-05 MED ORDER — METHOCARBAMOL 750 MG PO TABS: 750.0000 mg | ORAL_TABLET | Freq: Three times a day (TID) | ORAL | 2 refills | Status: DC

## 2016-02-05 NOTE — Telephone Encounter (Signed)
Pt called in and said that he is having back spasms and would like to know if Dr Ronnald Ramp could just call in meds for him without coming in?

## 2016-02-05 NOTE — Telephone Encounter (Signed)
RX sent to [facility]

## 2016-02-05 NOTE — Telephone Encounter (Signed)
Pt informed rx was sent to [facility].

## 2016-02-05 NOTE — Telephone Encounter (Signed)
Patient is requesting script for prednisone.  Please follow up in regard.

## 2016-02-06 ENCOUNTER — Other Ambulatory Visit: Payer: Self-pay | Admitting: Internal Medicine

## 2016-02-06 DIAGNOSIS — M5126 Other intervertebral disc displacement, lumbar region: Secondary | ICD-10-CM

## 2016-02-06 MED ORDER — METHYLPREDNISOLONE 4 MG PO TBPK: ORAL_TABLET | ORAL | 0 refills | Status: DC

## 2016-02-06 NOTE — Telephone Encounter (Signed)
done

## 2016-02-07 NOTE — Telephone Encounter (Signed)
Pt informed rx has been sent. Pt stated that he picked up yesterday and is feeling much better.

## 2016-03-30 ENCOUNTER — Other Ambulatory Visit: Payer: Self-pay | Admitting: Internal Medicine

## 2016-06-03 ENCOUNTER — Other Ambulatory Visit: Payer: Self-pay | Admitting: Internal Medicine

## 2016-06-29 ENCOUNTER — Other Ambulatory Visit: Payer: Self-pay | Admitting: Internal Medicine

## 2016-06-29 DIAGNOSIS — E291 Testicular hypofunction: Secondary | ICD-10-CM

## 2016-07-21 NOTE — Progress Notes (Signed)
[name] [place], [place] [address] Phone: [phone] Subjective:    I'm seeing this patient by the request  of:  Juan Lima, MD   CC: knee pain   [id number]  Juan [name] is a 61 y.o. male coming in with complaint of bilateral knee pain. ppatient was last seen back inNovember. Was doing well after injections. Patient has been doing conservative therapy including over-the-counter medications and icing regimen. Patient states Both knees are giving him trouble. Continues to try to active. That she is hiking a significant amount.     Past Medical History:  Diagnosis Date  . Hypertension   . Personal history of colonic polyps - adenoma 04/24/2008   04/2008 - diminutive adenoma 06/13/2013     Past Surgical History:  Procedure Laterality Date  . APPENDECTOMY  2007   ruptured  . COLONOSCOPY     Social History   Social History  . Marital status: Married    Spouse name: N/A  . Number of children: N/A  . Years of education: N/A   Social History Main Topics  . Smoking status: Former Smoker    Quit date: 01/06/1999  . Smokeless tobacco: Never Used  . Alcohol use No  . 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 Sexual activity: Yes   Other Topics Concern  . Not on file   Social History Narrative   Regular exercise-Yes   He did rehab 12 years ago and has screened several times for HIV and Hep A/B/C and always negative.   Allergies  Allergen Reactions  . Ace Inhibitors     REACTION: cough   Family History  Problem Relation Age of Onset  . Colon cancer Neg Hx   . Prostate cancer Father   . Heart disease Father     Past medical history, social, surgical and family history all reviewed in electronic medical record.  No pertanent information unless stated regarding to the chief complaint.   Review of Systems:Review of systems updated and as accurate as of 07/22/16  No  headache, visual changes, nausea, vomiting, diarrhea, constipation, dizziness, abdominal pain, skin rash, fevers, chills, night sweats, weight loss, swollen lymph nodes, body aches, chest pain, shortness of breath, mood changes. Positive muscle aches and joint swelling  Objective  Weight 173 lb (78.5 kg). Systems examined below as of 07/22/16   General: No apparent distress alert and oriented x3 mood and affect normal, dressed appropriately.  HEENT: Pupils equal, extraocular movements intact  Respiratory: Patient's speak in full sentences and does not appear short of breath  Cardiovascular: No lower extremity edema, non tender, no erythema  Skin: Warm dry intact with no signs of infection or rash on extremities or on axial skeleton.  Abdomen: Soft nontender  Neuro: Cranial nerves II through XII are intact, neurovascularly intact in all extremities with 2+ DTRs and 2+ pulses.  Lymph: No lymphadenopathy of posterior or anterior cervical chain or axillae bilaterally.  Gait normal with good balance and coordination.  MSK:  Non tender with full range of motion and good stability and symmetric strength and tone of shoulders, elbows, wrist, hip, and ankles bilaterally.  Knee: Bilateral valgus deformity noted.  Tender to palpation over medial and PF joint line.  ROM full in flexion and extension and lower leg rotation. instability with valgus force.  painful patellar compression. Patellar glide with moderate crepitus. Patellar and quadriceps  tendons unremarkable. Hamstring and quadriceps strength is normal.   After informed written and verbal consent, patient was seated on exam table. Right knee was prepped with alcohol swab and utilizing anterolateral approach, patient's right knee space was injected with 4:1  marcaine 0.5%: Kenalog 40mg /dL. Patient tolerated the procedure well without immediate complications.  After informed written and verbal consent, patient was seated on exam table. Left  knee was prepped with alcohol swab and utilizing anterolateral approach, patient's left knee space was injected with 4:1  marcaine 0.5%: Kenalog 40mg /dL. Patient tolerated the procedure well without immediate complications.   Impression and Recommendations:     This case required medical decision making of moderate complexity.      Note: This dictation was prepared with Dragon dictation along with smaller phrase technology. Any transcriptional errors that result from this process are unintentional.

## 2016-07-22 ENCOUNTER — Ambulatory Visit (INDEPENDENT_AMBULATORY_CARE_PROVIDER_SITE_OTHER): Payer: Self-pay | Admitting: Family Medicine

## 2016-07-22 ENCOUNTER — Encounter: Payer: Self-pay | Admitting: Family Medicine

## 2016-07-22 DIAGNOSIS — M170 Bilateral primary osteoarthritis of knee: Secondary | ICD-10-CM

## 2016-07-22 NOTE — Assessment & Plan Note (Signed)
Bilateral injections given today. Discussed icing regimen and home exercises, discussed objective is doing which ones to avoid. Patient will increase activity as tolerated. Patient given trial of topical anti-inflammatories. His lungs patient does well we'll have patient follow-up again with me in 4 weeks. Possible consideration for viscous supplementation

## 2016-07-22 NOTE — Patient Instructions (Signed)
Great to see you  Ice 20 minutes 2 times daily. Usually after activity and before bed. Exercises 3 times a week.  pennsaid pinkie amount topically 2 times daily as needed.  Vitamin D 2000 IU dialy  Turmeric 500mg  1-2 times daily  [email or web address] size medium knee compression with hiking.  See me when you need me!

## 2016-08-04 ENCOUNTER — Other Ambulatory Visit: Payer: Self-pay | Admitting: Internal Medicine

## 2016-10-05 ENCOUNTER — Telehealth: Payer: Self-pay | Admitting: Emergency Medicine

## 2016-10-05 NOTE — Telephone Encounter (Signed)
Pt called and stated he is having knee pain. He is due for another injection Oct 18th he stated. He is wondering if you can give him a call about this. Thanks.

## 2016-10-05 NOTE — Telephone Encounter (Signed)
Spoke to pt, scheduled him for 10.12.18 for injection.

## 2016-10-16 ENCOUNTER — Ambulatory Visit (INDEPENDENT_AMBULATORY_CARE_PROVIDER_SITE_OTHER): Payer: Self-pay | Admitting: Family Medicine

## 2016-10-16 ENCOUNTER — Ambulatory Visit: Payer: Self-pay

## 2016-10-16 ENCOUNTER — Encounter: Payer: Self-pay | Admitting: Family Medicine

## 2016-10-16 VITALS — BP 130/80 | HR 60 | Ht 72.0 in | Wt 177.0 lb

## 2016-10-16 DIAGNOSIS — M25561 Pain in right knee: Secondary | ICD-10-CM

## 2016-10-16 DIAGNOSIS — M25562 Pain in left knee: Secondary | ICD-10-CM

## 2016-10-16 DIAGNOSIS — G8929 Other chronic pain: Secondary | ICD-10-CM

## 2016-10-16 DIAGNOSIS — M1711 Unilateral primary osteoarthritis, right knee: Secondary | ICD-10-CM

## 2016-10-16 MED ORDER — ZOSTER VAC RECOMB ADJUVANTED 50 MCG/0.5ML IM SUSR: 0.5000 mL | Freq: Once | INTRAMUSCULAR | 0 refills | Status: AC

## 2016-10-16 NOTE — Patient Instructions (Signed)
Good to see you  Drained the knee again.  Once you have insurance call and lets d othe other injections and we can consider MRI Otherwise see me when you need me.

## 2016-10-16 NOTE — Progress Notes (Signed)
[name] [place], [place] [address] Phone: [phone] Subjective:    I'm seeing this patient by the request  of:    CC:  Knee arthritis follow-up  [id number]  Juan [name] is a 61 y.o. male coming in with complaint of knee pain. Known to have arthritic knees bilaterally. Has responded fairly well to injections. Last injection was 3 months ago. Patient states Starting have swelling again in the right knee. Describes the pain as a dull, throbbing aching pain. Patient states that the swelling seems to be getting worse as well. Patient is been doing the exercises very intermittently.      Past Medical History:  Diagnosis Date  . Hypertension   . Personal history of colonic polyps - adenoma 04/24/2008   04/2008 - diminutive adenoma 06/13/2013     Past Surgical History:  Procedure Laterality Date  . APPENDECTOMY  2007   ruptured  . COLONOSCOPY     Social History   Social History  . Marital status: Married    Spouse name: N/A  . Number of children: N/A  . Years of education: N/A   Social History Main Topics  . Smoking status: Former Smoker    Quit date: 01/06/1999  . Smokeless tobacco: Never Used  . Alcohol use No  . 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 Sexual activity: Yes   Other Topics Concern  . None   Social History Narrative   Regular exercise-Yes   He did rehab 12 years ago and has screened several times for HIV and Hep A/B/C and always negative.   Allergies  Allergen Reactions  . Ace Inhibitors     REACTION: cough   Family History  Problem Relation Age of Onset  . Colon cancer Neg Hx   . Prostate cancer Father   . Heart disease Father      Past medical history, social, surgical and family history all reviewed in electronic medical record.  No pertanent information unless stated regarding to the chief complaint.   Review of Systems:Review of systems updated  and as accurate as of 10/16/16  No headache, visual changes, nausea, vomiting, diarrhea, constipation, dizziness, abdominal pain, skin rash, fevers, chills, night sweats, weight loss, swollen lymph nodes, body aches,chest pain, shortness of breath, mood changes. Positive muscle aches positive joint swelling  Objective  Blood pressure 130/80, pulse 60, height 6' (1.829 m), weight 177 lb (80.3 kg), SpO2 94 %. Systems examined below as of 10/16/16   General: No apparent distress alert and oriented x3 mood and affect normal, dressed appropriately.  HEENT: Pupils equal, extraocular movements intact  Respiratory: Patient's speak in full sentences and does not appear short of breath  Cardiovascular: No lower extremity edema, non tender, no erythema  Skin: Warm dry intact with no signs of infection or rash on extremities or on axial skeleton.  Abdomen: Soft nontender  Neuro: Cranial nerves II through XII are intact, neurovascularly intact in all extremities with 2+ DTRs and 2+ pulses.  Lymph: No lymphadenopathy of posterior or anterior cervical chain or axillae bilaterally.  Gait normal with good balance and coordination.  MSK:  Non tender with full range of motion and good stability and symmetric strength and tone of shoulders, elbows, wrist, hip, and ankles bilaterally.  Knee: Right Effusion noted.  Lacks the last 10 degrees flexion. Mild instability  Negative Mcmurray's, Apley's, and Thessalonian tests. Mild  painful patellar compression. Patellar glide without crepitus. Patellar and quadriceps tendons unremarkable. Hamstring and quadriceps strength is normal.  Contralateral knee mild arthritic changes  Procedure: Real-time Ultrasound Guided Injection of right knee Device: GE Logiq Q7 Ultrasound guided injection is preferred based studies that show increased duration, increased effect, greater accuracy, decreased procedural pain, increased response rate, and decreased cost with ultrasound  guided versus blind injection.  Verbal informed consent obtained.  Time-out conducted.  Noted no overlying erythema, induration, or other signs of local infection.  Skin prepped in a sterile fashion.  Local anesthesia: Topical Ethyl chloride.  With sterile technique and under real time ultrasound guidance: With a 22-gauge 2 inch needle patient was injected with 4 cc of 0.5% Marcaine and aspirated 40 mL of strawlike colored fluid then injected 1 cc of Kenalog 40 mg/dL. This was from a superior lateral approach.  Completed without difficulty  Pain immediately resolved suggesting accurate placement of the medication.  Advised to call if fevers/chills, erythema, induration, drainage, or persistent bleeding.  Images permanently stored and available for review in the ultrasound unit.  Impression: Technically successful ultrasound guided injection.   Impression and Recommendations:     This case required medical decision making of moderate complexity.      Note: This dictation was prepared with Dragon dictation along with smaller phrase technology. Any transcriptional errors that result from this process are unintentional.

## 2016-10-16 NOTE — Assessment & Plan Note (Signed)
Aspirated again today. We discussed with patient again with him having another potential synovitis but I do feel that either advance imaging could be warranted. We discussed which activities doing which ones to avoid. Discussed with him about the possibility of stability brace. We also discussed the possibility of viscous supplementation. Patient will consider this and follow-up with me again in 4 weeks swelling continues on a conservative therapy.

## 2016-11-03 ENCOUNTER — Telehealth: Payer: Self-pay | Admitting: Family Medicine

## 2016-11-03 NOTE — Telephone Encounter (Signed)
Patient is requesting call back in regard to "10 week" treatment plan that Dr. Tamala Julian was wanting patient to start.

## 2016-11-03 NOTE — Telephone Encounter (Signed)
Spoke with pt, advised him the next step would be to do the Synvisc injecitons. Pt states he will call his insurance to check to see how much they cover. He will call back if he decides to get the injections.

## 2016-11-15 NOTE — Progress Notes (Signed)
[name] [address] [place], [place] [address] Phone: [phone] Subjective:    CC: Bilateral knee pain  [id number]  Juan [name] is a 61 y.o. male coming in with complaint of bilateral knee arthritis.  Patient has been found to have severe osteoarthritic changes of the knee.  Questionable meniscal tear as well.  Has been doing relatively well with conservative therapy and last injections were 4 months ago.  Patient states  Starting to have worsening pain bilaterally.       Past Medical History:  Diagnosis Date  . Hypertension   . Personal history of colonic polyps - adenoma 04/24/2008   04/2008 - diminutive adenoma 06/13/2013     Past Surgical History:  Procedure Laterality Date  . APPENDECTOMY  2007   ruptured  . COLONOSCOPY     Social History   Socioeconomic History  . Marital status: Married    Spouse name: Not on file  . Number of children: Not on file  . Years of education: Not on file  . Highest education level: Not on file  Social Needs  . Financial resource strain: Not on file  . Food insecurity - worry: Not on file  . Food insecurity - inability: Not on file  . Transportation needs - medical: Not on file  . Transportation needs - non-medical: Not on file  Occupational History  . Not on file  Tobacco Use  . Smoking status: Former Smoker    Last attempt to quit: 01/06/1999    Years since quitting: 17.8  . Smokeless tobacco: Never Used  Substance and Sexual Activity  . Alcohol use: No  . 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 Sexual activity: Yes  Other Topics Concern  . Not on file  Social History Narrative   Regular exercise-Yes   He did rehab 12 years ago and has screened several times for HIV and Hep A/B/C and always negative.   Allergies  Allergen Reactions  . Ace Inhibitors     REACTION: cough   Family History  Problem Relation Age of Onset  . Colon cancer Neg  Hx   . Prostate cancer Father   . Heart disease Father      Past medical history, social, surgical and family history all reviewed in electronic medical record.  No pertanent information unless stated regarding to the chief complaint.   Review of Systems:Review of systems updated and as accurate as of 11/15/16  No headache, visual changes, nausea, vomiting, diarrhea, constipation, dizziness, abdominal pain, skin rash, fevers, chills, night sweats, weight loss, swollen lymph nodes, body aches, joint swelling, muscle aches, chest pain, shortness of breath, mood changes.   Objective  There were no vitals taken for this visit. Systems examined below as of 11/15/16   General: No apparent distress alert and oriented x3 mood and affect normal, dressed appropriately.  HEENT: Pupils equal, extraocular movements intact  Respiratory: Patient's speak in full sentences and does not appear short of breath  Cardiovascular: No lower extremity edema, non tender, no erythema  Skin: Warm dry intact with no signs of infection or rash on extremities or on axial skeleton.  Abdomen: Soft nontender  Neuro: Cranial nerves II through XII are intact, neurovascularly intact in all extremities with 2+ DTRs and 2+ pulses.  Lymph: No lymphadenopathy of posterior or anterior cervical chain or axillae bilaterally.  Gait normal with good balance and coordination.  MSK:  Non tender with full range of motion and good stability and symmetric strength and tone of shoulders, elbows, wrist, hip, and ankles bilaterally.  Knee: bilateral  valgus deformity noted. Abnormal thigh to calf ratio.  Tender to palpation over medial and PF joint line.  ROM full in flexion and extension and lower leg rotation. instability with valgus force.  painful patellar compression. Patellar glide with moderate crepitus. Patellar and quadriceps tendons unremarkable. Hamstring and quadriceps strength is normal.  After informed written and verbal  consent, patient was seated on exam table. Right knee was prepped with alcohol swab and utilizing anterolateral approach, patient's right knee space was injected with 4:1  marcaine 0.5%: Kenalog 40mg /dL. Patient tolerated the procedure well without immediate complications.  After informed written and verbal consent, patient was seated on exam table. Left knee was prepped with alcohol swab and utilizing anterolateral approach, patient's left knee space was injected with 4:1  marcaine 0.5%: Kenalog 40mg /dL. Patient tolerated the procedure well without immediate complications.   Impression and Recommendations:     This case required medical decision making of moderate complexity.      Note: This dictation was prepared with Dragon dictation along with smaller phrase technology. Any transcriptional errors that result from this process are unintentional.

## 2016-11-16 ENCOUNTER — Encounter: Payer: Self-pay | Admitting: Family Medicine

## 2016-11-16 ENCOUNTER — Ambulatory Visit (INDEPENDENT_AMBULATORY_CARE_PROVIDER_SITE_OTHER): Payer: BLUE CROSS/BLUE SHIELD | Admitting: Family Medicine

## 2016-11-16 DIAGNOSIS — M170 Bilateral primary osteoarthritis of knee: Secondary | ICD-10-CM

## 2016-11-16 NOTE — Assessment & Plan Note (Signed)
Repeat injection given.  We discussed icing regimen and home exercises.  We discussed which activities to do which wants to avoid.  Patient is to increase activity slowly over the course of the next several days. Discussed viscosupplementation  RTC in 4 weeks

## 2016-11-16 NOTE — Patient Instructions (Signed)
Good to see you  Injected both knees today  Keep trucking along.  If not better see me again in 3 weeks and we will consider synvisc injections.  Happy holidays!

## 2016-11-30 ENCOUNTER — Other Ambulatory Visit: Payer: Self-pay | Admitting: Internal Medicine

## 2016-11-30 DIAGNOSIS — E291 Testicular hypofunction: Secondary | ICD-10-CM

## 2016-11-30 DIAGNOSIS — E785 Hyperlipidemia, unspecified: Secondary | ICD-10-CM

## 2016-12-09 ENCOUNTER — Telehealth: Payer: Self-pay | Admitting: Internal Medicine

## 2016-12-09 NOTE — Telephone Encounter (Signed)
Copied from [place]. Topic: Quick Communication - Rx Refill/Question >> Dec 09, 2016  3:29 PM Robina Ade, Helene Kelp D wrote: Has the patient contacted their pharmacy? Yes (Agent: If no, request that the patient contact the pharmacy for the refill.) Preferred Pharmacy (with phone number or street name): [facility] [address] [place], [place] [address]. Agent: Please be advised that RX refills may take up to 3 business days. We ask that you follow-up with your pharmacy. Patient needs refill of losartan-hydrochlorothiazide (HYZAAR) 50-12.5 MG tablet enough for his next appt next week.

## 2016-12-10 MED ORDER — LOSARTAN POTASSIUM-HCTZ 50-12.5 MG PO TABS: 1.0000 | ORAL_TABLET | Freq: Every day | ORAL | 0 refills | Status: DC

## 2016-12-10 NOTE — Telephone Encounter (Signed)
Pt has OV 12/16/16  filled Losartan- HCTZ for 1 month

## 2016-12-16 ENCOUNTER — Ambulatory Visit: Payer: BLUE CROSS/BLUE SHIELD | Admitting: Internal Medicine

## 2016-12-21 ENCOUNTER — Ambulatory Visit (INDEPENDENT_AMBULATORY_CARE_PROVIDER_SITE_OTHER): Payer: BLUE CROSS/BLUE SHIELD | Admitting: Internal Medicine

## 2016-12-21 ENCOUNTER — Other Ambulatory Visit (INDEPENDENT_AMBULATORY_CARE_PROVIDER_SITE_OTHER): Payer: BLUE CROSS/BLUE SHIELD

## 2016-12-21 ENCOUNTER — Encounter: Payer: Self-pay | Admitting: Internal Medicine

## 2016-12-21 VITALS — BP 122/88 | HR 61 | Temp 98.3°F | Resp 16 | Ht 72.0 in | Wt 177.2 lb

## 2016-12-21 DIAGNOSIS — Z23 Encounter for immunization: Secondary | ICD-10-CM

## 2016-12-21 DIAGNOSIS — I10 Essential (primary) hypertension: Secondary | ICD-10-CM

## 2016-12-21 DIAGNOSIS — E781 Pure hyperglyceridemia: Secondary | ICD-10-CM | POA: Diagnosis not present

## 2016-12-21 DIAGNOSIS — E785 Hyperlipidemia, unspecified: Secondary | ICD-10-CM | POA: Diagnosis not present

## 2016-12-21 DIAGNOSIS — Z0000 Encounter for general adult medical examination without abnormal findings: Secondary | ICD-10-CM | POA: Diagnosis not present

## 2016-12-21 DIAGNOSIS — E291 Testicular hypofunction: Secondary | ICD-10-CM

## 2016-12-21 LAB — CBC WITH DIFFERENTIAL/PLATELET
BASOS ABS: 0.1 10*3/uL (ref 0.0–0.1)
BASOS PCT: 1 % (ref 0.0–3.0)
EOS ABS: 0.3 10*3/uL (ref 0.0–0.7)
Eosinophils Relative: 5.6 % — ABNORMAL HIGH (ref 0.0–5.0)
HCT: 46.6 % (ref 39.0–52.0)
HEMOGLOBIN: 15.5 g/dL (ref 13.0–17.0)
LYMPHS PCT: 17.3 % (ref 12.0–46.0)
Lymphs Abs: 1.1 10*3/uL (ref 0.7–4.0)
MCHC: 33.2 g/dL (ref 30.0–36.0)
MCV: 90.9 fl (ref 78.0–100.0)
Monocytes Absolute: 0.8 10*3/uL (ref 0.1–1.0)
Monocytes Relative: 13.1 % — ABNORMAL HIGH (ref 3.0–12.0)
NEUTROS ABS: 3.9 10*3/uL (ref 1.4–7.7)
Neutrophils Relative %: 63 % (ref 43.0–77.0)
PLATELETS: 368 10*3/uL (ref 150.0–400.0)
RBC: 5.13 Mil/uL (ref 4.22–5.81)
RDW: 13.9 % (ref 11.5–15.5)
WBC: 6.2 10*3/uL (ref 4.0–10.5)

## 2016-12-21 LAB — COMPREHENSIVE METABOLIC PANEL
ALT: 21 U/L (ref 0–53)
AST: 16 U/L (ref 0–37)
Albumin: 4.4 g/dL (ref 3.5–5.2)
Alkaline Phosphatase: 23 U/L — ABNORMAL LOW (ref 39–117)
BUN: 20 mg/dL (ref 6–23)
CHLORIDE: 103 meq/L (ref 96–112)
CO2: 29 meq/L (ref 19–32)
CREATININE: 0.93 mg/dL (ref 0.40–1.50)
Calcium: 9.4 mg/dL (ref 8.4–10.5)
GFR: 87.61 mL/min (ref >60.00)
Glucose, Bld: 87 mg/dL (ref 70–99)
Potassium: 4.2 mEq/L (ref 3.5–5.1)
Sodium: 138 mEq/L (ref 135–145)
Total Bilirubin: 0.5 mg/dL (ref 0.2–1.2)
Total Protein: 6.8 g/dL (ref 6.0–8.3)

## 2016-12-21 LAB — URINALYSIS, ROUTINE W REFLEX MICROSCOPIC
Bilirubin Urine: NEGATIVE
HGB URINE DIPSTICK: NEGATIVE
Ketones, ur: NEGATIVE
Leukocytes, UA: NEGATIVE
Nitrite: NEGATIVE
PH: 5.5 (ref 5.0–8.0)
SPECIFIC GRAVITY, URINE: 1.02 (ref 1.000–1.030)
TOTAL PROTEIN, URINE-UPE24: NEGATIVE
UROBILINOGEN UA: 0.2 (ref 0.0–1.0)
Urine Glucose: NEGATIVE

## 2016-12-21 LAB — TSH: TSH: 1.57 u[IU]/mL (ref 0.35–4.50)

## 2016-12-21 LAB — LIPID PANEL
CHOL/HDL RATIO: 3
Cholesterol: 209 mg/dL — ABNORMAL HIGH (ref 0–200)
HDL: 60.1 mg/dL (ref >39.00)
LDL CALC: 114 mg/dL — AB (ref 0–99)
NONHDL: 149.24
Triglycerides: 175 mg/dL — ABNORMAL HIGH (ref 0.0–149.0)
VLDL: 35 mg/dL (ref 0.0–40.0)

## 2016-12-21 LAB — PSA: PSA: 0.23 ng/mL (ref 0.10–4.00)

## 2016-12-21 MED ORDER — ZOSTER VAC RECOMB ADJUVANTED 50 MCG/0.5ML IM SUSR: 0.5000 mL | Freq: Once | INTRAMUSCULAR | 1 refills | Status: AC

## 2016-12-21 NOTE — Progress Notes (Signed)
Subjective:  Patient ID: Juan [name], male    DOB: June 30, 1955  Age: 61 y.o. MRN: [id number]  CC: Hypertension and Annual Exam   HPI Juan [name] presents for a CPX.  His BP has been well controlled. He feels well and offers no complaints today.  Outpatient Medications Prior to Visit  Medication Sig Dispense Refill  . aspirin 81 MG tablet Take 81 mg by mouth daily.      Marland Kitchen atorvastatin (LIPITOR) 20 MG tablet TAKE 1 TABLET BY MOUTH ONCE A DAY 90 tablet 3  . B-D 3CC LUER-LOK SYR 23GX1" 23G X 1" 3 ML MISC   0  . BD DISP NEEDLES 18G X 1-1/2" MISC   0  . Diclofenac Sodium (PENNSAID) 2 % SOLN Place 2 application onto the skin 2 (two) times daily. 112 g 3  . fish oil-omega-3 fatty acids 1000 MG capsule Take 2 g by mouth daily.      Marland Kitchen losartan-hydrochlorothiazide (HYZAAR) 50-12.5 MG tablet Take 1 tablet by mouth daily. 30 tablet 0  . Multiple Vitamin (MULTIVITAMIN PO) Take by mouth daily.      . tadalafil (CIALIS) 5 MG tablet Take 1 tablet (5 mg total) by mouth daily as needed for erectile dysfunction. 90 tablet 3  . testosterone cypionate (DEPOTESTOSTERONE CYPIONATE) 200 MG/ML injection INJECT 1ML INTO THE MUSCLE EVERY 14 DAYS 10 mL 0  . traZODone (DESYREL) 50 MG tablet TAKE 1 TO 2 TABLETS BY MOUTH DAILY AT BEDTIME AS NEEDED 180 tablet 2  . meloxicam (MOBIC) 15 MG tablet TAKE 1 TABLET (15 MG TOTAL) BY MOUTH DAILY. 90 tablet 1  . methocarbamol (ROBAXIN-750) 750 MG tablet Take 1 tablet (750 mg total) by mouth 3 (three) times daily. 90 tablet 2  . traMADol (ULTRAM) 50 MG tablet Take 1 tablet (50 mg total) by mouth every 6 (six) hours as needed. 20 tablet 0  . UNABLE TO FIND Med Name: Tribulus Terrestris . Takes 2 capsules daily    . methylPREDNISolone (MEDROL DOSEPAK) 4 MG TBPK tablet TAKE AS DIRECTED 21 tablet 0   No facility-administered medications prior to visit.     ROS Review of Systems  Constitutional: Negative for activity change, appetite change, diaphoresis, fatigue and unexpected  weight change.  HENT: Negative.   Respiratory: Negative for chest tightness and shortness of breath.   Cardiovascular: Negative for chest pain.  Gastrointestinal: Negative for abdominal pain, constipation and diarrhea.  Endocrine: Negative.   Genitourinary: Negative.  Negative for difficulty urinating, flank pain, hematuria, penile swelling, scrotal swelling and testicular pain.  Musculoskeletal: Negative.   Skin: Negative.   Allergic/Immunologic: Negative.   Neurological: Negative.  Negative for dizziness.  Hematological: Negative for adenopathy. Does not bruise/bleed easily.  Psychiatric/Behavioral: Negative.   All other systems reviewed and are negative.   Objective:  BP 122/88 (BP Location: Left Arm, Patient Position: Sitting, Cuff Size: Normal)   Pulse 61   Temp 98.3 F (36.8 C) (Oral)   Resp 16   Ht 6' (1.829 m)   Wt 177 lb 4 oz (80.4 kg)   SpO2 97%   BMI 24.04 kg/m   BP Readings from Last 3 Encounters:  12/21/16 122/88  11/16/16 120/80  10/16/16 130/80    Wt Readings from Last 3 Encounters:  12/21/16 177 lb 4 oz (80.4 kg)  11/16/16 177 lb (80.3 kg)  10/16/16 177 lb (80.3 kg)    Physical Exam  Constitutional: He is oriented to person, place, and time. No distress.  HENT:  Mouth/Throat:  Oropharynx is clear and moist. No oropharyngeal exudate.  Eyes: Conjunctivae are normal. Left eye exhibits no discharge. No scleral icterus.  Neck: Normal range of motion. Neck supple. No JVD present. No thyromegaly present.  Cardiovascular: Normal rate, regular rhythm and normal heart sounds. Exam reveals no gallop.  No murmur heard. Pulmonary/Chest: Effort normal and breath sounds normal. No respiratory distress. He has no wheezes. He has no rales.  Abdominal: Soft. Bowel sounds are normal. He exhibits no distension. There is no rebound and no guarding. Hernia confirmed negative in the right inguinal area and confirmed negative in the left inguinal area.  Genitourinary: Rectum  normal, prostate normal, testes normal and penis normal. Rectal exam shows no external hemorrhoid, no internal hemorrhoid, no fissure, no mass, no tenderness, anal tone normal and guaiac negative stool. Prostate is not enlarged and not tender. Right testis shows no mass, no swelling and no tenderness. Right testis is descended. Left testis shows no mass, no swelling and no tenderness. Left testis is descended. Circumcised. No penile erythema or penile tenderness. No discharge found.  Musculoskeletal: Normal range of motion. He exhibits no edema or tenderness.  Lymphadenopathy:    He has no cervical adenopathy.       Right: No inguinal adenopathy present.       Left: No inguinal adenopathy present.  Neurological: He is alert and oriented to person, place, and time.  Skin: Skin is warm and dry. No rash noted. He is not diaphoretic. No erythema. No pallor.  Psychiatric: He has a normal mood and affect. His behavior is normal. Judgment and thought content normal.  Vitals reviewed.   Lab Results  Component Value Date   WBC 6.2 12/21/2016   HGB 15.5 12/21/2016   HCT 46.6 12/21/2016   PLT 368.0 12/21/2016   GLUCOSE 87 12/21/2016   CHOL 209 (H) 12/21/2016   TRIG 175.0 (H) 12/21/2016   HDL 60.10 12/21/2016   LDLDIRECT 119.3 07/11/2012   LDLCALC 114 (H) 12/21/2016   ALT 21 12/21/2016   AST 16 12/21/2016   NA 138 12/21/2016   K 4.2 12/21/2016   CL 103 12/21/2016   CREATININE 0.93 12/21/2016   BUN 20 12/21/2016   CO2 29 12/21/2016   TSH 1.57 12/21/2016   PSA 0.23 12/21/2016   HGBA1C 5.3 07/13/2012    Dg Knee 4 Views W/patella Right  Result Date: 10/10/2015 CLINICAL DATA:  Pain and swelling after hiking EXAM: RIGHT KNEE - COMPLETE 4+ VIEW COMPARISON:  None. FINDINGS: Weightbearing frontal, weight-bearing tunnel, weight-bearing lateral, and sunrise patellar images were obtained. There is no fracture or dislocation. There is a focal joint effusion. There is mild joint space narrowing  medially. Other joint spaces appear unremarkable. No erosive change. IMPRESSION: Joint space narrowing medially. Joint effusion present. No acute fracture or dislocation. Electronically Signed   By: Lowella Grip III M.D.   On: 10/10/2015 14:16    Assessment & Plan:   Leeman was seen today for hypertension and annual exam.  Diagnoses and all orders for this visit:  Essential hypertension- His BP is well controlled.  Electrolytes and renal function are normal now. -     Comprehensive metabolic panel; Future -     CBC with Differential/Platelet; Future -     TSH; Future -     Urinalysis, Routine w reflex microscopic; Future  Hypogonadism male- He is doing well on the current dose of testosterone.  His testosterone level is in the normal range and his lab work is negative  for any evidence of complications.  Will continue with current dose. -     CBC with Differential/Platelet; Future -     Testosterone Total,Free,Bio, Males; Future  Hyperlipidemia with target LDL less than 100- He has achieved his LDL goal is doing well on the statin. -     Comprehensive metabolic panel; Future  HYPERTRIGLYCERIDEMIA- improvement noted  Hyperlipidemia LDL goal <130 -     TSH; Future  Routine general medical examination at a health care facility- Exam completed, labs ordered and reviewed, vaccines reviewed and updated, screening for colon cancer is up-to-date, patient education material was given. -     Lipid panel; Future -     PSA; Future -     Zoster Vaccine Adjuvanted [facility]) injection; Inject 0.5 mLs into the muscle once for 1 dose.  Need for influenza vaccination -     Flu Vaccine QUAD 36+ mos IM   I have discontinued Milbert Coulter Rossman's UNABLE TO FIND, traMADol, methocarbamol, methylPREDNISolone, and meloxicam. I am also having him start on Zoster Vaccine Adjuvanted. Additionally, I am having him maintain his Multiple Vitamin (MULTIVITAMIN PO), fish oil-omega-3 fatty acids, aspirin, tadalafil,  BD DISP NEEDLES, B-D 3CC LUER-LOK SYR 23GX1", Diclofenac Sodium, atorvastatin, traZODone, testosterone cypionate, and losartan-hydrochlorothiazide.  Meds ordered this encounter  Medications  . Zoster Vaccine Adjuvanted [facility]) injection    Sig: Inject 0.5 mLs into the muscle once for 1 dose.    Dispense:  0.5 mL    Refill:  1     Follow-up: Return in about 6 months (around 06/21/2017).  Scarlette Calico, MD

## 2016-12-21 NOTE — Patient Instructions (Signed)

## 2016-12-22 ENCOUNTER — Encounter: Payer: Self-pay | Admitting: Internal Medicine

## 2016-12-22 ENCOUNTER — Telehealth: Payer: Self-pay

## 2016-12-22 LAB — TESTOSTERONE TOTAL,FREE,BIO, MALES
ALBUMIN MSPROF: 4.3 g/dL (ref 3.6–5.1)
SEX HORMONE BINDING: 64 nmol/L (ref 22–77)
TESTOSTERONE: 575 ng/dL (ref 250–827)
Testosterone, Bioavailable: 86.6 ng/dL — ABNORMAL LOW (ref >=110.0)
Testosterone, Free: 44 pg/mL — ABNORMAL LOW (ref 46.0–224.0)

## 2016-12-22 NOTE — Telephone Encounter (Signed)
Patient has been added to wait list.

## 2016-12-22 NOTE — Telephone Encounter (Signed)
-----   Message from [facility], [place] sent at 12/22/2016  8:58 AM EST ----- Regarding: Shingles Can we add this pt to the Shingles wait list.

## 2016-12-24 ENCOUNTER — Telehealth: Payer: Self-pay | Admitting: Internal Medicine

## 2016-12-24 DIAGNOSIS — E291 Testicular hypofunction: Secondary | ICD-10-CM

## 2016-12-24 DIAGNOSIS — E785 Hyperlipidemia, unspecified: Secondary | ICD-10-CM

## 2016-12-24 MED ORDER — ATORVASTATIN CALCIUM 20 MG PO TABS: 20.0000 mg | ORAL_TABLET | Freq: Every day | ORAL | 3 refills | Status: DC

## 2016-12-24 MED ORDER — TRAZODONE HCL 50 MG PO TABS: ORAL_TABLET | ORAL | 2 refills | Status: DC

## 2016-12-24 MED ORDER — LOSARTAN POTASSIUM-HCTZ 50-12.5 MG PO TABS: 1.0000 | ORAL_TABLET | Freq: Every day | ORAL | 12 refills | Status: DC

## 2016-12-24 MED ORDER — TESTOSTERONE CYPIONATE 200 MG/ML IM SOLN: INTRAMUSCULAR | 0 refills | Status: DC

## 2016-12-24 NOTE — Telephone Encounter (Signed)
OV for all refills: 12/21/16  LR: trazodone:03/30/16 180 tabs 2 refills LR: losartan-HCTZ 12/10/16 30 tabs 0 refills LR: testosterone 06/29/16 10 ml o refill

## 2016-12-24 NOTE — Telephone Encounter (Signed)
Copied from [place]. Topic: Quick Communication - Rx Refill/Question >> Dec 24, 2016 10:15 AM Synthia Innocent wrote: Has the patient contacted their pharmacy? Yes.     (Agent: If no, request that the patient contact the pharmacy for the refill.)   Preferred Pharmacy (with phone number or street name): [facility] [address]   Agent: Please be advised that RX refills may take up to 3 business days. We ask that you follow-up with your pharmacy. Requesting refill on traZODone (DESYREL) 50 MG tablet, losartan-hydrochlorothiazide (HYZAAR) 50-12.5 MG tablet, testosterone cypionate (DEPOTESTOSTERONE CYPIONATE) 200 MG/ML injection and atorvastatin (LIPITOR) 20 MG tablet

## 2016-12-24 NOTE — Telephone Encounter (Signed)
Atorvastin filled 12/10/15 90 tabs 3 refills  OV 12/21/16

## 2016-12-24 NOTE — Telephone Encounter (Signed)
All meds refilled x 4

## 2016-12-30 ENCOUNTER — Other Ambulatory Visit: Payer: Self-pay | Admitting: Internal Medicine

## 2017-01-06 NOTE — Telephone Encounter (Signed)
Pt said he needs the testosterone medicine sent to [facility].

## 2017-01-06 NOTE — Telephone Encounter (Signed)
Please see patient's request today. / Testosterone looks like it printed.  If so, can it be faxed to the pharmacy. / Patient is also asking about Acxion.  Not listed on his med list

## 2017-01-06 NOTE — Telephone Encounter (Signed)
He wants to know if axion could be sent it too to see if insurance will cover it.

## 2017-01-21 ENCOUNTER — Other Ambulatory Visit: Payer: Self-pay | Admitting: Internal Medicine

## 2017-01-21 ENCOUNTER — Telehealth: Payer: Self-pay | Admitting: Internal Medicine

## 2017-01-21 DIAGNOSIS — E291 Testicular hypofunction: Secondary | ICD-10-CM

## 2017-01-21 MED ORDER — TESTOSTERONE CYPIONATE 200 MG/ML IM SOLN: INTRAMUSCULAR | 0 refills | Status: DC

## 2017-01-21 NOTE — Telephone Encounter (Signed)
Routed to provider

## 2017-01-21 NOTE — Telephone Encounter (Signed)
Is the pharmacy asking for a PA to be completed or are they asking for the prescription to be sent again?

## 2017-01-21 NOTE — Telephone Encounter (Signed)
Step... do you have a PA for this patient...Juan [name]

## 2017-01-21 NOTE — Telephone Encounter (Signed)
Copied from [place] [id number]. Topic: Quick Communication - Rx Refill/Question >> Jan 21, 2017 11:26 AM Ahmed Prima L wrote: Medication: testosterone cypionate (DEPOTESTOSTERONE CYPIONATE) 200 MG/ML injection   Has the patient contacted their pharmacy? yes  (Agent: If no, request that the patient contact the pharmacy for the refill.)   Preferred Pharmacy (with phone number or street name):  [facility] [place], [place] needs a verification confirmation on this before he could ask for it to be filled again in the next week.   Agent: Please be advised that RX refills may take up to 3 business days. We ask that you follow-up with your pharmacy.

## 2017-01-21 NOTE — Telephone Encounter (Signed)
This pt has insurance that PA can not be done electronically. This will not be completed until tomorrow.

## 2017-01-21 NOTE — Telephone Encounter (Signed)
Check [place] registry last filled 06/30/2016...Johny Chess

## 2017-01-22 NOTE — Telephone Encounter (Signed)
Contacted [place] at [phone] to start PA   [place] is resending verification form to be completed. [place] requires 2 testosterone labs to be completed before 10 am. Pt informed of same and stated he would be in on Monday morning to do lab. Lab order has been entered.

## 2017-01-25 ENCOUNTER — Other Ambulatory Visit: Payer: BLUE CROSS/BLUE SHIELD

## 2017-01-25 DIAGNOSIS — E291 Testicular hypofunction: Secondary | ICD-10-CM

## 2017-01-26 ENCOUNTER — Encounter: Payer: Self-pay | Admitting: Internal Medicine

## 2017-01-26 LAB — TESTOSTERONE TOTAL,FREE,BIO, MALES
Albumin: 4.5 g/dL (ref 3.6–5.1)
Sex Hormone Binding: 74 nmol/L (ref 22–77)
Testosterone: 249 ng/dL — ABNORMAL LOW (ref 250–827)

## 2017-01-27 NOTE — Telephone Encounter (Signed)
PA form has been received and completed.  Form given to PCP for signature.

## 2017-03-02 NOTE — Progress Notes (Signed)
[name] [address] [place], [place] [address] Phone: [phone] Subjective:      CC: Bilateral knee pain  SVX:[id number]  Juan [name] is a 62 y.o. male coming in with complaint of bilateral knee pain.  Found to have arthritic changes.  Has responded fairly well to osteopathic manipulations.  Patient states that both knees stiffen and ache with yoga. Right worse than left.  Patient states that it is starting to affect daily activities at this point.    Past Medical History:  Diagnosis Date  . Hypertension   . Personal history of colonic polyps - adenoma 04/24/2008   04/2008 - diminutive adenoma 06/13/2013     Past Surgical History:  Procedure Laterality Date  . APPENDECTOMY  2007   ruptured  . COLONOSCOPY     Social History   Socioeconomic History  . Marital status: Married    Spouse name: None  . Number of children: None  . Years of education: None  . Highest education level: None  Social Needs  . Financial resource strain: None  . Food insecurity - worry: None  . Food insecurity - inability: None  . Transportation needs - medical: None  . Transportation needs - non-medical: None  Occupational History  . None  Tobacco Use  . Smoking status: Former Smoker    Last attempt to quit: 01/06/1999    Years since quitting: 18.1  . Smokeless tobacco: Never Used  Substance and Sexual Activity  . Alcohol use: No  . 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 Sexual activity: Yes  Other Topics Concern  . None  Social History Narrative   Regular exercise-Yes   He did rehab 12 years ago and has screened several times for HIV and Hep A/B/C and always negative.   Allergies  Allergen Reactions  . Ace Inhibitors     REACTION: cough   Family History  Problem Relation Age of Onset  . Colon cancer Neg Hx   . Prostate cancer Father   . Heart disease Father      Past medical history, social,  surgical and family history all reviewed in electronic medical record.  No pertanent information unless stated regarding to the chief complaint.   Review of Systems:Review of systems updated and as accurate as of 03/03/17  No headache, visual changes, nausea, vomiting, diarrhea, constipation, dizziness, abdominal pain, skin rash, fevers, chills, night sweats, weight loss, swollen lymph nodes, body aches, joint swelling, muscle aches, chest pain, shortness of breath, mood changes.   Objective  Blood pressure 110/82, pulse 74, weight 175 lb (79.4 kg), SpO2 97 %. Systems examined below as of 03/03/17   General: No apparent distress alert and oriented x3 mood and affect normal, dressed appropriately.  HEENT: Pupils equal, extraocular movements intact  Respiratory: Patient's speak in full sentences and does not appear short of breath  Cardiovascular: No lower extremity edema, non tender, no erythema  Skin: Warm dry intact with no signs of infection or rash on extremities or on axial skeleton.  Abdomen: Soft nontender  Neuro: Cranial nerves II through XII are intact, neurovascularly intact in all extremities with 2+ DTRs and 2+ pulses.  Lymph: No lymphadenopathy of posterior or anterior cervical chain or axillae bilaterally.  Gait normal with good balance and coordination.  MSK:  Non tender with full range of motion and good stability and symmetric strength and tone of shoulders, elbows,  wrist, hip, and ankles bilaterally.  Knee: Bilateral valgus deformity noted.  Tender to palpation over medial and PF joint line.  ROM full in flexion and extension and lower leg rotation. instability with valgus force.  Mild painful patellar compression. Patellar glide with mild crepitus. Patellar and quadriceps tendons unremarkable. Hamstring and quadriceps strength is normal.  After informed written and verbal consent, patient was seated on exam table. Right knee was prepped with alcohol swab and utilizing  anterolateral approach, patient's right knee space was injected with 4:1  marcaine 0.5%: Kenalog 40mg /dL. Patient tolerated the procedure well without immediate complications.  After informed written and verbal consent, patient was seated on exam table. Left knee was prepped with alcohol swab and utilizing anterolateral approach, patient's left knee space was injected with 4:1  marcaine 0.5%: Kenalog 40mg /dL. Patient tolerated the procedure well without immediate complications.     Impression and Recommendations:     This case required medical decision making of moderate complexity.      Note: This dictation was prepared with Dragon dictation along with smaller phrase technology. Any transcriptional errors that result from this process are unintentional.

## 2017-03-03 ENCOUNTER — Encounter: Payer: Self-pay | Admitting: Family Medicine

## 2017-03-03 ENCOUNTER — Ambulatory Visit (INDEPENDENT_AMBULATORY_CARE_PROVIDER_SITE_OTHER): Payer: PRIVATE HEALTH INSURANCE | Admitting: Family Medicine

## 2017-03-03 DIAGNOSIS — M170 Bilateral primary osteoarthritis of knee: Secondary | ICD-10-CM | POA: Diagnosis not present

## 2017-03-03 NOTE — Assessment & Plan Note (Signed)
Bilateral injections given today.  Known to have mostly medial compartment arthritis.  It is tricompartmental though is still noted.  We discussed icing regimen and home exercises.  Which activities to do which wants to avoid.  Follow-up with me again in 4 weeks if he wants to consider Visco supplementation.  Otherwise can repeat steroid injections every 10-12 weeks.

## 2017-03-03 NOTE — Patient Instructions (Signed)
Good to see you  You know the drill  Lets keep it going  Can repeat in 10 weeks if needed if pain comes back have the other injection s

## 2017-03-09 ENCOUNTER — Telehealth: Payer: Self-pay | Admitting: Internal Medicine

## 2017-03-09 NOTE — Telephone Encounter (Signed)
Copied from [place] [id number]. Topic: General - Other >> Mar 09, 2017  8:03 AM Darl Householder, RMA wrote: Reason for CRM: Patient is requesting a prescription for prednisone for back spasms please send to [facility] [address]

## 2017-03-09 NOTE — Telephone Encounter (Signed)
Pt needs an appt

## 2017-03-11 NOTE — Telephone Encounter (Signed)
Advised patient of dr Ronnald Ramp note---patient is going to try Yoga first, then will call back if no better to make appt

## 2017-04-27 ENCOUNTER — Other Ambulatory Visit: Payer: Self-pay | Admitting: Internal Medicine

## 2017-04-27 ENCOUNTER — Telehealth: Payer: Self-pay

## 2017-04-27 DIAGNOSIS — E291 Testicular hypofunction: Secondary | ICD-10-CM

## 2017-04-27 MED ORDER — TESTOSTERONE CYPIONATE 200 MG/ML IM SOLN: INTRAMUSCULAR | 1 refills | Status: DC

## 2017-04-27 NOTE — Telephone Encounter (Signed)
Appointment scheduled.

## 2017-04-27 NOTE — Telephone Encounter (Signed)
Dr. Ronnald Ramp has sent in the refill.  Will you call have have him schedule CPE or follow up?

## 2017-04-27 NOTE — Telephone Encounter (Addendum)
rf rq for testosterone to [facility] on [address]. LOV 12/2016. No appt for follow up in June.   Please advise on rf rq? Will have pt contacted for follow up.

## 2017-06-21 ENCOUNTER — Encounter: Payer: Self-pay | Admitting: Internal Medicine

## 2017-06-21 ENCOUNTER — Ambulatory Visit: Payer: Self-pay | Admitting: Internal Medicine

## 2017-06-21 ENCOUNTER — Other Ambulatory Visit (INDEPENDENT_AMBULATORY_CARE_PROVIDER_SITE_OTHER): Payer: Self-pay

## 2017-06-21 VITALS — BP 130/82 | HR 65 | Temp 97.9°F | Resp 16 | Ht 72.0 in | Wt 175.0 lb

## 2017-06-21 DIAGNOSIS — E291 Testicular hypofunction: Secondary | ICD-10-CM

## 2017-06-21 DIAGNOSIS — B351 Tinea unguium: Secondary | ICD-10-CM

## 2017-06-21 DIAGNOSIS — M79675 Pain in left toe(s): Secondary | ICD-10-CM

## 2017-06-21 DIAGNOSIS — I10 Essential (primary) hypertension: Secondary | ICD-10-CM

## 2017-06-21 LAB — CBC WITH DIFFERENTIAL/PLATELET
BASOS ABS: 0 10*3/uL (ref 0.0–0.1)
Basophils Relative: 1 % (ref 0.0–3.0)
EOS PCT: 4.4 % (ref 0.0–5.0)
Eosinophils Absolute: 0.2 10*3/uL (ref 0.0–0.7)
HEMATOCRIT: 43.5 % (ref 39.0–52.0)
Hemoglobin: 15 g/dL (ref 13.0–17.0)
LYMPHS PCT: 20 % (ref 12.0–46.0)
Lymphs Abs: 1 10*3/uL (ref 0.7–4.0)
MCHC: 34.6 g/dL (ref 30.0–36.0)
MCV: 88.6 fl (ref 78.0–100.0)
MONOS PCT: 12.5 % — AB (ref 3.0–12.0)
Monocytes Absolute: 0.6 10*3/uL (ref 0.1–1.0)
Neutro Abs: 3 10*3/uL (ref 1.4–7.7)
Neutrophils Relative %: 62.1 % (ref 43.0–77.0)
Platelets: 332 10*3/uL (ref 150.0–400.0)
RBC: 4.91 Mil/uL (ref 4.22–5.81)
RDW: 13.8 % (ref 11.5–15.5)
WBC: 4.8 10*3/uL (ref 4.0–10.5)

## 2017-06-21 LAB — COMPREHENSIVE METABOLIC PANEL
ALBUMIN: 4.4 g/dL (ref 3.5–5.2)
ALK PHOS: 24 U/L — AB (ref 39–117)
ALT: 20 U/L (ref 0–53)
AST: 17 U/L (ref 0–37)
BILIRUBIN TOTAL: 0.5 mg/dL (ref 0.2–1.2)
BUN: 25 mg/dL — ABNORMAL HIGH (ref 6–23)
CALCIUM: 9.6 mg/dL (ref 8.4–10.5)
CO2: 27 mEq/L (ref 19–32)
Chloride: 102 mEq/L (ref 96–112)
Creatinine, Ser: 0.94 mg/dL (ref 0.40–1.50)
GFR: 86.39 mL/min (ref >60.00)
Glucose, Bld: 103 mg/dL — ABNORMAL HIGH (ref 70–99)
Potassium: 4 mEq/L (ref 3.5–5.1)
Sodium: 138 mEq/L (ref 135–145)
TOTAL PROTEIN: 6.9 g/dL (ref 6.0–8.3)

## 2017-06-21 MED ORDER — TERBINAFINE HCL 250 MG PO TABS: 250.0000 mg | ORAL_TABLET | Freq: Every day | ORAL | 0 refills | Status: DC

## 2017-06-21 NOTE — Progress Notes (Signed)
Subjective:  Patient ID: Juan [name], male    DOB: 05/12/1955  Age: 62 y.o. MRN: [id number]  CC: Hypertension   HPI Juan [name] presents for f/up - he tells me his blood pressure is well controlled.  He is very active and has had no recent episodes of CP, DOE, palpitations, edema, or fatigue.  He complains of a one year history of discomfort and discoloration in his left great toe.  Outpatient Medications Prior to Visit  Medication Sig Dispense Refill  . aspirin 81 MG tablet Take 81 mg by mouth daily.      Marland Kitchen atorvastatin (LIPITOR) 20 MG tablet Take 1 tablet (20 mg total) by mouth daily. 90 tablet 3  . B-D 3CC LUER-LOK SYR 23GX1" 23G X 1" 3 ML MISC   0  . BD DISP NEEDLES 18G X 1-1/2" MISC   0  . Diclofenac Sodium (PENNSAID) 2 % SOLN Place 2 application onto the skin 2 (two) times daily. 112 g 3  . fish oil-omega-3 fatty acids 1000 MG capsule Take 2 g by mouth daily.      Marland Kitchen losartan-hydrochlorothiazide (HYZAAR) 50-12.5 MG tablet Take 1 tablet by mouth daily. 30 tablet 12  . Multiple Vitamin (MULTIVITAMIN PO) Take by mouth daily.      . tadalafil (CIALIS) 5 MG tablet Take 1 tablet (5 mg total) by mouth daily as needed for erectile dysfunction. 90 tablet 3  . testosterone cypionate (DEPOTESTOSTERONE CYPIONATE) 200 MG/ML injection INJECT 1ML INTO THE MUSCLE EVERY 14 DAYS 10 mL 1  . traZODone (DESYREL) 50 MG tablet TAKE 1 TO 2 TABLETS BY MOUTH DAILY AT BEDTIME AS NEEDED 180 tablet 2   No facility-administered medications prior to visit.     ROS Review of Systems  Constitutional: Negative.  Negative for appetite change, diaphoresis, fatigue and unexpected weight change.  HENT: Negative.   Eyes: Negative.   Respiratory: Negative.  Negative for cough, chest tightness, shortness of breath and wheezing.   Cardiovascular: Negative for chest pain, palpitations and leg swelling.  Gastrointestinal: Negative for abdominal pain, constipation, diarrhea, nausea and vomiting.  Endocrine:  Negative.   Genitourinary: Negative.  Negative for difficulty urinating.  Musculoskeletal: Negative.  Negative for neck stiffness.  Skin: Negative.   Neurological: Negative.  Negative for dizziness, weakness and light-headedness.  Hematological: Negative for adenopathy. Does not bruise/bleed easily.  Psychiatric/Behavioral: Negative.     Objective:  BP 130/82 (BP Location: Left Arm, Patient Position: Sitting, Cuff Size: Normal)   Pulse 65   Temp 97.9 F (36.6 C) (Oral)   Resp 16   Ht 6' (1.829 m)   Wt 175 lb (79.4 kg)   SpO2 97%   BMI 23.73 kg/m   BP Readings from Last 3 Encounters:  06/21/17 130/82  03/03/17 110/82  12/21/16 122/88    Wt Readings from Last 3 Encounters:  06/21/17 175 lb (79.4 kg)  03/03/17 175 lb (79.4 kg)  12/21/16 177 lb 4 oz (80.4 kg)    Physical Exam  Constitutional: He is oriented to person, place, and time. No distress.  HENT:  Mouth/Throat: Oropharynx is clear and moist. No oropharyngeal exudate.  Eyes: Conjunctivae are normal. No scleral icterus.  Neck: Normal range of motion. Neck supple. No JVD present. No thyromegaly present.  Cardiovascular: Normal rate, regular rhythm and normal heart sounds. Exam reveals no gallop.  No murmur heard. Pulmonary/Chest: Effort normal and breath sounds normal. He has no wheezes. He has no rales.  Abdominal: Soft. Normal appearance and bowel sounds  are normal. He exhibits no distension and no mass. There is no hepatosplenomegaly. There is no guarding.  Musculoskeletal: Normal range of motion. He exhibits no edema, tenderness or deformity.  Lymphadenopathy:    He has no cervical adenopathy.  Neurological: He is alert and oriented to person, place, and time.  Skin: Skin is warm and dry. No rash noted. He is not diaphoretic.  The entirety of the left great toenail shows whitish subungual debris with nail thickening and distal lysis.  There is no tenderness, erythema, exudate, or warmth.  Vitals  reviewed.   Lab Results  Component Value Date   WBC 4.8 06/21/2017   HGB 15.0 06/21/2017   HCT 43.5 06/21/2017   PLT 332.0 06/21/2017   GLUCOSE 103 (H) 06/21/2017   CHOL 209 (H) 12/21/2016   TRIG 175.0 (H) 12/21/2016   HDL 60.10 12/21/2016   LDLDIRECT 119.3 07/11/2012   LDLCALC 114 (H) 12/21/2016   ALT 20 06/21/2017   AST 17 06/21/2017   NA 138 06/21/2017   K 4.0 06/21/2017   CL 102 06/21/2017   CREATININE 0.94 06/21/2017   BUN 25 (H) 06/21/2017   CO2 27 06/21/2017   TSH 1.57 12/21/2016   PSA 0.23 12/21/2016   HGBA1C 5.3 07/13/2012    Dg Knee 4 Views W/patella Right  Result Date: 10/10/2015 CLINICAL DATA:  Pain and swelling after hiking EXAM: RIGHT KNEE - COMPLETE 4+ VIEW COMPARISON:  None. FINDINGS: Weightbearing frontal, weight-bearing tunnel, weight-bearing lateral, and sunrise patellar images were obtained. There is no fracture or dislocation. There is a focal joint effusion. There is mild joint space narrowing medially. Other joint spaces appear unremarkable. No erosive change. IMPRESSION: Joint space narrowing medially. Joint effusion present. No acute fracture or dislocation. Electronically Signed   By: Lowella Grip III M.D.   On: 10/10/2015 14:16    Assessment & Plan:   Juan [name] was seen today for hypertension.  Diagnoses and all orders for this visit:  Essential hypertension- His blood pressure is well controlled.  Electrolytes and renal function are normal. -     Comprehensive metabolic panel; Future -     CBC with Differential/Platelet; Future  Hypogonadism male-    I will monitor his testosterone level.  His lab work is negative for erythrocytosis or elevated liver enzymes.  For now, will continue the testosterone at the current dose. -     Testosterone Total,Free,Bio, Males; Future  Pain due to onychomycosis of toenail of left foot- His liver enzymes are normal.  He will take terbinafine once a day for the next 4 weeks.  He was advised to notify me if he  develops any side effects related to this.  I have asked him return in about 3 to 4 weeks to have his liver enzymes rechecked. -     Comprehensive metabolic panel; Future -     terbinafine (LAMISIL) 250 MG tablet; Take 1 tablet (250 mg total) by mouth daily.   I am having Juan [name] start on terbinafine. I am also having him maintain his Multiple Vitamin (MULTIVITAMIN PO), fish oil-omega-3 fatty acids, aspirin, tadalafil, BD DISP NEEDLES, B-D 3CC LUER-LOK SYR 23GX1", Diclofenac Sodium, traZODone, losartan-hydrochlorothiazide, atorvastatin, and testosterone cypionate.  Meds ordered this encounter  Medications  . terbinafine (LAMISIL) 250 MG tablet    Sig: Take 1 tablet (250 mg total) by mouth daily.    Dispense:  30 tablet    Refill:  0     Follow-up: Return in about 1 month (around 07/19/2017).  Scarlette Calico, MD

## 2017-06-21 NOTE — Patient Instructions (Signed)

## 2017-06-23 LAB — TESTOSTERONE TOTAL,FREE,BIO, MALES
Albumin: 4.6 g/dL (ref 3.6–5.1)
SEX HORMONE BINDING: 58 nmol/L (ref 22–77)
TESTOSTERONE BIOAVAILABLE: 89.8 ng/dL — AB (ref >=110.0)
TESTOSTERONE FREE: 42.8 pg/mL — AB (ref 46.0–224.0)
Testosterone: 522 ng/dL (ref 250–827)

## 2017-06-23 NOTE — Progress Notes (Signed)
[name] [address] [place], [place] [address] Phone: [phone] Subjective:     CC: Right knee pain  [id number]  Juan [name] is a 62 y.o. male coming in with complaint of right knee pain.  Does have arthritic changes.  Follows all have the questionable meniscal tear.  Has done all conservative therapy.  Last time was injected many months ago.  Has responded very well.  Continues to stay active.  Rates the severity of pain as 6 out of 10 and worsening.  Some recent swelling some nighttime pain discussed patient to come back.     Past Medical History:  Diagnosis Date  . Hypertension   . Personal history of colonic polyps - adenoma 04/24/2008   04/2008 - diminutive adenoma 06/13/2013     Past Surgical History:  Procedure Laterality Date  . APPENDECTOMY  2007   ruptured  . COLONOSCOPY     Social History   Socioeconomic History  . Marital status: Married    Spouse name: Not on file  . Number of children: Not on file  . Years of education: Not on file  . Highest education level: Not on file  Occupational History  . Not on file  Social Needs  . Financial resource strain: Not on file  . Food insecurity:    Worry: Not on file    Inability: Not on file  . Transportation needs:    Medical: Not on file    Non-medical: Not on file  Tobacco Use  . Smoking status: Former Smoker    Last attempt to quit: 01/06/1999    Years since quitting: 18.4  . Smokeless tobacco: Never Used  Substance and Sexual Activity  . Alcohol use: No  . 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 Sexual activity: Yes  Lifestyle  . Physical activity:    Days per week: Not on file    Minutes per session: Not on file  . Stress: Not on file  Relationships  . Social connections:    Talks on phone: Not on file    Gets together: Not on file    Attends religious service: Not on file    Active member of club or organization:  Not on file    Attends meetings of clubs or organizations: Not on file    Relationship status: Not on file  Other Topics Concern  . Not on file  Social History Narrative   Regular exercise-Yes   He did rehab 12 years ago and has screened several times for HIV and Hep A/B/C and always negative.   Allergies  Allergen Reactions  . Ace Inhibitors     REACTION: cough   Family History  Problem Relation Age of Onset  . Colon cancer Neg Hx   . Prostate cancer Father   . Heart disease Father      Past medical history, social, surgical and family history all reviewed in electronic medical record.  No pertanent information unless stated regarding to the chief complaint.   Review of Systems:Review of systems updated and as accurate as of 06/24/17  No headache, visual changes, nausea, vomiting, diarrhea, constipation, dizziness, abdominal pain, skin rash, fevers, chills, night sweats, weight loss, swollen lymph nodes, body aches,, chest pain, shortness of breath, mood changes.  Mild joint swelling and muscle aches  Objective  Blood pressure 118/70, pulse 68, height 6' (1.829 m), weight 177 lb (80.3  kg), SpO2 97 %. Systems examined below as of 06/24/17   General: No apparent distress alert and oriented x3 mood and affect normal, dressed appropriately.  HEENT: Pupils equal, extraocular movements intact  Respiratory: Patient's speak in full sentences and does not appear short of breath  Cardiovascular: No lower extremity edema, non tender, no erythema  Skin: Warm dry intact with no signs of infection or rash on extremities or on axial skeleton.  Abdomen: Soft nontender  Neuro: Cranial nerves II through XII are intact, neurovascularly intact in all extremities with 2+ DTRs and 2+ pulses.  Lymph: No lymphadenopathy of posterior or anterior cervical chain or axillae bilaterally.  Gait mild antalgic MSK:  Non tender with full range of motion and good stability and symmetric strength and tone of  shoulders, elbows, wrist, hip, and ankles bilaterally.  Right knee exam shows some valgus deformity.  Tenderness over the medial and lateral joint line.  Mild instability noted on exam mild positive McMurray's.  Positive patellar grind  After informed written and verbal consent, patient was seated on exam table. Right knee was prepped with alcohol swab and utilizing anterolateral approach, patient's right knee space was injected with 4:1  marcaine 0.5%: Kenalog 40mg /dL. Patient tolerated the procedure well without immediate complications.   Impression and Recommendations:     This case required medical decision making of moderate complexity.      Note: This dictation was prepared with Dragon dictation along with smaller phrase technology. Any transcriptional errors that result from this process are unintentional.

## 2017-06-24 ENCOUNTER — Encounter: Payer: Self-pay | Admitting: Family Medicine

## 2017-06-24 ENCOUNTER — Ambulatory Visit: Payer: Self-pay | Admitting: Family Medicine

## 2017-06-24 DIAGNOSIS — M1711 Unilateral primary osteoarthritis, right knee: Secondary | ICD-10-CM

## 2017-06-24 NOTE — Assessment & Plan Note (Addendum)
Discussed icing regimen discussed bracing icing regimen, given topical anti-inflammatories.  Patient will continue with conservative therapy and as long as patient responds well to the injections follow-up again in 10 weeks.

## 2017-06-24 NOTE — Patient Instructions (Signed)
Good to see you  Injected the knee  You know the drill  Have fun surfing  See you again in 10 weeks

## 2017-09-04 ENCOUNTER — Other Ambulatory Visit: Payer: Self-pay | Admitting: Internal Medicine

## 2017-09-22 NOTE — Progress Notes (Signed)
Juan [name] - 62 y.o. male MRN [id number]  Date of birth: 05-10-1955  SUBJECTIVE:  Including CC & ROS.  Chief Complaint  Patient presents with  . Knee Pain    Juan [name] is a 62 y.o. male that is presenting with right knee pain. Pain has been chronic in nature, increasing over the month. He has received injections in the past, his last one was 06/24/17. He states the injections have helped. Denies warmth, redness or tenderness. Pain is located in the medial aspect of his right knee. He does yoga daily. Admits to swelling.  He applies ice to his knee, motrin, and compression. Denies injury or surgeries.   Independent review of the right knee x-ray from 2017 shows minimal degenerative changes.   Review of Systems  Constitutional: Negative for fever.  HENT: Negative for congestion.   Respiratory: Negative for cough.   Cardiovascular: Negative for chest pain.  Gastrointestinal: Negative for abdominal pain.  Musculoskeletal: Positive for arthralgias.  Skin: Negative for color change.  Neurological: Negative for weakness.  Hematological: Negative for adenopathy.  Psychiatric/Behavioral: Negative for agitation.    HISTORY: Past Medical, Surgical, Social, and Family History Reviewed & Updated per EMR.   Pertinent Historical Findings include:  Past Medical History:  Diagnosis Date  . Hypertension   . Personal history of colonic polyps - adenoma 04/24/2008   04/2008 - diminutive adenoma 06/13/2013      Past Surgical History:  Procedure Laterality Date  . APPENDECTOMY  2007   ruptured  . COLONOSCOPY      Allergies  Allergen Reactions  . Ace Inhibitors     REACTION: cough    Family History  Problem Relation Age of Onset  . Colon cancer Neg Hx   . Prostate cancer Father   . Heart disease Father      Social History   Socioeconomic History  . Marital status: Married    Spouse name: Not on file  . Number of children: Not on file  . Years of education: Not on file  .  Highest education level: Not on file  Occupational History  . Not on file  Social Needs  . Financial resource strain: Not on file  . Food insecurity:    Worry: Not on file    Inability: Not on file  . Transportation needs:    Medical: Not on file    Non-medical: Not on file  Tobacco Use  . Smoking status: Former Smoker    Last attempt to quit: 01/06/1999    Years since quitting: 18.7  . Smokeless tobacco: Never Used  Substance and Sexual Activity  . Alcohol use: No  . 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 Sexual activity: Yes  Lifestyle  . Physical activity:    Days per week: Not on file    Minutes per session: Not on file  . Stress: Not on file  Relationships  . Social connections:    Talks on phone: Not on file    Gets together: Not on file    Attends religious service: Not on file    Active member of club or organization: Not on file    Attends meetings of clubs or organizations: Not on file    Relationship status: Not on file  . Intimate partner violence:    Fear of current or ex partner: Not on file    Emotionally abused: Not on file    Physically  abused: Not on file    Forced sexual activity: Not on file  Other Topics Concern  . Not on file  Social History Narrative   Regular exercise-Yes   He did rehab 12 years ago and has screened several times for HIV and Hep A/B/C and always negative.     PHYSICAL EXAM:  VS: BP 122/72   Pulse 61   Ht 6' (1.829 m)   Wt 180 lb (81.6 kg)   SpO2 98%   BMI 24.41 kg/m  Physical Exam Gen: NAD, alert, cooperative with exam, well-appearing ENT: normal lips, normal nasal mucosa,  Eye: normal EOM, normal conjunctiva and lids CV:  no edema, +2 pedal pulses   Resp: no accessory muscle use, non-labored,   Skin: no rashes, no areas of induration  Neuro: normal tone, normal sensation to touch Psych:  normal insight, alert and oriented MSK:  Right knee:  No obvious effusion    Normal ROM  Normal strength to resistance  Valgus change  No pain with patella grind or compression  Negative McMurray's test  Neurovascularly intact   Aspiration/Injection Procedure Note Juan [name] [birth year]  Procedure: Injection Indications: right knee pain   Procedure Details Consent: Risks of procedure as well as the alternatives and risks of each were explained to the (patient/caregiver).  Consent for procedure obtained. Time Out: Verified patient identification, verified procedure, site/side was marked, verified correct patient position, special equipment/implants available, medications/allergies/relevent history reviewed, required imaging and test results available.  Performed.  The area was cleaned with iodine and alcohol swabs.    The left knee superior lateral suprapatellar pouch was injected using 1 cc's of 40 mg Depomedrol and 4 cc's of 1% lidocaine with a 25 1 1/2" needle.  Ultrasound was used. Images were obtained in Long views showing the injection.    A sterile dressing was applied.  Patient did tolerate procedure well.          ASSESSMENT & PLAN:   Degenerative arthritis of right knee Acute on chronic right knee pain.  Feels similar to his previous pain. -Injection today. -Could consider updating imaging -Discussed PRP and gel injections.  He will look into this more.

## 2017-09-23 ENCOUNTER — Encounter: Payer: Self-pay | Admitting: Family Medicine

## 2017-09-23 ENCOUNTER — Ambulatory Visit (INDEPENDENT_AMBULATORY_CARE_PROVIDER_SITE_OTHER): Payer: Self-pay | Admitting: Family Medicine

## 2017-09-23 DIAGNOSIS — M1711 Unilateral primary osteoarthritis, right knee: Secondary | ICD-10-CM

## 2017-09-23 NOTE — Assessment & Plan Note (Signed)
Acute on chronic right knee pain.  Feels similar to his previous pain. -Injection today. -Could consider updating imaging -Discussed PRP and gel injections.  He will look into this more.

## 2017-09-23 NOTE — Patient Instructions (Signed)
Good to see you  Take tylenol 650 mg three times a day is the best evidence based medicine we have for arthritis.  Glucosamine sulfate 750mg  twice a day is a supplement that has been shown to help moderate to severe arthritis. Vitamin D 2000 IU daily Fish oil 2 grams daily.  Tumeric 500mg  twice daily.  Capsaicin topically up to four times a day may also help with pain. You could try PRP if you would like

## 2017-10-08 ENCOUNTER — Ambulatory Visit: Payer: Self-pay

## 2017-10-08 NOTE — Telephone Encounter (Signed)
Patient called in with c/o "knee swelling." He says "I have this problem all the time and usually Dr. Tamala Julian will inject the knee and the swelling goes down. This time I saw Dr. Raeford Razor and he injected it different than Dr. Tamala Julian does and the swelling hasn't really gone down and the pain is what's concerning to me. It's my right knee and it is puffy around the top of my knee. I can sit riding in a car and when I get out, I am limping with pain at a 6." I asked about other symptoms, he denies. According to protocol, see PCP within 3 days. Patient says "I don't need to see Dr. Ronnald Ramp about this. I usually just see Dr. Tamala Julian." I advised no availability with Dr. Tamala Julian. Patient says "that's ok, I will go another route. I left a message for Dr. Tamala Julian, so I guess he will call me back on Monday. Thanks for your help." Patient verbalized understanding of care advice.   Reason for Disposition . Swollen knee joint (Exception: area of localized swelling which is itchy)  Answer Assessment - Initial Assessment Questions 1. LOCATION: "Where is the swelling located?"  (e.g., left, right, both knees)     Right knee 2. SIZE and DESCRIPTION: "What does the swelling look like?"  (e.g., entire knee, localized)     Not supper swollen, puffy around the top 3. ONSET: "When did the swelling start?" "Does it come and go, or is it there all the time?"     Since September, not gone down after injection 4. PAIN: "Is there any pain?" If so, ask: "How bad is it?" (Scale 1-10; or mild, moderate, severe)     6, limping when sitting a long time and painful 5. SETTING: "Has there been any recent work, exercise or other activity that involved that part of the body?"      No 6. AGGRAVATING FACTORS: "What makes the knee swelling worse?" (e.g., walking, climbing stairs, running)    Worse since I got the shot, no better 7. ASSOCIATED SYMPTOMS: "Is there any pain or redness?"     Pain 8. OTHER SYMPTOMS: "Do you have any other symptoms?"  (e.g., chest pain, difficulty breathing, fever, calf pain)     No 9. PREGNANCY: "Is there any chance you are pregnant?" "When was your last menstrual period?"     N/A  Protocols used: [facility]

## 2017-10-11 NOTE — Progress Notes (Signed)
[name] [address] [place], [place] [address] Phone: [phone] Subjective:   [name], am serving as a scribe for Dr. Hulan Saas.   CC: Right knee pain  [id number]  Juan [name] is a 62 y.o. male coming in with complaint of right knee pain. Injection was performed over the top of patella. Is having sharp pains on side of knee. Stiffness and weak since injection. Has not been able to be as active.  Patient has known arthritic changes of the right knee.  Have been waiting insurance to further do other treatments for the knee.  Patient likely has a meniscal tear with synovitis multiple times.     Past Medical History:  Diagnosis Date  . Hypertension   . Personal history of colonic polyps - adenoma 04/24/2008   04/2008 - diminutive adenoma 06/13/2013     Past Surgical History:  Procedure Laterality Date  . APPENDECTOMY  2007   ruptured  . COLONOSCOPY     Social History   Socioeconomic History  . Marital status: Married    Spouse name: Not on file  . Number of children: Not on file  . Years of education: Not on file  . Highest education level: Not on file  Occupational History  . Not on file  Social Needs  . Financial resource strain: Not on file  . Food insecurity:    Worry: Not on file    Inability: Not on file  . Transportation needs:    Medical: Not on file    Non-medical: Not on file  Tobacco Use  . Smoking status: Former Smoker    Last attempt to quit: 01/06/1999    Years since quitting: 18.7  . Smokeless tobacco: Never Used  Substance and Sexual Activity  . Alcohol use: No  . 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 Sexual activity: Yes  Lifestyle  . Physical activity:    Days per week: Not on file    Minutes per session: Not on file  . Stress: Not on file  Relationships  . Social connections:    Talks on phone: Not on file    Gets together: Not on file   Attends religious service: Not on file    Active member of club or organization: Not on file    Attends meetings of clubs or organizations: Not on file    Relationship status: Not on file  Other Topics Concern  . Not on file  Social History Narrative   Regular exercise-Yes   He did rehab 12 years ago and has screened several times for HIV and Hep A/B/C and always negative.   Allergies  Allergen Reactions  . Ace Inhibitors     REACTION: cough   Family History  Problem Relation Age of Onset  . Colon cancer Neg Hx   . Prostate cancer Father   . Heart disease Father     Current Outpatient Medications (Endocrine & Metabolic):  .  testosterone cypionate (DEPOTESTOSTERONE CYPIONATE) 200 MG/ML injection, INJECT 1ML INTO THE MUSCLE EVERY 14 DAYS  Current Outpatient Medications (Cardiovascular):  .  atorvastatin (LIPITOR) 20 MG tablet, Take 1 tablet (20 mg total) by mouth daily. Marland Kitchen  losartan-hydrochlorothiazide (HYZAAR) 50-12.5 MG tablet, Take 1 tablet by mouth daily. .  tadalafil (CIALIS) 5 MG tablet, Take 1 tablet (5 mg total) by mouth daily as needed for erectile dysfunction.   Current Outpatient  Medications (Analgesics):  .  aspirin 81 MG tablet, Take 81 mg by mouth daily.     Current Outpatient Medications (Other):  Marland Kitchen  B-D 3CC LUER-LOK SYR 23GX1" 23G X 1" 3 ML MISC,  .  BD DISP NEEDLES 18G X 1-1/2" MISC,  .  Diclofenac Sodium (PENNSAID) 2 % SOLN, Place 2 application onto the skin 2 (two) times daily. .  fish oil-omega-3 fatty acids 1000 MG capsule, Take 2 g by mouth daily.   .  Multiple Vitamin (MULTIVITAMIN PO), Take by mouth daily.   Marland Kitchen  terbinafine (LAMISIL) 250 MG tablet, Take 1 tablet (250 mg total) by mouth daily. .  traZODone (DESYREL) 50 MG tablet, TAKE 1 TO 2 TABLETS BY MOUTH AT BEDTIME IF NEEDED    Past medical history, social, surgical and family history all reviewed in electronic medical record.  No pertanent information unless stated regarding to the chief  complaint.   Review of Systems:  No headache, visual changes, nausea, vomiting, diarrhea, constipation, dizziness, abdominal pain, skin rash, fevers, chills, night sweats, weight loss, swollen lymph nodes, body aches, chest pain, shortness of breath, mood changes.  Positive muscle aches and joint swelling  Objective  Blood pressure 118/80, pulse 67, height 6' (1.829 m), weight 180 lb (81.6 kg), SpO2 98 %.    General: No apparent distress alert and oriented x3 mood and affect normal, dressed appropriately.  HEENT: Pupils equal, extraocular movements intact  Respiratory: Patient's speak in full sentences and does not appear short of breath  Cardiovascular: No lower extremity edema, non tender, no erythema  Skin: Warm dry intact with no signs of infection or rash on extremities or on axial skeleton.  Abdomen: Soft nontender  Neuro: Cranial nerves II through XII are intact, neurovascularly intact in all extremities with 2+ DTRs and 2+ pulses.  Lymph: No lymphadenopathy of posterior or anterior cervical chain or axillae bilaterally.  Gait antalgic MSK:  Non tender with full range of motion and good stability and symmetric strength and tone of shoulders, elbows, wrist, hip, and ankles bilaterally.  Knee: Right valgus deformity noted.  Abnormal thigh to calf ratio.  No effusion noted Tender to palpation over medial and PF joint line.  ROM full in flexion and extension and lower leg rotation. instability with valgus force.  painful patellar compression. Patellar glide with moderate crepitus. Patellar and quadriceps tendons unremarkable. Hamstring and quadriceps strength is normal. Contralateral knee shows minimal arthritic changes  After informed written and verbal consent, patient was seated on exam table. Right knee was prepped with alcohol swab and utilizing anterolateral approach, patient's right knee space was injected with 4:1  marcaine 0.5%: Kenalog 40mg /dL. Patient tolerated the  procedure well without immediate complications.     Impression and Recommendations:     This case required medical decision making of moderate complexity. The above documentation has been reviewed and is accurate and complete Lyndal Pulley, DO       Note: This dictation was prepared with Dragon dictation along with smaller phrase technology. Any transcriptional errors that result from this process are unintentional.

## 2017-10-11 NOTE — Telephone Encounter (Signed)
Schedule patient this week.

## 2017-10-13 ENCOUNTER — Ambulatory Visit: Payer: Self-pay

## 2017-10-13 ENCOUNTER — Encounter: Payer: Self-pay | Admitting: Family Medicine

## 2017-10-13 ENCOUNTER — Ambulatory Visit (INDEPENDENT_AMBULATORY_CARE_PROVIDER_SITE_OTHER): Payer: Self-pay | Admitting: Family Medicine

## 2017-10-13 VITALS — BP 118/80 | HR 67 | Ht 72.0 in | Wt 180.0 lb

## 2017-10-13 DIAGNOSIS — M1711 Unilateral primary osteoarthritis, right knee: Secondary | ICD-10-CM

## 2017-10-13 DIAGNOSIS — M25561 Pain in right knee: Secondary | ICD-10-CM

## 2017-10-13 DIAGNOSIS — G8929 Other chronic pain: Secondary | ICD-10-CM

## 2017-10-13 NOTE — Patient Instructions (Addendum)
Good to see you  Ice is your friend Stay active Compression sleeve could be good.  See me again in 3 weeks Call [phone] when you get the insurance information and then we will get approval for orthovisc.

## 2017-10-13 NOTE — Assessment & Plan Note (Signed)
Degenerative knee arthritis.  Discussed with patient in great length.  Another injection given today.  Possible advanced imaging may be warranted.  I do believe the patient could be a candidate for Visco supplementation and will consider prior approval and patient does have insurance.  Discussed icing regimen.  Follow-up again in 3 to 4 weeks

## 2017-11-29 ENCOUNTER — Other Ambulatory Visit: Payer: Self-pay | Admitting: Internal Medicine

## 2017-12-25 ENCOUNTER — Other Ambulatory Visit: Payer: Self-pay | Admitting: Internal Medicine

## 2017-12-25 DIAGNOSIS — E291 Testicular hypofunction: Secondary | ICD-10-CM

## 2017-12-25 DIAGNOSIS — E785 Hyperlipidemia, unspecified: Secondary | ICD-10-CM

## 2018-01-10 NOTE — Progress Notes (Signed)
[name] [address], [place] [address] Phone: [phone] Subjective:   [name], am serving as a scribe for Dr. Hulan Saas.   CC: Right knee pain  [id number]  Juan [name] is a 63 y.o. male coming in with complaint of right knee pain. Is having small amount of pain on medial aspect of knee. Would like for injection in knee today as his pain has been increasing. Feels a tightness in his knee.  Known arthritic changes.  Does have some instability.  Was considering the Visco supplementation but now does have new insurance and overall is doing relatively okay.     Past Medical History:  Diagnosis Date  . Hypertension   . Personal history of colonic polyps - adenoma 04/24/2008   04/2008 - diminutive adenoma 06/13/2013     Past Surgical History:  Procedure Laterality Date  . APPENDECTOMY  2007   ruptured  . COLONOSCOPY     Social History   Socioeconomic History  . Marital status: Married    Spouse name: Not on file  . Number of children: Not on file  . Years of education: Not on file  . Highest education level: Not on file  Occupational History  . Not on file  Social Needs  . Financial resource strain: Not on file  . Food insecurity:    Worry: Not on file    Inability: Not on file  . Transportation needs:    Medical: Not on file    Non-medical: Not on file  Tobacco Use  . Smoking status: Former Smoker    Last attempt to quit: 01/06/1999    Years since quitting: 19.0  . Smokeless tobacco: Never Used  Substance and Sexual Activity  . Alcohol use: No  . 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 Sexual activity: Yes  Lifestyle  . Physical activity:    Days per week: Not on file    Minutes per session: Not on file  . Stress: Not on file  Relationships  . Social connections:    Talks on phone: Not on file    Gets together: Not on file    Attends religious service: Not  on file    Active member of club or organization: Not on file    Attends meetings of clubs or organizations: Not on file    Relationship status: Not on file  Other Topics Concern  . Not on file  Social History Narrative   Regular exercise-Yes   He did rehab 12 years ago and has screened several times for HIV and Hep A/B/C and always negative.   Allergies  Allergen Reactions  . Ace Inhibitors     REACTION: cough   Family History  Problem Relation Age of Onset  . Colon cancer Neg Hx   . Prostate cancer Father   . Heart disease Father     Current Outpatient Medications (Endocrine & Metabolic):  .  testosterone cypionate (DEPOTESTOSTERONE CYPIONATE) 200 MG/ML injection, INJECT 1 ML IN THE MUSCLE EVERY 14 DAYS  Current Outpatient Medications (Cardiovascular):  .  atorvastatin (LIPITOR) 20 MG tablet, TAKE 1 TABLET BY MOUTH EVERY DAY .  losartan-hydrochlorothiazide (HYZAAR) 50-12.5 MG tablet, Take 1 tablet by mouth daily. .  tadalafil (CIALIS) 5 MG tablet, Take 1 tablet (5 mg total) by mouth daily as needed for erectile dysfunction.   Current Outpatient Medications (Analgesics):  .  aspirin 81 MG tablet, Take 81 mg by mouth daily.     Current Outpatient Medications (Other):  Marland Kitchen  B-D 3CC LUER-LOK SYR 23GX1" 23G X 1" 3 ML MISC,  .  BD DISP NEEDLES 18G X 1-1/2" MISC,  .  Diclofenac Sodium (PENNSAID) 2 % SOLN, Place 2 application onto the skin 2 (two) times daily. .  fish oil-omega-3 fatty acids 1000 MG capsule, Take 2 g by mouth daily.   .  Multiple Vitamin (MULTIVITAMIN PO), Take by mouth daily.   Marland Kitchen  terbinafine (LAMISIL) 250 MG tablet, Take 1 tablet (250 mg total) by mouth daily. .  traZODone (DESYREL) 50 MG tablet, TAKE 1 TO 2 TABLETS BY MOUTH AT BEDTIME IF NEEDED    Past medical history, social, surgical and family history all reviewed in electronic medical record.  No pertanent information unless stated regarding to the chief complaint.   Review of Systems:  No headache,  visual changes, nausea, vomiting, diarrhea, constipation, dizziness, abdominal pain, skin rash, fevers, chills, night sweats, weight loss, swollen lymph nodes, body aches, joint swelling,  chest pain, shortness of breath, mood changes.  Positive muscle aches  Objective  Blood pressure 118/86, pulse 69, height 6' (1.829 m), weight 180 lb (81.6 kg), SpO2 96 %.    General: No apparent distress alert and oriented x3 mood and affect normal, dressed appropriately.  HEENT: Pupils equal, extraocular movements intact  Respiratory: Patient's speak in full sentences and does not appear short of breath  Cardiovascular: No lower extremity edema, non tender, no erythema  Skin: Warm dry intact with no signs of infection or rash on extremities or on axial skeleton.  Abdomen: Soft nontender  Neuro: Cranial nerves II through XII are intact, neurovascularly intact in all extremities with 2+ DTRs and 2+ pulses.  Lymph: No lymphadenopathy of posterior or anterior cervical chain or axillae bilaterally.  Gait mild antalgic MSK:  Non tender with full range of motion and good stability and symmetric strength and tone of shoulders, elbows, wrist, hip, and ankles bilaterally.  Knee: Right valgus deformity noted.  Abnormal thigh to calf ratio.  Tender to palpation over medial and PF joint line.  ROM full in flexion and extension and lower leg rotation. instability with valgus force.  painful patellar compression. Patellar glide with moderate crepitus. Patellar and quadriceps tendons unremarkable. Hamstring and quadriceps strength is normal. Contralateral knee shows minimal arthritic changes  After informed written and verbal consent, patient was seated on exam table. Right knee was prepped with alcohol swab and utilizing anterolateral approach, patient's right knee space was injected with 4:1  marcaine 0.5%: Kenalog 40mg /dL. Patient tolerated the procedure well without immediate complications.   Impression and  Recommendations:      The above documentation has been reviewed and is accurate and complete Lyndal Pulley, DO       Note: This dictation was prepared with Dragon dictation along with smaller phrase technology. Any transcriptional errors that result from this process are unintentional.

## 2018-01-11 ENCOUNTER — Encounter: Payer: Self-pay | Admitting: Family Medicine

## 2018-01-11 ENCOUNTER — Ambulatory Visit: Payer: PRIVATE HEALTH INSURANCE | Admitting: Family Medicine

## 2018-01-11 DIAGNOSIS — M1711 Unilateral primary osteoarthritis, right knee: Secondary | ICD-10-CM | POA: Diagnosis not present

## 2018-01-11 NOTE — Patient Instructions (Signed)
Good to see you  Another steroid injection today  Ice is your friend  Will get approval again just in case and then aask your boss the deductible Have an appointment set up in 6-8 weeks just in case otherwise can repeat steroid injection every 3 months

## 2018-01-11 NOTE — Assessment & Plan Note (Signed)
Injection given today.  Discussed home exercise, icing regimen, which activities of doing which wants to avoid.  Patient is to increase activity as tolerated.  Follow-up again 6 weeks.  Could be candidate for Visco supplementation and will try to get prior approval.

## 2018-02-03 ENCOUNTER — Other Ambulatory Visit: Payer: Self-pay | Admitting: Internal Medicine

## 2018-02-04 NOTE — Telephone Encounter (Signed)
Pt states he was unaware he had to fu in July. Patient scheduled for 2/24 and asking for refill until then

## 2018-02-04 NOTE — Telephone Encounter (Signed)
Patient was to follow up in July.   Can you call patient and see if he can come in for an appointment.

## 2018-02-05 ENCOUNTER — Other Ambulatory Visit: Payer: Self-pay | Admitting: Internal Medicine

## 2018-02-05 DIAGNOSIS — F5105 Insomnia due to other mental disorder: Secondary | ICD-10-CM

## 2018-02-05 DIAGNOSIS — I10 Essential (primary) hypertension: Secondary | ICD-10-CM

## 2018-02-05 DIAGNOSIS — F409 Phobic anxiety disorder, unspecified: Secondary | ICD-10-CM

## 2018-02-05 MED ORDER — LOSARTAN POTASSIUM-HCTZ 50-12.5 MG PO TABS: 1.0000 | ORAL_TABLET | Freq: Every day | ORAL | 0 refills | Status: DC

## 2018-02-05 MED ORDER — TRAZODONE HCL 50 MG PO TABS: 100.0000 mg | ORAL_TABLET | Freq: Every day | ORAL | 0 refills | Status: DC

## 2018-02-09 ENCOUNTER — Other Ambulatory Visit: Payer: Self-pay

## 2018-02-09 DIAGNOSIS — E785 Hyperlipidemia, unspecified: Secondary | ICD-10-CM

## 2018-02-09 MED ORDER — ATORVASTATIN CALCIUM 20 MG PO TABS: 20.0000 mg | ORAL_TABLET | Freq: Every day | ORAL | 0 refills | Status: DC

## 2018-02-28 ENCOUNTER — Other Ambulatory Visit (INDEPENDENT_AMBULATORY_CARE_PROVIDER_SITE_OTHER): Payer: PRIVATE HEALTH INSURANCE

## 2018-02-28 ENCOUNTER — Ambulatory Visit: Payer: PRIVATE HEALTH INSURANCE | Admitting: Internal Medicine

## 2018-02-28 ENCOUNTER — Encounter: Payer: Self-pay | Admitting: Internal Medicine

## 2018-02-28 VITALS — BP 128/80 | HR 63 | Temp 98.3°F | Resp 16 | Ht 72.0 in | Wt 183.5 lb

## 2018-02-28 DIAGNOSIS — E781 Pure hyperglyceridemia: Secondary | ICD-10-CM

## 2018-02-28 DIAGNOSIS — I10 Essential (primary) hypertension: Secondary | ICD-10-CM

## 2018-02-28 DIAGNOSIS — F320 Major depressive disorder, single episode, mild: Secondary | ICD-10-CM | POA: Insufficient documentation

## 2018-02-28 DIAGNOSIS — Z125 Encounter for screening for malignant neoplasm of prostate: Secondary | ICD-10-CM | POA: Diagnosis not present

## 2018-02-28 DIAGNOSIS — Z0000 Encounter for general adult medical examination without abnormal findings: Secondary | ICD-10-CM | POA: Diagnosis not present

## 2018-02-28 DIAGNOSIS — N528 Other male erectile dysfunction: Secondary | ICD-10-CM

## 2018-02-28 DIAGNOSIS — E291 Testicular hypofunction: Secondary | ICD-10-CM | POA: Diagnosis not present

## 2018-02-28 DIAGNOSIS — E785 Hyperlipidemia, unspecified: Secondary | ICD-10-CM

## 2018-02-28 DIAGNOSIS — Z23 Encounter for immunization: Secondary | ICD-10-CM

## 2018-02-28 DIAGNOSIS — M1711 Unilateral primary osteoarthritis, right knee: Secondary | ICD-10-CM

## 2018-02-28 LAB — CBC WITH DIFFERENTIAL/PLATELET
Basophils Absolute: 0.1 10*3/uL (ref 0.0–0.1)
Basophils Relative: 1.2 % (ref 0.0–3.0)
Eosinophils Absolute: 0.2 10*3/uL (ref 0.0–0.7)
Eosinophils Relative: 2.9 % (ref 0.0–5.0)
HEMATOCRIT: 42.6 % (ref 39.0–52.0)
Hemoglobin: 14.6 g/dL (ref 13.0–17.0)
LYMPHS PCT: 14.9 % (ref 12.0–46.0)
Lymphs Abs: 0.9 10*3/uL (ref 0.7–4.0)
MCHC: 34.4 g/dL (ref 30.0–36.0)
MCV: 88.1 fl (ref 78.0–100.0)
Monocytes Absolute: 1 10*3/uL (ref 0.1–1.0)
Monocytes Relative: 16.1 % — ABNORMAL HIGH (ref 3.0–12.0)
Neutro Abs: 3.9 10*3/uL (ref 1.4–7.7)
Neutrophils Relative %: 64.9 % (ref 43.0–77.0)
Platelets: 392 10*3/uL (ref 150.0–400.0)
RBC: 4.83 Mil/uL (ref 4.22–5.81)
RDW: 13.7 % (ref 11.5–15.5)
WBC: 6 10*3/uL (ref 4.0–10.5)

## 2018-02-28 LAB — COMPREHENSIVE METABOLIC PANEL
ALT: 37 U/L (ref 0–53)
AST: 23 U/L (ref 0–37)
Albumin: 4.5 g/dL (ref 3.5–5.2)
Alkaline Phosphatase: 30 U/L — ABNORMAL LOW (ref 39–117)
BUN: 18 mg/dL (ref 6–23)
CO2: 25 meq/L (ref 19–32)
Calcium: 9.3 mg/dL (ref 8.4–10.5)
Chloride: 105 mEq/L (ref 96–112)
Creatinine, Ser: 0.88 mg/dL (ref 0.40–1.50)
GFR: 87.51 mL/min (ref >60.00)
Glucose, Bld: 88 mg/dL (ref 70–99)
Potassium: 3.9 mEq/L (ref 3.5–5.1)
SODIUM: 139 meq/L (ref 135–145)
Total Bilirubin: 0.3 mg/dL (ref 0.2–1.2)
Total Protein: 6.9 g/dL (ref 6.0–8.3)

## 2018-02-28 LAB — LIPID PANEL
Cholesterol: 165 mg/dL (ref 0–200)
HDL: 52.8 mg/dL (ref >39.00)
NONHDL: 112.43
Total CHOL/HDL Ratio: 3
Triglycerides: 227 mg/dL — ABNORMAL HIGH (ref 0.0–149.0)
VLDL: 45.4 mg/dL — ABNORMAL HIGH (ref 0.0–40.0)

## 2018-02-28 LAB — TSH: TSH: 1.37 u[IU]/mL (ref 0.35–4.50)

## 2018-02-28 LAB — LDL CHOLESTEROL, DIRECT: Direct LDL: 98 mg/dL

## 2018-02-28 LAB — PSA: PSA: 0.24 ng/mL (ref 0.10–4.00)

## 2018-02-28 MED ORDER — TADALAFIL 5 MG PO TABS: 5.0000 mg | ORAL_TABLET | Freq: Every day | ORAL | 1 refills | Status: DC | PRN

## 2018-02-28 MED ORDER — DULOXETINE HCL 30 MG PO CPEP: 30.0000 mg | ORAL_CAPSULE | Freq: Every day | ORAL | 0 refills | Status: DC

## 2018-02-28 MED ORDER — ZOSTER VAC RECOMB ADJUVANTED 50 MCG/0.5ML IM SUSR: 0.5000 mL | Freq: Once | INTRAMUSCULAR | 1 refills | Status: AC

## 2018-02-28 NOTE — Progress Notes (Signed)
Subjective:  Patient ID: Juan [name], male    DOB: [birth year]  Age: 63 y.o. MRN: [id number]  CC: Annual Exam; Hypertension; Hyperlipidemia; and Depression   HPI Juan [name] presents for a CPX.  He complains of a several month history of anhedonia, irritability, wanting to isolate, and fatigue.  Outpatient Medications Prior to Visit  Medication Sig Dispense Refill  . aspirin 81 MG tablet Take 81 mg by mouth daily.      Marland Kitchen atorvastatin (LIPITOR) 20 MG tablet Take 1 tablet (20 mg total) by mouth daily. 30 tablet 0  . B-D 3CC LUER-LOK SYR 23GX1" 23G X 1" 3 ML MISC   0  . BD DISP NEEDLES 18G X 1-1/2" MISC   0  . fish oil-omega-3 fatty acids 1000 MG capsule Take 2 g by mouth daily.      Marland Kitchen losartan-hydrochlorothiazide (HYZAAR) 50-12.5 MG tablet Take 1 tablet by mouth daily. 30 tablet 0  . testosterone cypionate (DEPOTESTOSTERONE CYPIONATE) 200 MG/ML injection INJECT 1 ML IN THE MUSCLE EVERY 14 DAYS 10 mL 0  . traZODone (DESYREL) 50 MG tablet Take 2 tablets (100 mg total) by mouth at bedtime. 180 tablet 0  . Multiple Vitamin (MULTIVITAMIN PO) Take by mouth daily.      . Diclofenac Sodium (PENNSAID) 2 % SOLN Place 2 application onto the skin 2 (two) times daily. 112 g 3  . tadalafil (CIALIS) 5 MG tablet Take 1 tablet (5 mg total) by mouth daily as needed for erectile dysfunction. 90 tablet 3   No facility-administered medications prior to visit.     ROS Review of Systems  Constitutional: Positive for fatigue. Negative for appetite change, diaphoresis, fever and unexpected weight change.  HENT: Negative.  Negative for trouble swallowing.   Eyes: Negative for visual disturbance.  Respiratory: Negative for cough, chest tightness, shortness of breath and wheezing.   Cardiovascular: Negative for chest pain, palpitations and leg swelling.  Gastrointestinal: Negative for abdominal pain, constipation, diarrhea, nausea and vomiting.  Genitourinary: Negative for difficulty urinating, dysuria,  penile swelling, scrotal swelling, testicular pain and urgency.       + ED  Musculoskeletal: Positive for arthralgias. Negative for back pain, myalgias and neck pain.  Skin: Negative.  Negative for pallor.  Neurological: Negative.  Negative for dizziness, weakness, light-headedness and headaches.  Hematological: Negative for adenopathy. Does not bruise/bleed easily.  Psychiatric/Behavioral: Positive for dysphoric mood and sleep disturbance. Negative for agitation, behavioral problems, self-injury and suicidal ideas. The patient is not nervous/anxious and is not hyperactive.     Objective:  BP 128/80 (BP Location: Left Arm, Patient Position: Sitting, Cuff Size: Normal)   Pulse 63   Temp 98.3 F (36.8 C) (Oral)   Resp 16   Ht 6' (1.829 m)   Wt 183 lb 8 oz (83.2 kg)   SpO2 98%   BMI 24.89 kg/m   BP Readings from Last 3 Encounters:  02/28/18 128/80  01/11/18 118/86  10/13/17 118/80    Wt Readings from Last 3 Encounters:  02/28/18 183 lb 8 oz (83.2 kg)  01/11/18 180 lb (81.6 kg)  10/13/17 180 lb (81.6 kg)    Physical Exam Constitutional:      Appearance: He is not ill-appearing or diaphoretic.  HENT:     Nose: Nose normal. No congestion.     Mouth/Throat:     Mouth: Mucous membranes are moist.     Pharynx: Oropharynx is clear. No oropharyngeal exudate or posterior oropharyngeal erythema.  Eyes:  General: No scleral icterus.    Conjunctiva/sclera: Conjunctivae normal.  Neck:     Musculoskeletal: Normal range of motion and neck supple.  Cardiovascular:     Rate and Rhythm: Normal rate and regular rhythm.     Heart sounds: No murmur. No gallop.   Pulmonary:     Effort: Pulmonary effort is normal. No respiratory distress.     Breath sounds: No stridor. No wheezing, rhonchi or rales.  Abdominal:     General: Bowel sounds are normal.     Palpations: There is no hepatomegaly, splenomegaly or mass.     Tenderness: There is no abdominal tenderness.     Hernia: There is no  hernia in the right inguinal area or left inguinal area.  Genitourinary:    Pubic Area: No rash.      Penis: Normal and circumcised. No discharge, swelling or lesions.      Scrotum/Testes: Normal.        Right: Mass or tenderness not present.        Left: Mass or tenderness not present.     Epididymis:     Right: Normal. Not inflamed.     Left: Normal. Not inflamed.     Prostate: Normal. Not enlarged, not tender and no nodules present.     Rectum: Normal. Guaiac result negative. No mass, tenderness, anal fissure, external hemorrhoid or internal hemorrhoid. Normal anal tone.  Musculoskeletal: Normal range of motion.        General: No swelling.     Right lower leg: No edema.     Left lower leg: No edema.  Lymphadenopathy:     Lower Body: No right inguinal adenopathy. No left inguinal adenopathy.  Skin:    General: Skin is warm and dry.     Coloration: Skin is not pale.     Findings: No erythema or rash.  Neurological:     General: No focal deficit present.     Mental Status: He is oriented to person, place, and time. Mental status is at baseline.  Psychiatric:        Attention and Perception: Attention normal. He is attentive.        Mood and Affect: Mood normal. Affect is flat.        Speech: Speech normal. Speech is not rapid and pressured, delayed, slurred or tangential.        Behavior: Behavior normal. Behavior is cooperative.        Thought Content: Thought content normal.        Cognition and Memory: Cognition normal.        Judgment: Judgment normal.     Lab Results  Component Value Date   WBC 6.0 02/28/2018   HGB 14.6 02/28/2018   HCT 42.6 02/28/2018   PLT 392.0 02/28/2018   GLUCOSE 88 02/28/2018   CHOL 165 02/28/2018   TRIG 227.0 (H) 02/28/2018   HDL 52.80 02/28/2018   LDLDIRECT 98.0 02/28/2018   LDLCALC 114 (H) 12/21/2016   ALT 37 02/28/2018   AST 23 02/28/2018   NA 139 02/28/2018   K 3.9 02/28/2018   CL 105 02/28/2018   CREATININE 0.88 02/28/2018   BUN  18 02/28/2018   CO2 25 02/28/2018   TSH 1.37 02/28/2018   PSA 0.24 02/28/2018   HGBA1C 5.3 07/13/2012    Dg Knee 4 Views W/patella Right  Result Date: 10/10/2015 CLINICAL DATA:  Pain and swelling after hiking EXAM: RIGHT KNEE - COMPLETE 4+ VIEW COMPARISON:  None. FINDINGS: Weightbearing  frontal, weight-bearing tunnel, weight-bearing lateral, and sunrise patellar images were obtained. There is no fracture or dislocation. There is a focal joint effusion. There is mild joint space narrowing medially. Other joint spaces appear unremarkable. No erosive change. IMPRESSION: Joint space narrowing medially. Joint effusion present. No acute fracture or dislocation. Electronically Signed   By: Lowella Grip III M.D.   On: 10/10/2015 14:16    Assessment & Plan:   Dejaun was seen today for annual exam, hypertension, hyperlipidemia and depression.  Diagnoses and all orders for this visit:  Essential hypertension- His BP is adequately well controlled.  Electrolytes and renal function are normal. -     CBC with Differential/Platelet; Future -     Comprehensive metabolic panel; Future -     TSH; Future  HYPERTRIGLYCERIDEMIA- His triglycerides are mildly elevated but do not need to be treated.  Hyperlipidemia LDL goal <130- He has achieved his LDL goal and is doing well on the statin. -     TSH; Future  Hypogonadism male- No complications related to testosterone replacement therapy noted.  Routine general medical examination at a health care facility- Exam completed, labs reviewed, vaccines reviewed and updated, screening for colon cancer is up-to-date, patient education material was given. -     Lipid panel; Future -     PSA; Future  Need for influenza vaccination -     Flu Vaccine QUAD 36+ mos IM  Current mild episode of major depressive disorder without prior episode ([place])- Will start duloxetine at 30 mg a day.  If he tolerates this well then in about 3 to 4 weeks I will increase the dose to  60 mg a day to try to get resolution of his depressive symptoms. -     DULoxetine (CYMBALTA) 30 MG capsule; Take 1 capsule (30 mg total) by mouth daily.  Primary osteoarthritis of right knee- I am hopeful that 60 mg of daily duloxetine will help him manage the pain. -     DULoxetine (CYMBALTA) 30 MG capsule; Take 1 capsule (30 mg total) by mouth daily.  Need for shingles vaccine -     Zoster Vaccine Adjuvanted [facility]) injection; Inject 0.5 mLs into the muscle once for 1 dose.  Other male erectile dysfunction -     tadalafil (CIALIS) 5 MG tablet; Take 1 tablet (5 mg total) by mouth daily as needed for erectile dysfunction.   I have discontinued Elpidio Galea Multiple Vitamin (MULTIVITAMIN PO) and Diclofenac Sodium. I am also having him start on DULoxetine and Zoster Vaccine Adjuvanted. Additionally, I am having him maintain his fish oil-omega-3 fatty acids, aspirin, BD DISP NEEDLES, B-D 3CC LUER-LOK SYR 23GX1", testosterone cypionate, losartan-hydrochlorothiazide, traZODone, atorvastatin, and tadalafil.  Meds ordered this encounter  Medications  . DULoxetine (CYMBALTA) 30 MG capsule    Sig: Take 1 capsule (30 mg total) by mouth daily.    Dispense:  30 capsule    Refill:  0  . Zoster Vaccine Adjuvanted [facility]) injection    Sig: Inject 0.5 mLs into the muscle once for 1 dose.    Dispense:  0.5 mL    Refill:  1  . tadalafil (CIALIS) 5 MG tablet    Sig: Take 1 tablet (5 mg total) by mouth daily as needed for erectile dysfunction.    Dispense:  90 tablet    Refill:  1     Follow-up: Return in about 6 months (around 08/29/2018).  Scarlette Calico, MD

## 2018-02-28 NOTE — Patient Instructions (Signed)

## 2018-03-01 ENCOUNTER — Encounter: Payer: Self-pay | Admitting: Internal Medicine

## 2018-03-07 NOTE — Progress Notes (Signed)
[name] [address], [place] [address] Phone: [phone] Subjective:   Juan [name], am serving as a scribe for Dr. Hulan Saas.   CC: Bilateral knee pain  WGY:[id number]     Update 03/08/2018: Juan [name] is a 63 y.o. male coming in with complaint of right knee pain. Patient states that his knee pain has increased over the past week. Is having sharp pain over medial joint line. Otherwise does have constant dull ache. Did have a pocked of swelling lateral to the suprapatellar bursae that he drained himself with his testosterone needles. Did notice an improvement in his pain following aspiration.  Patient continues to have pain overall.  Attempted to get some improvement of the viscosupplementation but has not come through yet.  Patient is failed all other conservative therapy including formal physical therapy, home exercises, as well as steroid injections previously.  Pain was severe enough that is stopping him from daily activities    Past Medical History:  Diagnosis Date  . Hypertension   . Personal history of colonic polyps - adenoma 04/24/2008   04/2008 - diminutive adenoma 06/13/2013     Past Surgical History:  Procedure Laterality Date  . APPENDECTOMY  2007   ruptured  . COLONOSCOPY     Social History   Socioeconomic History  . Marital status: Married    Spouse name: Not on file  . Number of children: Not on file  . Years of education: Not on file  . Highest education level: Not on file  Occupational History  . Not on file  Social Needs  . Financial resource strain: Not on file  . Food insecurity:    Worry: Not on file    Inability: Not on file  . Transportation needs:    Medical: Not on file    Non-medical: Not on file  Tobacco Use  . Smoking status: Former Smoker    Last attempt to quit: 01/06/1999    Years since quitting: 19.1  . Smokeless tobacco: Never Used  Substance and Sexual Activity  . Alcohol use: [name]  .  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 Sexual activity: Yes  Lifestyle  . Physical activity:    Days per week: Not on file    Minutes per session: Not on file  . Stress: Not on file  Relationships  . Social connections:    Talks on phone: Not on file    Gets together: Not on file    Attends religious service: Not on file    Active member of club or organization: Not on file    Attends meetings of clubs or organizations: Not on file    Relationship status: Not on file  Other Topics Concern  . Not on file  Social History Narrative   Regular exercise-Yes   He did rehab 12 years ago and has screened several times for HIV and Hep A/B/C and always negative.   Allergies  Allergen Reactions  . Ace Inhibitors     REACTION: cough   Family History  Problem Relation Age of Onset  . Colon cancer Neg Hx   . Prostate cancer Father   . Heart disease Father     Current Outpatient Medications (Endocrine & Metabolic):  .  testosterone cypionate (DEPOTESTOSTERONE CYPIONATE) 200 MG/ML injection, INJECT 1 ML IN THE MUSCLE EVERY 14 DAYS  Current Outpatient Medications (Cardiovascular):  .  atorvastatin (LIPITOR) 20 MG tablet, Take 1 tablet (20 mg total) by mouth daily. Marland Kitchen  losartan-hydrochlorothiazide (HYZAAR) 50-12.5 MG tablet, Take 1 tablet by mouth daily. .  tadalafil (CIALIS) 5 MG tablet, Take 1 tablet (5 mg total) by mouth daily as needed for erectile dysfunction.   Current Outpatient Medications (Analgesics):  .  aspirin 81 MG tablet, Take 81 mg by mouth daily.     Current Outpatient Medications (Other):  Marland Kitchen  B-D 3CC LUER-LOK SYR 23GX1" 23G X 1" 3 ML MISC,  .  BD DISP NEEDLES 18G X 1-1/2" MISC,  .  DULoxetine (CYMBALTA) 30 MG capsule, Take 1 capsule (30 mg total) by mouth daily. .  fish oil-omega-3 fatty acids 1000 MG capsule, Take 2 g by mouth daily.   .  traZODone (DESYREL) 50 MG tablet, Take 2 tablets (100 mg total) by mouth at  bedtime.    Past medical history, social, surgical and family history all reviewed in electronic medical record.  [name] pertanent information unless stated regarding to the chief complaint.   Review of Systems:  [name] headache, visual changes, nausea, vomiting, diarrhea, constipation, dizziness, abdominal pain, skin rash, fevers, chills, night sweats, weight loss, swollen lymph nodes, body aches, joint swelling, chest pain, shortness of breath, mood changes.  Positive muscle aches   Objective  Blood pressure (!) 138/100, pulse 79, height 6' (1.829 m), weight 180 lb (81.6 kg), SpO2 96 %.   General: [name] apparent distress alert and oriented x3 mood and affect normal, dressed appropriately.  HEENT: Pupils equal, extraocular movements intact  Respiratory: Patient's speak in full sentences and does not appear short of breath  Cardiovascular: [name] lower extremity edema, non tender, [name] erythema  Skin: Warm dry intact with [name] signs of infection or rash on extremities or on axial skeleton.  Abdomen: Soft nontender  Neuro: Cranial nerves II through XII are intact, neurovascularly intact in all extremities with 2+ DTRs and 2+ pulses.  Lymph: [name] lymphadenopathy of posterior or anterior cervical chain or axillae bilaterally.  Gait antalgic gait MSK:  Non tender with full range of motion and good stability and symmetric strength and tone of shoulders, elbows, wrist, hip, and ankles bilaterally.  Knee: Right valgus deformity noted.  Tender to palpation over medial and PF joint line.  ROM full in flexion and extension and lower leg rotation. instability with valgus force.  painful patellar compression. Patellar glide with moderate crepitus. Patellar and quadriceps tendons unremarkable. Hamstring and quadriceps strength is normal. Contralateral knee shows relative changes as well.  After informed written and verbal consent, patient was seated on exam table. Right knee was prepped with alcohol swab and  utilizing anterolateral approach, patient's right knee space was injected with 4:1  marcaine 0.5%: Kenalog 40mg /dL. Patient tolerated the procedure well without immediate complications.    Impression and Recommendations:     This case required medical decision making of moderate complexity. The above documentation has been reviewed and is accurate and complete Lyndal Pulley, DO       Note: This dictation was prepared with Dragon dictation along with smaller phrase technology. Any transcriptional errors that result from this process are unintentional.

## 2018-03-08 ENCOUNTER — Ambulatory Visit: Payer: PRIVATE HEALTH INSURANCE | Admitting: Family Medicine

## 2018-03-08 ENCOUNTER — Encounter: Payer: Self-pay | Admitting: Family Medicine

## 2018-03-08 DIAGNOSIS — M1711 Unilateral primary osteoarthritis, right knee: Secondary | ICD-10-CM | POA: Diagnosis not present

## 2018-03-08 NOTE — Patient Instructions (Addendum)
Great to see you  You should do well  Steroid injection again today  We will get approval  See me again in 2 weeks for sure

## 2018-03-08 NOTE — Assessment & Plan Note (Signed)
Patient given another steroid injection again today.  We will attempt again to get approval for Visco supplementation.  Discussed icing regimen and home exercises.  Discussed potentially wearing the brace on a regular basis.  Follow-up again in 4 to 8 weeks

## 2018-03-09 ENCOUNTER — Other Ambulatory Visit: Payer: Self-pay

## 2018-03-09 DIAGNOSIS — E785 Hyperlipidemia, unspecified: Secondary | ICD-10-CM

## 2018-03-09 MED ORDER — ATORVASTATIN CALCIUM 20 MG PO TABS: 20.0000 mg | ORAL_TABLET | Freq: Every day | ORAL | 1 refills | Status: DC

## 2018-03-11 ENCOUNTER — Other Ambulatory Visit: Payer: Self-pay | Admitting: Internal Medicine

## 2018-03-11 DIAGNOSIS — I10 Essential (primary) hypertension: Secondary | ICD-10-CM

## 2018-03-16 ENCOUNTER — Other Ambulatory Visit: Payer: Self-pay | Admitting: Internal Medicine

## 2018-03-16 DIAGNOSIS — I10 Essential (primary) hypertension: Secondary | ICD-10-CM

## 2018-03-16 MED ORDER — HYDROCHLOROTHIAZIDE 12.5 MG PO CAPS: 12.5000 mg | ORAL_CAPSULE | Freq: Every day | ORAL | 1 refills | Status: DC

## 2018-03-16 MED ORDER — LOSARTAN POTASSIUM 50 MG PO TABS: 50.0000 mg | ORAL_TABLET | Freq: Every day | ORAL | 1 refills | Status: DC

## 2018-03-23 NOTE — Progress Notes (Signed)
[name] [name], [name] [address] Phone: [phone] Subjective:    I'm seeing this patient by the request  of:    CC: Knee pain follow-up  BJY:[id number]   03/08/2018: Patient given another steroid injection again today.  We will attempt again to get approval for Visco supplementation.  Discussed icing regimen and home exercises.  Discussed potentially wearing the brace on a regular basis.  Follow-up again in 4 to 8 weeks  Update 03/24/2018: Juan [name] is a 63 y.o. male coming in with complaint of right knee pain. Patient states that his right knee pain.  Patient was having worsening pain.  Increasing instability, increasing swelling as well.  Patient since then giving him more trouble.  Hoping that I will find something else that would be beneficial.  Prior approval for Synvisc 1 was done previously.     Past Medical History:  Diagnosis Date  . Hypertension   . Personal history of colonic polyps - adenoma 04/24/2008   04/2008 - diminutive adenoma 06/13/2013     Past Surgical History:  Procedure Laterality Date  . APPENDECTOMY  2007   ruptured  . COLONOSCOPY     Social History   Socioeconomic History  . Marital status: Married    Spouse name: Not on file  . Number of children: Not on file  . Years of education: Not on file  . Highest education level: Not on file  Occupational History  . Not on file  Social Needs  . Financial resource strain: Not on file  . Food insecurity:    Worry: Not on file    Inability: Not on file  . Transportation needs:    Medical: Not on file    Non-medical: Not on file  Tobacco Use  . Smoking status: Former Smoker    Last attempt to quit: 01/06/1999    Years since quitting: 19.2  . Smokeless tobacco: Never Used  Substance and Sexual Activity  . Alcohol use: No  . Drug use: No    Comment: clean and sober for 68yrs after being addicted to cocaine  . Sexual activity: Yes  Lifestyle  .  Physical activity:    Days per week: Not on file    Minutes per session: Not on file  . Stress: Not on file  Relationships  . Social connections:    Talks on phone: Not on file    Gets together: Not on file    Attends religious service: Not on file    Active member of club or organization: Not on file    Attends meetings of clubs or organizations: Not on file    Relationship status: Not on file  Other Topics Concern  . Not on file  Social History Narrative   Regular exercise-Yes   He did rehab 12 years ago and has screened several times for HIV and Hep A/B/C and always negative.   Allergies  Allergen Reactions  . Ace Inhibitors     REACTION: cough   Family History  Problem Relation Age of Onset  . Colon cancer Neg Hx   . Prostate cancer Father   . Heart disease Father     Current Outpatient Medications (Endocrine & Metabolic):  .  testosterone cypionate (DEPOTESTOSTERONE CYPIONATE) 200 MG/ML injection, INJECT 1 ML IN THE MUSCLE EVERY 14 DAYS  Current Outpatient Medications (Cardiovascular):  .  atorvastatin (LIPITOR) 20 MG tablet, Take 1 tablet (20 mg total) by mouth daily. Marland Kitchen  hydrochlorothiazide (MICROZIDE) 12.5 MG capsule, Take 1 capsule (12.5 mg total) by mouth daily. Marland Kitchen  losartan (COZAAR) 50 MG tablet, Take 1 tablet (50 mg total) by mouth daily. .  tadalafil (CIALIS) 5 MG tablet, Take 1 tablet (5 mg total) by mouth daily as needed for erectile dysfunction.   Current Outpatient Medications (Analgesics):  .  aspirin 81 MG tablet, Take 81 mg by mouth daily.     Current Outpatient Medications (Other):  Marland Kitchen  B-D 3CC LUER-LOK SYR 23GX1" 23G X 1" 3 ML MISC,  .  BD DISP NEEDLES 18G X 1-1/2" MISC,  .  DULoxetine (CYMBALTA) 30 MG capsule, Take 1 capsule (30 mg total) by mouth daily. .  fish oil-omega-3 fatty acids 1000 MG capsule, Take 2 g by mouth daily.   .  traZODone (DESYREL) 50 MG tablet, Take 2 tablets (100 mg total) by mouth at bedtime.    Past medical history,  social, surgical and family history all reviewed in electronic medical record.  No pertanent information unless stated regarding to the chief complaint.   Review of Systems:  No headache, visual changes, nausea, vomiting, diarrhea, constipation, dizziness, abdominal pain, skin rash, fevers, chills, night sweats, weight loss, swollen lymph nodes, body aches, joint swelling,  chest pain, shortness of breath, mood changes.  Positive muscle aches  Objective  Blood pressure 118/82, pulse 74, height 6' (1.829 m), weight 179 lb (81.2 kg), SpO2 98 %.   General: No apparent distress alert and oriented x3 mood and affect normal, dressed appropriately.  HEENT: Pupils equal, extraocular movements intact  Respiratory: Patient's speak in full sentences and does not appear short of breath  Cardiovascular: No lower extremity edema, non tender, no erythema  Skin: Warm dry intact with no signs of infection or rash on extremities or on axial skeleton.  Abdomen: Soft nontender  Neuro: Cranial nerves II through XII are intact, neurovascularly intact in all extremities with 2+ DTRs and 2+ pulses.  Lymph: No lymphadenopathy of posterior or anterior cervical chain or axillae bilaterally.  Gait mild antalgic MSK:  Non tender with full range of motion and good stability and symmetric strength and tone of shoulders, elbows, wrist, hip, and ankles bilaterally.  Knee exam shows the patient does have some abnormal thigh to calf ratio.  Some instability with varus force.  Lacks in the last 2 degrees of extension. After informed written and verbal consent, patient was seated on exam table. Right knee was prepped with alcohol swab and utilizing anterolateral approach, patient's right knee space was injected with 48mg /6 mL of Synvisc (sodium hyaluronate) in a prefilled syringe was injected easily into the knee through a 22-gauge needle.  Patient tolerated the procedure well without immediate complications.    Impression and  Recommendations:     This case required medical decision making of moderate complexity. The above documentation has been reviewed and is accurate and complete Lyndal Pulley, DO       Note: This dictation was prepared with Dragon dictation along with smaller phrase technology. Any transcriptional errors that result from this process are unintentional.

## 2018-03-24 ENCOUNTER — Encounter: Payer: Self-pay | Admitting: Family Medicine

## 2018-03-24 ENCOUNTER — Other Ambulatory Visit: Payer: Self-pay

## 2018-03-24 ENCOUNTER — Ambulatory Visit: Payer: PRIVATE HEALTH INSURANCE | Admitting: Family Medicine

## 2018-03-24 DIAGNOSIS — M170 Bilateral primary osteoarthritis of knee: Secondary | ICD-10-CM | POA: Diagnosis not present

## 2018-03-24 NOTE — Assessment & Plan Note (Signed)
Bilateral knee pain.  Severe osteoarthritic changes.  Discussed which activities to do which will still avoid.  Discussed that it will take about a month to work.  Follow-up with me again in 4 to 8 weeks

## 2018-03-24 NOTE — Patient Instructions (Signed)
Good to see you   

## 2018-03-28 ENCOUNTER — Other Ambulatory Visit: Payer: Self-pay | Admitting: Internal Medicine

## 2018-03-28 DIAGNOSIS — M170 Bilateral primary osteoarthritis of knee: Secondary | ICD-10-CM

## 2018-03-28 DIAGNOSIS — M1711 Unilateral primary osteoarthritis, right knee: Secondary | ICD-10-CM

## 2018-03-28 DIAGNOSIS — F320 Major depressive disorder, single episode, mild: Secondary | ICD-10-CM

## 2018-03-28 MED ORDER — DULOXETINE HCL 60 MG PO CPEP: 60.0000 mg | ORAL_CAPSULE | Freq: Every day | ORAL | 1 refills | Status: DC

## 2018-04-08 ENCOUNTER — Other Ambulatory Visit: Payer: Self-pay | Admitting: Internal Medicine

## 2018-04-08 DIAGNOSIS — F409 Phobic anxiety disorder, unspecified: Secondary | ICD-10-CM

## 2018-04-08 DIAGNOSIS — F5105 Insomnia due to other mental disorder: Principal | ICD-10-CM

## 2018-04-29 ENCOUNTER — Other Ambulatory Visit: Payer: Self-pay | Admitting: Internal Medicine

## 2018-05-25 ENCOUNTER — Encounter: Payer: Self-pay | Admitting: Internal Medicine

## 2018-06-03 ENCOUNTER — Telehealth: Payer: Self-pay | Admitting: Internal Medicine

## 2018-06-03 NOTE — Telephone Encounter (Signed)
Copied from [place] [id number]. Topic: Quick Communication - See Telephone Encounter >> Jun 03, 2018  3:24 PM Blase Mess A wrote: CRM for notification. See Telephone encounter for: 06/03/18. 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  If Dr. Ronnald Ramp is in agreement. Can this medication be refilled.  [facility] [phone] - [place], [place] [phone] (Phone) [phone] (Fax)

## 2018-06-06 ENCOUNTER — Other Ambulatory Visit: Payer: Self-pay | Admitting: Internal Medicine

## 2018-06-07 NOTE — Telephone Encounter (Signed)
I don't think he has BPH  TJ

## 2018-06-08 ENCOUNTER — Other Ambulatory Visit: Payer: Self-pay

## 2018-06-08 ENCOUNTER — Ambulatory Visit (INDEPENDENT_AMBULATORY_CARE_PROVIDER_SITE_OTHER): Payer: No Typology Code available for payment source | Admitting: Family Medicine

## 2018-06-08 ENCOUNTER — Other Ambulatory Visit: Payer: Self-pay | Admitting: Internal Medicine

## 2018-06-08 ENCOUNTER — Encounter: Payer: Self-pay | Admitting: Family Medicine

## 2018-06-08 ENCOUNTER — Other Ambulatory Visit: Payer: PRIVATE HEALTH INSURANCE

## 2018-06-08 VITALS — BP 130/88 | HR 68 | Ht 72.0 in | Wt 176.0 lb

## 2018-06-08 DIAGNOSIS — G8929 Other chronic pain: Secondary | ICD-10-CM

## 2018-06-08 DIAGNOSIS — M1711 Unilateral primary osteoarthritis, right knee: Secondary | ICD-10-CM

## 2018-06-08 DIAGNOSIS — M25569 Pain in unspecified knee: Secondary | ICD-10-CM

## 2018-06-08 DIAGNOSIS — N529 Male erectile dysfunction, unspecified: Secondary | ICD-10-CM

## 2018-06-08 MED ORDER — LIDOCAINE 5 % EX PTCH: 1.0000 | MEDICATED_PATCH | Freq: Two times a day (BID) | CUTANEOUS | 2 refills | Status: DC

## 2018-06-08 MED ORDER — TADALAFIL 20 MG PO TABS: 20.0000 mg | ORAL_TABLET | Freq: Every day | ORAL | 5 refills | Status: DC | PRN

## 2018-06-08 NOTE — Telephone Encounter (Signed)
lvm for pt to call back.   Will inform pt of same once he calls.

## 2018-06-08 NOTE — Telephone Encounter (Signed)
Pt has been informed that he doesn't have BPH. Pt stated understanding.   Pt is requesting for an increase dose so he can split the tablets to get more doses.  Please Advise

## 2018-06-08 NOTE — Progress Notes (Signed)
[name] [address], [place] [address] Phone: [phone] Subjective:   I Kandace Blitz am serving as a Education administrator for Dr. Hulan Saas.   CC: bilateral knee pain   IRJ:[id number]   03/24/2018 Bilateral knee pain.  Severe osteoarthritic changes.  Discussed which activities to do which will still avoid.  Discussed that it will take about a month to work.  Follow-up with me again in 4 to 8 weeks  06/08/2018 Dalvin Clipper is a 63 y.o. male coming in with complaint of bilateral knee pain. States that his knees are a little painful.  Patient does have knee arthritis bilaterally.  Patient feels that the viscosupplementation did not help.  Patient thinks that the right knee is worse at the moment and is having increasing swelling.  Sore with increasing instability.  Known mild to moderate osteoarthritic changes mostly of the medial compartment      synvisc 3/19  Past Medical History:  Diagnosis Date  . Hypertension   . Personal history of colonic polyps - adenoma 04/24/2008   04/2008 - diminutive adenoma 06/13/2013     Past Surgical History:  Procedure Laterality Date  . APPENDECTOMY  2007   ruptured  . COLONOSCOPY     Social History   Socioeconomic History  . Marital status: Married    Spouse name: Not on file  . Number of children: Not on file  . Years of education: Not on file  . Highest education level: Not on file  Occupational History  . Not on file  Social Needs  . Financial resource strain: Not on file  . Food insecurity:    Worry: Not on file    Inability: Not on file  . Transportation needs:    Medical: Not on file    Non-medical: Not on file  Tobacco Use  . Smoking status: Former Smoker    Last attempt to quit: 01/06/1999    Years since quitting: 19.4  . Smokeless tobacco: Never Used  Substance and Sexual Activity  . Alcohol use: No  . Drug use: No    Comment: clean and sober for 47yrs after being addicted to cocaine  .  Sexual activity: Yes  Lifestyle  . Physical activity:    Days per week: Not on file    Minutes per session: Not on file  . Stress: Not on file  Relationships  . Social connections:    Talks on phone: Not on file    Gets together: Not on file    Attends religious service: Not on file    Active member of club or organization: Not on file    Attends meetings of clubs or organizations: Not on file    Relationship status: Not on file  Other Topics Concern  . Not on file  Social History Narrative   Regular exercise-Yes   He did rehab 12 years ago and has screened several times for HIV and Hep A/B/C and always negative.   Allergies  Allergen Reactions  . Ace Inhibitors     REACTION: cough   Family History  Problem Relation Age of Onset  . Colon cancer Neg Hx   . Prostate cancer Father   . Heart disease Father     Current Outpatient Medications (Endocrine & Metabolic):  .  testosterone cypionate (DEPOTESTOSTERONE CYPIONATE) 200 MG/ML injection, INJECT 1 ML IN THE MUSCLE EVERY 14 DAYS  Current Outpatient Medications (Cardiovascular):  .  atorvastatin (LIPITOR) 20 MG tablet, Take 1  tablet (20 mg total) by mouth daily. .  hydrochlorothiazide (MICROZIDE) 12.5 MG capsule, Take 1 capsule (12.5 mg total) by mouth daily. Marland Kitchen  losartan (COZAAR) 50 MG tablet, Take 1 tablet (50 mg total) by mouth daily. .  tadalafil (CIALIS) 5 MG tablet, Take 1 tablet (5 mg total) by mouth daily as needed for erectile dysfunction.   Current Outpatient Medications (Analgesics):  .  aspirin 81 MG tablet, Take 81 mg by mouth daily.     Current Outpatient Medications (Other):  Marland Kitchen  B-D 3CC LUER-LOK SYR 18GX1-1/2 18G X 1-1/2" 3 ML MISC, USE AS DIRECTED FOR TESTOSTERONE INJECTIONS .  B-D 3CC LUER-LOK SYR 23GX1" 23G X 1" 3 ML MISC,  .  BD DISP NEEDLE 23G X 1" MISC, USE AS DIRECTED FOR TESTOSTERONE INJECTIONS .  BD DISP NEEDLES 18G X 1-1/2" MISC,  .  DULoxetine (CYMBALTA) 60 MG capsule, Take 1 capsule (60 mg  total) by mouth daily. .  fish oil-omega-3 fatty acids 1000 MG capsule, Take 2 g by mouth daily.   .  traZODone (DESYREL) 50 MG tablet, TAKE 1 TO 2 TABLETS BY MOUTH AT BEDTIME IF NEEDED .  lidocaine (LIDODERM) 5 %, Place 1 patch onto the skin every 12 (twelve) hours. Remove & Discard patch within 12 hours or as directed by MD    Past medical history, social, surgical and family history all reviewed in electronic medical record.  No pertanent information unless stated regarding to the chief complaint.   Review of Systems:  No headache, visual changes, nausea, vomiting, diarrhea, constipation, dizziness, abdominal pain, skin rash, fevers, chills, night sweats, weight loss, swollen lymph nodes, body aches,chest pain, shortness of breath, mood changes.  Positive muscle aches and joint swelling  Objective  Blood pressure 130/88, pulse 68, height 6' (1.829 m), weight 176 lb (79.8 kg), SpO2 97 %.    General: No apparent distress alert and oriented x3 mood and affect normal, dressed appropriately.  HEENT: Pupils equal, extraocular movements intact  Respiratory: Patient's speak in full sentences and does not appear short of breath  Cardiovascular: No lower extremity edema, non tender, no erythema  Skin: Warm dry intact with no signs of infection or rash on extremities or on axial skeleton.  Abdomen: Soft nontender  Neuro: Cranial nerves II through XII are intact, neurovascularly intact in all extremities with 2+ DTRs and 2+ pulses.  Lymph: No lymphadenopathy of posterior or anterior cervical chain or axillae bilaterally.  Gait normal with good balance and coordination.  MSK:  Non tender with full range of motion and good stability and symmetric strength and tone of shoulders, elbows, wrist, hip, and ankles bilaterally.  Knee:bilateral  valgus deformity noted.  Abnormal thigh to calf ratio.  Tender to palpation over medial and PF joint line.  ROM full in flexion and extension and lower leg  rotation. instability with valgus force.  Right greater than left painful patellar compression. Patellar glide with moderate crepitus. Patellar and quadriceps tendons unremarkable. Hamstring and quadriceps strength is normal.   Procedure: Real-time Ultrasound Guided Injection of right knee Device: GE Logiq Q7 Ultrasound guided injection is preferred based studies that show increased duration, increased effect, greater accuracy, decreased procedural pain, increased response rate, and decreased cost with ultrasound guided versus blind injection.  Verbal informed consent obtained.  Time-out conducted.  Noted no overlying erythema, induration, or other signs of local infection.  Skin prepped in a sterile fashion.  Local anesthesia: Topical Ethyl chloride.  With sterile technique and under real  time ultrasound guidance: With a 22-gauge 2 inch needle patient was injected with 4 cc of 0.5% Marcaine and then aspirated 21 cc of straw-colored fluid with significant number of floating bodies possible gout then injected 1 cc of Kenalog 40 mg/dL. This was from a superior lateral approach.  Completed without difficulty  Pain immediately resolved suggesting accurate placement of the medication.  Advised to call if fevers/chills, erythema, induration, drainage, or persistent bleeding.  Images permanently stored and available for review in the ultrasound unit.  Impression: Technically successful ultrasound guided injection.   Impression and Recommendations:     This case required medical decision making of moderate complexity. The above documentation has been reviewed and is accurate and complete Lyndal Pulley, DO       Note: This dictation was prepared with Dragon dictation along with smaller phrase technology. Any transcriptional errors that result from this process are unintentional.

## 2018-06-08 NOTE — Patient Instructions (Addendum)
Good to see you  I drained the knee again.  If it come back in 2 weeks I would like to do MRI  Read about prp but even before that I would do MRI  OtTry lidocaine patch 1/2 patch for 21 hours then stop it  Write me in 2 weeks

## 2018-06-08 NOTE — Assessment & Plan Note (Signed)
Discussed with patient in great length.  Patient feels that lidocaine patches were helpful but he got something was frozen.  We will give him a prescription for this.  Concern for potential gout deposits and will send down the synovial fluid for further evaluation.Woudl like MRI before PRP.  Patient in agreement.

## 2018-06-09 LAB — SYNOVIAL CELL COUNT + DIFF, W/ CRYSTALS
Basophils, %: 0 %
Eosinophils-Synovial: 0 % (ref 0–2)
Lymphocytes-Synovial Fld: 52 % (ref 0–74)
Monocyte/Macrophage: 36 % (ref 0–69)
Neutrophil, Synovial: 4 % (ref 0–24)
Synoviocytes, %: 8 % (ref 0–15)
WBC, Synovial: 80 cells/uL (ref <150)

## 2018-06-20 ENCOUNTER — Other Ambulatory Visit: Payer: Self-pay

## 2018-06-20 ENCOUNTER — Telehealth: Payer: Self-pay

## 2018-06-20 DIAGNOSIS — G8929 Other chronic pain: Secondary | ICD-10-CM

## 2018-06-20 NOTE — Telephone Encounter (Signed)
Spoke with patient. Has had no relief from injection. Dr. Tamala Julian recommends an MRI. Let patient know and order has been placed.

## 2018-07-04 ENCOUNTER — Other Ambulatory Visit: Payer: Self-pay | Admitting: Internal Medicine

## 2018-07-04 DIAGNOSIS — E291 Testicular hypofunction: Secondary | ICD-10-CM

## 2018-07-04 DIAGNOSIS — F409 Phobic anxiety disorder, unspecified: Secondary | ICD-10-CM

## 2018-07-04 MED ORDER — TRAZODONE HCL 50 MG PO TABS: ORAL_TABLET | ORAL | 1 refills | Status: DC

## 2018-07-12 ENCOUNTER — Other Ambulatory Visit: Payer: Self-pay

## 2018-07-12 ENCOUNTER — Ambulatory Visit
Admission: RE | Admit: 2018-07-12 | Discharge: 2018-07-12 | Disposition: A | Discharge status: Home / Self Care | Payer: No Typology Code available for payment source | Source: Ambulatory Visit | Attending: Family Medicine | Admitting: Family Medicine

## 2018-07-12 DIAGNOSIS — M25561 Pain in right knee: Secondary | ICD-10-CM

## 2018-07-12 DIAGNOSIS — G8929 Other chronic pain: Secondary | ICD-10-CM

## 2018-07-19 ENCOUNTER — Other Ambulatory Visit: Payer: Self-pay | Admitting: Physical Therapy

## 2018-07-19 DIAGNOSIS — M25561 Pain in right knee: Secondary | ICD-10-CM

## 2018-07-19 DIAGNOSIS — G8929 Other chronic pain: Secondary | ICD-10-CM

## 2018-09-02 ENCOUNTER — Other Ambulatory Visit: Payer: Self-pay | Admitting: Internal Medicine

## 2018-09-02 DIAGNOSIS — I10 Essential (primary) hypertension: Secondary | ICD-10-CM

## 2018-09-02 MED ORDER — LOSARTAN POTASSIUM 50 MG PO TABS: 50.0000 mg | ORAL_TABLET | Freq: Every day | ORAL | 1 refills | Status: DC

## 2018-09-05 ENCOUNTER — Other Ambulatory Visit: Payer: Self-pay | Admitting: Internal Medicine

## 2018-09-05 DIAGNOSIS — E785 Hyperlipidemia, unspecified: Secondary | ICD-10-CM

## 2018-09-05 MED ORDER — ATORVASTATIN CALCIUM 20 MG PO TABS: 20.0000 mg | ORAL_TABLET | Freq: Every day | ORAL | 1 refills | Status: DC

## 2018-09-14 ENCOUNTER — Ambulatory Visit: Payer: Self-pay | Admitting: *Deleted

## 2018-09-14 NOTE — Telephone Encounter (Signed)
Patient is having dizziness and hot flashes. Patient states he is very sensitive to BP changes. Call to office to see if patient can have sooner appointment due to his sensitivity and symptoms.  Reason for Disposition . [id number] Systolic BP  >= [id number] OR Diastolic >= 80 AND [id number] taking BP medications  Answer Assessment - Initial Assessment Questions 1. BLOOD PRESSURE: "What is the blood pressure?" "Did you take at least two measurements 5 minutes apart?"     126/94- today  , 149/97 2. ONSET: "When did you take your blood pressure?"     11:30, 12:50 3. HOW: "How did you obtain the blood pressure?" (e.g., visiting nurse, automatic home BP monitor)     Automatic cuff 4. HISTORY: "Do you have a history of high blood pressure?"     yes 5. MEDICATIONS: "Are you taking any medications for blood pressure?" "Have you missed any doses recently?"     Yes- no missed doses 6. OTHER SYMPTOMS: "Do you have any symptoms?" (e.g., headache, chest pain, blurred vision, difficulty breathing, weakness)     Dizziness, slight SOB 7. PREGNANCY: "Is there any chance you are pregnant?" "When was your last menstrual period?"     n/a  Protocols used: HIGH BLOOD PRESSURE-A-AH

## 2018-09-15 ENCOUNTER — Ambulatory Visit (INDEPENDENT_AMBULATORY_CARE_PROVIDER_SITE_OTHER): Payer: No Typology Code available for payment source | Admitting: Internal Medicine

## 2018-09-15 ENCOUNTER — Encounter: Payer: Self-pay | Admitting: Internal Medicine

## 2018-09-15 ENCOUNTER — Other Ambulatory Visit: Payer: Self-pay

## 2018-09-15 ENCOUNTER — Other Ambulatory Visit: Payer: No Typology Code available for payment source

## 2018-09-15 ENCOUNTER — Ambulatory Visit (INDEPENDENT_AMBULATORY_CARE_PROVIDER_SITE_OTHER)
Admission: RE | Admit: 2018-09-15 | Discharge: 2018-09-15 | Disposition: A | Discharge status: Home / Self Care | Payer: No Typology Code available for payment source | Source: Ambulatory Visit | Attending: Internal Medicine | Admitting: Internal Medicine

## 2018-09-15 VITALS — BP 142/94 | HR 63 | Temp 98.4°F | Resp 16 | Ht 72.0 in | Wt 174.0 lb

## 2018-09-15 DIAGNOSIS — R059 Cough, unspecified: Secondary | ICD-10-CM | POA: Insufficient documentation

## 2018-09-15 DIAGNOSIS — I10 Essential (primary) hypertension: Secondary | ICD-10-CM | POA: Diagnosis not present

## 2018-09-15 DIAGNOSIS — R05 Cough: Secondary | ICD-10-CM | POA: Diagnosis not present

## 2018-09-15 DIAGNOSIS — Z23 Encounter for immunization: Secondary | ICD-10-CM

## 2018-09-15 MED ORDER — INDAPAMIDE 1.25 MG PO TABS: 1.2500 mg | ORAL_TABLET | Freq: Every day | ORAL | 0 refills | Status: DC

## 2018-09-15 MED ORDER — LOSARTAN POTASSIUM 100 MG PO TABS: 100.0000 mg | ORAL_TABLET | Freq: Every day | ORAL | 0 refills | Status: DC

## 2018-09-15 NOTE — Progress Notes (Signed)
Subjective:  Patient ID: Juan [name], male    DOB: Jan 30, 1955  Age: 63 y.o. MRN: [id number]  CC: Hypertension and URI   HPI Juan [name] presents for f/up - He complains that his blood pressure has not been well controlled.  3 days ago he checked his blood pressure at home because he had a headache and dizziness and found that it was 150/100.  He denies CP, DOE, palpitations, or edema.  He complains of a 2-day history of sore throat with chills and nonproductive cough but no fever.  He also complains of fatigue.  He denies change in sense of smell, rash, diaphoresis, night sweats, or shortness of breath.  Outpatient Medications Prior to Visit  Medication Sig Dispense Refill  . aspirin 81 MG tablet Take 81 mg by mouth daily.      Marland Kitchen atorvastatin (LIPITOR) 20 MG tablet Take 1 tablet (20 mg total) by mouth daily. 90 tablet 1  . B-D 3CC LUER-LOK SYR 18GX1-1/2 18G X 1-1/2" 3 ML MISC USE AS DIRECTED FOR TESTOSTERONE INJECTIONS 100 each 1  . B-D 3CC LUER-LOK SYR 23GX1" 23G X 1" 3 ML MISC   0  . BD DISP NEEDLE 23G X 1" MISC USE AS DIRECTED FOR TESTOSTERONE INJECTIONS 100 each 1  . BD DISP NEEDLES 18G X 1-1/2" MISC   0  . DULoxetine (CYMBALTA) 60 MG capsule Take 1 capsule (60 mg total) by mouth daily. 90 capsule 1  . fish oil-omega-3 fatty acids 1000 MG capsule Take 2 g by mouth daily.      Marland Kitchen lidocaine (LIDODERM) 5 % Place 1 patch onto the skin every 12 (twelve) hours. Remove & Discard patch within 12 hours or as directed by MD 30 patch 2  . tadalafil (CIALIS) 20 MG tablet Take 1 tablet (20 mg total) by mouth daily as needed for erectile dysfunction. 10 tablet 5  . testosterone cypionate (DEPOTESTOSTERONE CYPIONATE) 200 MG/ML injection INJECT 1 ML IN THE MUSCLE EVERY 14 DAYS 10 mL 0  . traZODone (DESYREL) 50 MG tablet TAKE 1 TO 2 TABLETS BY MOUTH AT BEDTIME IF NEEDED 180 tablet 1  . hydrochlorothiazide (MICROZIDE) 12.5 MG capsule Take 1 capsule (12.5 mg total) by mouth daily. 90 capsule 1  .  losartan (COZAAR) 50 MG tablet Take 1 tablet (50 mg total) by mouth daily. 90 tablet 1   No facility-administered medications prior to visit.     ROS Review of Systems  Constitutional: Positive for chills and fatigue. Negative for diaphoresis, fever and unexpected weight change.  HENT: Positive for sore throat. Negative for trouble swallowing and voice change.   Eyes: Negative.   Respiratory: Positive for cough. Negative for chest tightness, shortness of breath and wheezing.   Cardiovascular: Negative for chest pain, palpitations and leg swelling.  Gastrointestinal: Negative for abdominal pain, constipation, diarrhea, nausea and vomiting.  Endocrine: Negative.   Genitourinary: Negative.  Negative for difficulty urinating, dysuria and hematuria.  Musculoskeletal: Negative.  Negative for arthralgias and myalgias.  Skin: Negative for color change and rash.  Neurological: Positive for dizziness and headaches. Negative for weakness and numbness.  Hematological: Negative.  Negative for adenopathy.  Psychiatric/Behavioral: Negative.     Objective:  BP (!) 142/94 (BP Location: Left Arm, Patient Position: Sitting, Cuff Size: Normal)   Pulse 63   Temp 98.4 F (36.9 C) (Oral)   Resp 16   Ht 6' (1.829 m)   Wt 174 lb (78.9 kg)   SpO2 97%   BMI 23.60 kg/m  BP Readings from Last 3 Encounters:  09/15/18 (!) 142/94  06/08/18 130/88  03/24/18 118/82    Wt Readings from Last 3 Encounters:  09/15/18 174 lb (78.9 kg)  06/08/18 176 lb (79.8 kg)  03/24/18 179 lb (81.2 kg)    Physical Exam Vitals signs reviewed.  Constitutional:      General: He is not in acute distress.    Appearance: He is not ill-appearing, toxic-appearing or diaphoretic.  HENT:     Nose: Nose normal.     Mouth/Throat:     Mouth: Mucous membranes are moist.     Palate: No mass.     Pharynx: Posterior oropharyngeal erythema present. No pharyngeal swelling, oropharyngeal exudate or uvula swelling.     Tonsils: No  tonsillar exudate or tonsillar abscesses.  Eyes:     General: No scleral icterus.    Conjunctiva/sclera: Conjunctivae normal.  Neck:     Musculoskeletal: Normal range of motion. No neck rigidity or muscular tenderness.  Cardiovascular:     Rate and Rhythm: Normal rate and regular rhythm.     Heart sounds: No murmur.  Pulmonary:     Effort: Pulmonary effort is normal. No respiratory distress.     Breath sounds: No stridor. No wheezing, rhonchi or rales.  Abdominal:     General: Abdomen is flat.     Palpations: There is no mass.     Tenderness: There is no abdominal tenderness. There is no guarding.  Musculoskeletal: Normal range of motion.     Right lower leg: No edema.     Left lower leg: No edema.  Lymphadenopathy:     Head:     Right side of head: No occipital adenopathy.     Left side of head: No occipital adenopathy.     Cervical: No cervical adenopathy.     Upper Body:     Right upper body: No supraclavicular adenopathy.     Left upper body: No supraclavicular adenopathy.  Skin:    General: Skin is warm and dry.     Findings: No rash.  Neurological:     General: No focal deficit present.     Mental Status: He is alert.     Lab Results  Component Value Date   WBC 6.0 02/28/2018   HGB 14.6 02/28/2018   HCT 42.6 02/28/2018   PLT 392.0 02/28/2018   GLUCOSE 88 02/28/2018   CHOL 165 02/28/2018   TRIG 227.0 (H) 02/28/2018   HDL 52.80 02/28/2018   LDLDIRECT 98.0 02/28/2018   LDLCALC 114 (H) 12/21/2016   ALT 37 02/28/2018   AST 23 02/28/2018   NA 139 02/28/2018   K 3.9 02/28/2018   CL 105 02/28/2018   CREATININE 0.88 02/28/2018   BUN 18 02/28/2018   CO2 25 02/28/2018   TSH 1.37 02/28/2018   PSA 0.24 02/28/2018   HGBA1C 5.3 07/13/2012    Mr Knee Right Wo Contrast  Result Date: 07/12/2018 CLINICAL DATA:  Right knee pain for years 3 years without injury. EXAM: MRI OF THE RIGHT KNEE WITHOUT CONTRAST TECHNIQUE: Multiplanar, multisequence MR imaging of the knee was  performed. No intravenous contrast was administered. COMPARISON:  None. FINDINGS: MENISCI Medial meniscus: Large radial tear of the body of the medial meniscus. Partial radial tear of the posterior horn of the medial meniscus. Lateral meniscus:  Intact. LIGAMENTS Cruciates: Intact ACL and PCL. ACL is expanded and increased in signal consistent with mucinous degeneration. Subcortical reactive marrow changes at the ACL insertion. Collaterals: Medial collateral  ligament is intact. Lateral collateral ligament complex is intact. CARTILAGE Patellofemoral: Small focal area of partial-thickness cartilage loss of the medial patellar facet with subchondral reactive marrow change. Chondral thinning of medial patellar facet. Chondral thinning with partial thickness cartilage loss of the medial trochlea. Medial: Full-thickness cartilage loss of the medial femorotibial compartment with mild subchondral reactive marrow edema. Lateral: No focal chondral defect. Cartilage irregularity along the posterior lateral tibial plateau. Joint: Large joint effusion. Minimal edema in Hoffa's fat. No plical thickening. Popliteal Fossa:  No Baker cyst. Intact popliteus tendon. Extensor Mechanism: Intact quadriceps tendon. Intact patellar tendon. Intact medial patellar retinaculum. Intact lateral patellar retinaculum. Intact MPFL. Bones:  No acute osseous abnormality.  No aggressive osseous lesion. Other: No fluid collection or hematoma. IMPRESSION: 1. Large radial tear of the body of the medial meniscus. Partial radial tear of the posterior horn of the medial meniscus. 2. Full-thickness cartilage loss of the medial femorotibial compartment with mild subchondral reactive marrow edema. 3. Large joint effusion. 4. Intact ACL with mucinous degeneration. Electronically Signed   By: Kathreen Devoid   On: 07/12/2018 08:01   Dg Chest 2 View  Result Date: 09/15/2018 CLINICAL DATA:  Cough EXAM: CHEST - 2 VIEW COMPARISON:  None. FINDINGS: The heart size  and mediastinal contours are within normal limits. Both lungs are clear. The visualized skeletal structures are unremarkable. IMPRESSION: No active cardiopulmonary disease. Electronically Signed   By: Inez Catalina M.D.   On: 09/15/2018 09:48    Assessment & Plan:   Erich was seen today for hypertension and uri.  Diagnoses and all orders for this visit:  Need for influenza vaccination -     Flu Vaccine QUAD 36+ mos IM  Cough- He has mild symptoms, a normal exam and a normal chest x-ray.  This is a viral URI.  Antibiotics are not indicated.  He will use over-the-counter meds for symptom relief. -     DG Chest 2 View; Future  Essential hypertension- His blood pressure is not adequately well controlled so I have asked him to increase the dose of losartan and to add on a thiazide diuretic -     losartan (COZAAR) 100 MG tablet; Take 1 tablet (100 mg total) by mouth daily. -     indapamide (LOZOL) 1.25 MG tablet; Take 1 tablet (1.25 mg total) by mouth daily.   I have discontinued Kayse Antkowiak's hydrochlorothiazide and losartan. I am also having him start on losartan and indapamide. Additionally, I am having him maintain his fish oil-omega-3 fatty acids, aspirin, BD Disp Needles, B-D 3CC LUER-LOK SYR 23GX1", DULoxetine, B-D 3CC LUER-LOK SYR 18GX1-1/2, BD Disp Needle, lidocaine, tadalafil, testosterone cypionate, traZODone, and atorvastatin.  Meds ordered this encounter  Medications  . losartan (COZAAR) 100 MG tablet    Sig: Take 1 tablet (100 mg total) by mouth daily.    Dispense:  90 tablet    Refill:  0  . indapamide (LOZOL) 1.25 MG tablet    Sig: Take 1 tablet (1.25 mg total) by mouth daily.    Dispense:  90 tablet    Refill:  0     Follow-up: Return in 3 months (on 12/15/2018).  Scarlette Calico, MD

## 2018-09-15 NOTE — Patient Instructions (Addendum)

## 2018-09-26 ENCOUNTER — Other Ambulatory Visit: Payer: Self-pay | Admitting: Internal Medicine

## 2018-09-26 DIAGNOSIS — F320 Major depressive disorder, single episode, mild: Secondary | ICD-10-CM

## 2018-09-26 DIAGNOSIS — M170 Bilateral primary osteoarthritis of knee: Secondary | ICD-10-CM

## 2018-09-26 MED ORDER — DULOXETINE HCL 60 MG PO CPEP: 60.0000 mg | ORAL_CAPSULE | Freq: Every day | ORAL | 1 refills | Status: DC

## 2018-11-30 ENCOUNTER — Other Ambulatory Visit: Payer: Self-pay | Admitting: Internal Medicine

## 2018-11-30 DIAGNOSIS — R6882 Decreased libido: Secondary | ICD-10-CM

## 2018-11-30 DIAGNOSIS — N529 Male erectile dysfunction, unspecified: Secondary | ICD-10-CM

## 2018-11-30 MED ORDER — TADALAFIL 5 MG PO TABS: 5.0000 mg | ORAL_TABLET | Freq: Every day | ORAL | 5 refills | Status: DC | PRN

## 2018-12-06 ENCOUNTER — Telehealth: Payer: Self-pay

## 2018-12-06 NOTE — Telephone Encounter (Signed)
Key: [id number]

## 2018-12-12 ENCOUNTER — Other Ambulatory Visit: Payer: Self-pay | Admitting: Internal Medicine

## 2018-12-12 DIAGNOSIS — E291 Testicular hypofunction: Secondary | ICD-10-CM

## 2018-12-12 DIAGNOSIS — I10 Essential (primary) hypertension: Secondary | ICD-10-CM

## 2018-12-12 MED ORDER — LOSARTAN POTASSIUM 100 MG PO TABS: 100.0000 mg | ORAL_TABLET | Freq: Every day | ORAL | 0 refills | Status: DC

## 2018-12-12 MED ORDER — INDAPAMIDE 1.25 MG PO TABS: 1.2500 mg | ORAL_TABLET | Freq: Every day | ORAL | 0 refills | Status: DC

## 2018-12-12 MED ORDER — TESTOSTERONE CYPIONATE 200 MG/ML IM SOLN: 200.0000 mg | INTRAMUSCULAR | 0 refills | Status: DC

## 2018-12-13 NOTE — Telephone Encounter (Signed)
PA has been granted.   Pt informed of same.

## 2019-01-03 ENCOUNTER — Other Ambulatory Visit: Payer: Self-pay | Admitting: Internal Medicine

## 2019-01-03 DIAGNOSIS — F5105 Insomnia due to other mental disorder: Secondary | ICD-10-CM

## 2019-01-03 DIAGNOSIS — F409 Phobic anxiety disorder, unspecified: Secondary | ICD-10-CM

## 2019-01-03 MED ORDER — TRAZODONE HCL 50 MG PO TABS: ORAL_TABLET | ORAL | 1 refills | Status: DC

## 2019-01-18 DIAGNOSIS — M1711 Unilateral primary osteoarthritis, right knee: Secondary | ICD-10-CM | POA: Diagnosis not present

## 2019-01-30 ENCOUNTER — Telehealth: Payer: Self-pay

## 2019-01-30 NOTE — Telephone Encounter (Signed)
New message    1. What are your last BP readings?   Today 150/99 @ 10 min ago.    Yesterday the same reading    2. Are you having any other symptoms (ex. Dizziness, headache, blurred vision, passed out)? lightheadedness   3. What is your BP issue? Patient taken losartan (COZAAR) 100 MG tablet  Please advise.

## 2019-01-31 ENCOUNTER — Encounter: Payer: Self-pay | Admitting: Internal Medicine

## 2019-01-31 ENCOUNTER — Ambulatory Visit (INDEPENDENT_AMBULATORY_CARE_PROVIDER_SITE_OTHER): Payer: BC Managed Care – PPO | Admitting: Internal Medicine

## 2019-01-31 ENCOUNTER — Other Ambulatory Visit: Payer: Self-pay

## 2019-01-31 VITALS — BP 138/86 | HR 72 | Temp 97.8°F | Resp 16 | Ht 72.0 in | Wt 184.2 lb

## 2019-01-31 DIAGNOSIS — E781 Pure hyperglyceridemia: Secondary | ICD-10-CM | POA: Diagnosis not present

## 2019-01-31 DIAGNOSIS — I10 Essential (primary) hypertension: Secondary | ICD-10-CM | POA: Diagnosis not present

## 2019-01-31 MED ORDER — INDAPAMIDE 2.5 MG PO TABS: 2.5000 mg | ORAL_TABLET | Freq: Every day | ORAL | 1 refills | Status: DC

## 2019-01-31 NOTE — Patient Instructions (Signed)

## 2019-01-31 NOTE — Progress Notes (Signed)
Subjective:  Patient ID: Juan [name], male    DOB: Sep 01, 1955  Age: 64 y.o. MRN: [id number]  CC: Hypertension  This visit occurred during the SARS-CoV-2 public health emergency.  Safety protocols were in place, including screening questions prior to the visit, additional usage of staff PPE, and extensive cleaning of exam room while observing appropriate contact time as indicated for disinfecting solutions.    HPI Juan [name] presents for f/up - He complains that his blood pressure is not adequately well controlled.  It was recently as high as 150/98 and with that he felt dizzy and lightheaded.  He tells me he is compliant with the losartan and indapamide.  He has not been working on his lifestyle modifications and complains of weight gain.  Outpatient Medications Prior to Visit  Medication Sig Dispense Refill  . aspirin 81 MG tablet Take 81 mg by mouth daily.      Marland Kitchen atorvastatin (LIPITOR) 20 MG tablet Take 1 tablet (20 mg total) by mouth daily. 90 tablet 1  . B-D 3CC LUER-LOK SYR 18GX1-1/2 18G X 1-1/2" 3 ML MISC USE AS DIRECTED FOR TESTOSTERONE INJECTIONS 100 each 1  . B-D 3CC LUER-LOK SYR 23GX1" 23G X 1" 3 ML MISC   0  . BD DISP NEEDLE 23G X 1" MISC USE AS DIRECTED FOR TESTOSTERONE INJECTIONS 100 each 1  . BD DISP NEEDLES 18G X 1-1/2" MISC   0  . CELEBREX 200 MG capsule Take 200 mg by mouth daily as needed.    . DULoxetine (CYMBALTA) 60 MG capsule Take 1 capsule (60 mg total) by mouth daily. 90 capsule 1  . fish oil-omega-3 fatty acids 1000 MG capsule Take 2 g by mouth daily.      Marland Kitchen lidocaine (LIDODERM) 5 % Place 1 patch onto the skin every 12 (twelve) hours. Remove & Discard patch within 12 hours or as directed by MD 30 patch 2  . losartan (COZAAR) 100 MG tablet Take 1 tablet (100 mg total) by mouth daily. 90 tablet 0  . tadalafil (CIALIS) 5 MG tablet Take 1 tablet (5 mg total) by mouth daily as needed for erectile dysfunction. 30 tablet 5  . testosterone cypionate (DEPOTESTOSTERONE  CYPIONATE) 200 MG/ML injection Inject 1 mL (200 mg total) into the muscle every 14 (fourteen) days. 10 mL 0  . traMADol (ULTRAM) 50 MG tablet Take 50 mg by mouth daily as needed.    . traZODone (DESYREL) 50 MG tablet TAKE 1 TO 2 TABLETS BY MOUTH AT BEDTIME IF NEEDED 180 tablet 1  . indapamide (LOZOL) 1.25 MG tablet Take 1 tablet (1.25 mg total) by mouth daily. 90 tablet 0   No facility-administered medications prior to visit.    ROS Review of Systems  Constitutional: Positive for unexpected weight change. Negative for diaphoresis and fatigue.  HENT: Negative.   Eyes: Negative for visual disturbance.  Respiratory: Negative for cough, chest tightness, shortness of breath and wheezing.   Cardiovascular: Negative for chest pain, palpitations and leg swelling.  Gastrointestinal: Negative for abdominal pain, constipation, diarrhea, nausea and vomiting.  Endocrine: Negative.   Genitourinary: Negative.  Negative for difficulty urinating.  Musculoskeletal: Negative.  Negative for arthralgias and myalgias.  Skin: Negative.   Neurological: Positive for dizziness and light-headedness. Negative for weakness and headaches.  Hematological: Negative for adenopathy. Does not bruise/bleed easily.  Psychiatric/Behavioral: Negative.     Objective:  BP 138/86 (BP Location: Left Arm, Patient Position: Sitting, Cuff Size: Large)   Pulse 72   Temp  97.8 F (36.6 C) (Oral)   Resp 16   Ht 6' (1.829 m)   Wt 184 lb 4 oz (83.6 kg)   SpO2 96%   BMI 24.99 kg/m   BP Readings from Last 3 Encounters:  01/31/19 138/86  09/15/18 (!) 142/94  06/08/18 130/88    Wt Readings from Last 3 Encounters:  01/31/19 184 lb 4 oz (83.6 kg)  09/15/18 174 lb (78.9 kg)  06/08/18 176 lb (79.8 kg)    Physical Exam Vitals reviewed.  Constitutional:      Appearance: Normal appearance.  HENT:     Nose: Nose normal.     Mouth/Throat:     Mouth: Mucous membranes are moist.  Eyes:     General: No scleral icterus.     Conjunctiva/sclera: Conjunctivae normal.  Cardiovascular:     Rate and Rhythm: Normal rate and regular rhythm.     Heart sounds: No murmur.  Pulmonary:     Effort: Pulmonary effort is normal.     Breath sounds: No stridor. No wheezing, rhonchi or rales.  Abdominal:     General: Abdomen is flat.     Palpations: There is no mass.     Tenderness: There is no abdominal tenderness.  Musculoskeletal:        General: Normal range of motion.     Cervical back: Neck supple.     Right lower leg: No edema.     Left lower leg: No edema.  Lymphadenopathy:     Cervical: No cervical adenopathy.  Skin:    General: Skin is warm and dry.     Coloration: Skin is not pale.  Neurological:     General: No focal deficit present.     Mental Status: He is alert.     Lab Results  Component Value Date   WBC 7.2 01/31/2019   HGB 14.5 01/31/2019   HCT 42.2 01/31/2019   PLT 358.0 01/31/2019   GLUCOSE 132 (H) 01/31/2019   CHOL 165 02/28/2018   TRIG 180.0 (H) 01/31/2019   HDL 52.80 02/28/2018   LDLDIRECT 98.0 02/28/2018   LDLCALC 114 (H) 12/21/2016   ALT 37 02/28/2018   AST 23 02/28/2018   NA 136 01/31/2019   K 3.6 01/31/2019   CL 102 01/31/2019   CREATININE 0.93 01/31/2019   BUN 19 01/31/2019   CO2 28 01/31/2019   TSH 1.37 02/28/2018   PSA 0.24 02/28/2018   HGBA1C 5.3 07/13/2012    DG Chest 2 View  Result Date: 09/15/2018 CLINICAL DATA:  Cough EXAM: CHEST - 2 VIEW COMPARISON:  None. FINDINGS: The heart size and mediastinal contours are within normal limits. Both lungs are clear. The visualized skeletal structures are unremarkable. IMPRESSION: No active cardiopulmonary disease. Electronically Signed   By: Inez Catalina M.D.   On: 09/15/2018 09:48    Assessment & Plan:   Juan [name] was seen today for hypertension.  Diagnoses and all orders for this visit:  Essential hypertension- His blood pressure is not adequately well controlled and he is symptomatic.  His labs are negative for secondary  causes or endorgan damage.  I have asked him to increase the dose of indapamide and to stay on the current dose of losartan. -     CBC with Differential/Platelet -     Basic metabolic panel -     Urinalysis, Routine w reflex microscopic -     VITAMIN D 25 Hydroxy (Vit-D Deficiency, Fractures) -     indapamide (LOZOL) 2.5 MG tablet;  Take 1 tablet (2.5 mg total) by mouth daily.  Hyperglyceridemia, pure- Improvement noted. -     Triglycerides   I have discontinued Milbert Coulter Mcneece's indapamide. I am also having him start on indapamide. Additionally, I am having him maintain his fish oil-omega-3 fatty acids, aspirin, BD Disp Needles, B-D 3CC LUER-LOK SYR 23GX1", B-D 3CC LUER-LOK SYR 18GX1-1/2, BD Disp Needle, lidocaine, atorvastatin, DULoxetine, tadalafil, testosterone cypionate, losartan, traZODone, CeleBREX, and traMADol.  Meds ordered this encounter  Medications  . indapamide (LOZOL) 2.5 MG tablet    Sig: Take 1 tablet (2.5 mg total) by mouth daily.    Dispense:  90 tablet    Refill:  1     Follow-up: Return in about 3 months (around 05/01/2019).  Scarlette Calico, MD

## 2019-02-01 ENCOUNTER — Encounter: Payer: Self-pay | Admitting: Internal Medicine

## 2019-02-01 LAB — CBC WITH DIFFERENTIAL/PLATELET
Basophils Absolute: 0.1 10*3/uL (ref 0.0–0.1)
Basophils Relative: 1 % (ref 0.0–3.0)
Eosinophils Absolute: 0.2 10*3/uL (ref 0.0–0.7)
Eosinophils Relative: 2.3 % (ref 0.0–5.0)
HCT: 42.2 % (ref 39.0–52.0)
Hemoglobin: 14.5 g/dL (ref 13.0–17.0)
Lymphocytes Relative: 19.7 % (ref 12.0–46.0)
Lymphs Abs: 1.4 10*3/uL (ref 0.7–4.0)
MCHC: 34.4 g/dL (ref 30.0–36.0)
MCV: 88.4 fl (ref 78.0–100.0)
Monocytes Absolute: 0.6 10*3/uL (ref 0.1–1.0)
Monocytes Relative: 8.7 % (ref 3.0–12.0)
Neutro Abs: 4.9 10*3/uL (ref 1.4–7.7)
Neutrophils Relative %: 68.3 % (ref 43.0–77.0)
Platelets: 358 10*3/uL (ref 150.0–400.0)
RBC: 4.77 Mil/uL (ref 4.22–5.81)
RDW: 13.7 % (ref 11.5–15.5)
WBC: 7.2 10*3/uL (ref 4.0–10.5)

## 2019-02-01 LAB — BASIC METABOLIC PANEL
BUN: 19 mg/dL (ref 6–23)
CO2: 28 mEq/L (ref 19–32)
Calcium: 9.2 mg/dL (ref 8.4–10.5)
Chloride: 102 mEq/L (ref 96–112)
Creatinine, Ser: 0.93 mg/dL (ref 0.40–1.50)
GFR: 81.86 mL/min (ref >60.00)
Glucose, Bld: 132 mg/dL — ABNORMAL HIGH (ref 70–99)
Potassium: 3.6 mEq/L (ref 3.5–5.1)
Sodium: 136 mEq/L (ref 135–145)

## 2019-02-01 LAB — URINALYSIS, ROUTINE W REFLEX MICROSCOPIC
Bilirubin Urine: NEGATIVE
Ketones, ur: NEGATIVE
Leukocytes,Ua: NEGATIVE
Nitrite: NEGATIVE
Specific Gravity, Urine: 1.025 (ref 1.000–1.030)
Total Protein, Urine: NEGATIVE
Urine Glucose: NEGATIVE
Urobilinogen, UA: 0.2 (ref 0.0–1.0)
WBC, UA: NONE SEEN (ref 0–?)
pH: 6 (ref 5.0–8.0)

## 2019-02-01 LAB — VITAMIN D 25 HYDROXY (VIT D DEFICIENCY, FRACTURES): VITD: 43.91 ng/mL (ref 30.00–100.00)

## 2019-02-01 LAB — TRIGLYCERIDES: Triglycerides: 180 mg/dL — ABNORMAL HIGH (ref 0.0–149.0)

## 2019-02-16 ENCOUNTER — Telehealth: Payer: Self-pay

## 2019-02-16 NOTE — Telephone Encounter (Signed)
New message  Pt c/o BP issue: STAT if pt c/o blurred vision, one-sided weakness or slurred speech  1. What are your last BP readings?   Today 150/96 @ 10 min ago   Couple of day  140/87   2. Are you having any other symptoms (ex. Dizziness, headache, blurred vision, passed out)? Headache   3. What is your BP issue? Discuss with the nurse.

## 2019-02-17 ENCOUNTER — Other Ambulatory Visit: Payer: Self-pay | Admitting: Internal Medicine

## 2019-02-17 DIAGNOSIS — I10 Essential (primary) hypertension: Secondary | ICD-10-CM

## 2019-02-17 MED ORDER — CARVEDILOL 3.125 MG PO TABS: 3.1250 mg | ORAL_TABLET | Freq: Two times a day (BID) | ORAL | 2 refills | Status: DC

## 2019-02-17 NOTE — Telephone Encounter (Signed)
Pt contacted and he stated that he is checking his bp in the afternoon. Offered pt an appointment. He stated that it is difficult to get off of work to come in.   Pt stated that the indapamide was increased to 2.5mg  to help lower the bp.   Pt states that he has had a headache for the last several months and is concerned that his blood pressure is causing.   Please advise - will call pt to come back for an appointment. (pt wanted me to run this information by you before scheduling).

## 2019-02-17 NOTE — Telephone Encounter (Signed)
Pt contacted and informed of same.  

## 2019-02-19 ENCOUNTER — Other Ambulatory Visit: Payer: Self-pay | Admitting: Internal Medicine

## 2019-02-19 DIAGNOSIS — E785 Hyperlipidemia, unspecified: Secondary | ICD-10-CM

## 2019-03-07 ENCOUNTER — Other Ambulatory Visit: Payer: Self-pay | Admitting: Internal Medicine

## 2019-03-07 DIAGNOSIS — I10 Essential (primary) hypertension: Secondary | ICD-10-CM

## 2019-03-07 MED ORDER — LOSARTAN POTASSIUM 100 MG PO TABS: 100.0000 mg | ORAL_TABLET | Freq: Every day | ORAL | 0 refills | Status: DC

## 2019-03-24 ENCOUNTER — Other Ambulatory Visit: Payer: Self-pay | Admitting: Internal Medicine

## 2019-03-24 DIAGNOSIS — N529 Male erectile dysfunction, unspecified: Secondary | ICD-10-CM

## 2019-03-24 DIAGNOSIS — F320 Major depressive disorder, single episode, mild: Secondary | ICD-10-CM

## 2019-03-24 DIAGNOSIS — M170 Bilateral primary osteoarthritis of knee: Secondary | ICD-10-CM

## 2019-04-17 ENCOUNTER — Telehealth: Payer: Self-pay

## 2019-04-17 ENCOUNTER — Other Ambulatory Visit: Payer: Self-pay | Admitting: Internal Medicine

## 2019-04-17 DIAGNOSIS — M5126 Other intervertebral disc displacement, lumbar region: Secondary | ICD-10-CM

## 2019-04-17 MED ORDER — TIZANIDINE HCL 4 MG PO CAPS: 4.0000 mg | ORAL_CAPSULE | Freq: Three times a day (TID) | ORAL | 1 refills | Status: DC | PRN

## 2019-04-17 NOTE — Telephone Encounter (Signed)
Pt informed rx for tizanidine has been sent in. Pt stated understanding and appreciation.

## 2019-04-17 NOTE — Telephone Encounter (Signed)
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 Ronnald Ramp prescribe him something.   I offer an appt to see the MD on 4.13.21    The Patient declined appt asking the CMA to call him back he does not want to wait that long.

## 2019-04-24 ENCOUNTER — Other Ambulatory Visit: Payer: Self-pay | Admitting: Internal Medicine

## 2019-04-24 DIAGNOSIS — N529 Male erectile dysfunction, unspecified: Secondary | ICD-10-CM

## 2019-05-17 DIAGNOSIS — M1711 Unilateral primary osteoarthritis, right knee: Secondary | ICD-10-CM | POA: Diagnosis not present

## 2019-05-23 ENCOUNTER — Other Ambulatory Visit: Payer: Self-pay | Admitting: Internal Medicine

## 2019-05-23 DIAGNOSIS — I10 Essential (primary) hypertension: Secondary | ICD-10-CM

## 2019-05-31 ENCOUNTER — Other Ambulatory Visit: Payer: Self-pay | Admitting: Internal Medicine

## 2019-05-31 DIAGNOSIS — E291 Testicular hypofunction: Secondary | ICD-10-CM

## 2019-06-14 ENCOUNTER — Encounter: Payer: Self-pay | Admitting: Internal Medicine

## 2019-06-14 ENCOUNTER — Ambulatory Visit (INDEPENDENT_AMBULATORY_CARE_PROVIDER_SITE_OTHER): Payer: BC Managed Care – PPO | Admitting: Internal Medicine

## 2019-06-14 ENCOUNTER — Ambulatory Visit (INDEPENDENT_AMBULATORY_CARE_PROVIDER_SITE_OTHER): Payer: BC Managed Care – PPO

## 2019-06-14 ENCOUNTER — Other Ambulatory Visit: Payer: Self-pay

## 2019-06-14 VITALS — BP 122/78 | HR 61 | Temp 98.0°F | Ht 72.0 in | Wt 179.0 lb

## 2019-06-14 DIAGNOSIS — Z23 Encounter for immunization: Secondary | ICD-10-CM | POA: Diagnosis not present

## 2019-06-14 DIAGNOSIS — Z125 Encounter for screening for malignant neoplasm of prostate: Secondary | ICD-10-CM | POA: Diagnosis not present

## 2019-06-14 DIAGNOSIS — I10 Essential (primary) hypertension: Secondary | ICD-10-CM | POA: Diagnosis not present

## 2019-06-14 DIAGNOSIS — R079 Chest pain, unspecified: Secondary | ICD-10-CM

## 2019-06-14 DIAGNOSIS — R059 Cough, unspecified: Secondary | ICD-10-CM

## 2019-06-14 DIAGNOSIS — R05 Cough: Secondary | ICD-10-CM

## 2019-06-14 DIAGNOSIS — Z0000 Encounter for general adult medical examination without abnormal findings: Secondary | ICD-10-CM

## 2019-06-14 DIAGNOSIS — E785 Hyperlipidemia, unspecified: Secondary | ICD-10-CM | POA: Diagnosis not present

## 2019-06-14 DIAGNOSIS — E291 Testicular hypofunction: Secondary | ICD-10-CM | POA: Diagnosis not present

## 2019-06-14 DIAGNOSIS — E781 Pure hyperglyceridemia: Secondary | ICD-10-CM

## 2019-06-14 DIAGNOSIS — R0789 Other chest pain: Secondary | ICD-10-CM

## 2019-06-14 LAB — CBC WITH DIFFERENTIAL/PLATELET
Basophils Absolute: 0.1 10*3/uL (ref 0.0–0.1)
Basophils Relative: 1.1 % (ref 0.0–3.0)
Eosinophils Absolute: 0.2 10*3/uL (ref 0.0–0.7)
Eosinophils Relative: 5.1 % — ABNORMAL HIGH (ref 0.0–5.0)
HCT: 41.3 % (ref 39.0–52.0)
Hemoglobin: 14.3 g/dL (ref 13.0–17.0)
Lymphocytes Relative: 27 % (ref 12.0–46.0)
Lymphs Abs: 1.2 10*3/uL (ref 0.7–4.0)
MCHC: 34.6 g/dL (ref 30.0–36.0)
MCV: 87.8 fl (ref 78.0–100.0)
Monocytes Absolute: 0.6 10*3/uL (ref 0.1–1.0)
Monocytes Relative: 12.3 % — ABNORMAL HIGH (ref 3.0–12.0)
Neutro Abs: 2.5 10*3/uL (ref 1.4–7.7)
Neutrophils Relative %: 54.5 % (ref 43.0–77.0)
Platelets: 355 10*3/uL (ref 150.0–400.0)
RBC: 4.7 Mil/uL (ref 4.22–5.81)
RDW: 13.9 % (ref 11.5–15.5)
WBC: 4.5 10*3/uL (ref 4.0–10.5)

## 2019-06-14 LAB — HEPATIC FUNCTION PANEL
ALT: 27 U/L (ref 0–53)
AST: 20 U/L (ref 0–37)
Albumin: 4.3 g/dL (ref 3.5–5.2)
Alkaline Phosphatase: 29 U/L — ABNORMAL LOW (ref 39–117)
Bilirubin, Direct: 0.1 mg/dL (ref 0.0–0.3)
Total Bilirubin: 0.5 mg/dL (ref 0.2–1.2)
Total Protein: 6.6 g/dL (ref 6.0–8.3)

## 2019-06-14 LAB — BASIC METABOLIC PANEL
BUN: 22 mg/dL (ref 6–23)
CO2: 30 mEq/L (ref 19–32)
Calcium: 9.6 mg/dL (ref 8.4–10.5)
Chloride: 101 mEq/L (ref 96–112)
Creatinine, Ser: 0.99 mg/dL (ref 0.40–1.50)
GFR: 76.08 mL/min (ref >60.00)
Glucose, Bld: 101 mg/dL — ABNORMAL HIGH (ref 70–99)
Potassium: 4 mEq/L (ref 3.5–5.1)
Sodium: 138 mEq/L (ref 135–145)

## 2019-06-14 LAB — LIPID PANEL
Cholesterol: 149 mg/dL (ref 0–200)
HDL: 48.8 mg/dL (ref >39.00)
LDL Cholesterol: 79 mg/dL (ref 0–99)
NonHDL: 100.13
Total CHOL/HDL Ratio: 3
Triglycerides: 105 mg/dL (ref 0.0–149.0)
VLDL: 21 mg/dL (ref 0.0–40.0)

## 2019-06-14 LAB — PSA: PSA: 0.2 ng/mL (ref 0.10–4.00)

## 2019-06-14 LAB — TSH: TSH: 0.78 u[IU]/mL (ref 0.35–4.50)

## 2019-06-14 NOTE — Progress Notes (Signed)
Subjective:  Patient ID: Juan [name], male    DOB: 1955/01/14  Age: 64 y.o. MRN: [id number]  CC: Annual Exam, Hypertension, and Hyperlipidemia  This visit occurred during the SARS-CoV-2 public health emergency.  Safety protocols were in place, including screening questions prior to the visit, additional usage of staff PPE, and extensive cleaning of exam room while observing appropriate contact time as indicated for disinfecting solutions.    HPI [name] presents for a CPX.  He reports chronic, intermittent, recurrent symptoms.  He describes a pain under his sternum that occurs at rest.  Sometimes it is a tightness, sometimes it is crampy, sometimes it is a soreness.  The pain is not worsened by activity.  He denies DOE or diaphoresis.  He has a chronic cough which is rarely productive of clear phlegm.  He denies hemoptysis, fatigue, or weight loss.  He tells me his blood pressure is adequately well controlled and he has had no recent headache episodes of headache or blurred vision.  Outpatient Medications Prior to Visit  Medication Sig Dispense Refill   aspirin 81 MG tablet Take 81 mg by mouth daily.       atorvastatin (LIPITOR) 20 MG tablet TAKE 1 TABLET(20 MG) BY MOUTH DAILY 90 tablet 1   B-D 3CC LUER-LOK SYR 18GX1-1/2 18G X 1-1/2" 3 ML MISC USE AS DIRECTED FOR TESTOSTERONE INJECTIONS 100 each 1   B-D 3CC LUER-LOK SYR 23GX1" 23G X 1" 3 ML MISC   0   BD DISP NEEDLE 23G X 1" MISC USE AS DIRECTED FOR TESTOSTERONE INJECTIONS 100 each 1   BD DISP NEEDLES 18G X 1-1/2" MISC   0   carvedilol (COREG) 3.125 MG tablet TAKE 1 TABLET(3.125 MG) BY MOUTH TWICE DAILY WITH A MEAL 60 tablet 2   CELEBREX 200 MG capsule Take 200 mg by mouth daily as needed.     DULoxetine (CYMBALTA) 60 MG capsule TAKE 1 CAPSULE(60 MG) BY MOUTH DAILY 90 capsule 1   fish oil-omega-3 fatty acids 1000 MG capsule Take 2 g by mouth daily.       indapamide (LOZOL) 2.5 MG tablet Take 1 tablet (2.5 mg total) by  mouth daily. 90 tablet 1   lidocaine (LIDODERM) 5 % Place 1 patch onto the skin every 12 (twelve) hours. Remove & Discard patch within 12 hours or as directed by MD 30 patch 2   losartan (COZAAR) 100 MG tablet Take 1 tablet (100 mg total) by mouth daily. 90 tablet 0   tadalafil (CIALIS) 5 MG tablet TAKE 1 TABLET(5 MG) BY MOUTH DAILY AS NEEDED FOR ERECTILE DYSFUNCTION 90 tablet 1   testosterone cypionate (DEPOTESTOSTERONE CYPIONATE) 200 MG/ML injection Inject 1 mL (200 mg total) into the muscle every 14 (fourteen) days. 10 mL 0   tiZANidine (ZANAFLEX) 4 MG capsule Take 1 capsule (4 mg total) by mouth 3 (three) times daily as needed for muscle spasms. 90 capsule 1   traMADol (ULTRAM) 50 MG tablet Take 50 mg by mouth daily as needed.     traZODone (DESYREL) 50 MG tablet TAKE 1 TO 2 TABLETS BY MOUTH AT BEDTIME IF NEEDED 180 tablet 1   No facility-administered medications prior to visit.    ROS Review of Systems  Constitutional: Negative for chills, diaphoresis, fatigue and unexpected weight change.  HENT: Negative.  Negative for sore throat and trouble swallowing.   Respiratory: Positive for cough and chest tightness. Negative for shortness of breath, wheezing and stridor.   Cardiovascular: Positive for chest  pain. Negative for palpitations and leg swelling.  Gastrointestinal: Negative for abdominal pain, constipation, diarrhea, nausea and vomiting.  Genitourinary: Negative.  Negative for difficulty urinating, discharge, dysuria, scrotal swelling, testicular pain and urgency.  Musculoskeletal: Positive for arthralgias and back pain. Negative for myalgias and neck pain.  Skin: Negative.  Negative for color change, pallor and rash.  Neurological: Negative.  Negative for dizziness, weakness, light-headedness and numbness.  Hematological: Negative for adenopathy. Does not bruise/bleed easily.  Psychiatric/Behavioral: Negative.     Objective:  BP 122/78 (BP Location: Left Arm, Patient  Position: Sitting, Cuff Size: Normal)    Pulse 61    Temp 98 F (36.7 C) (Oral)    Ht 6' (1.829 m)    Wt 179 lb (81.2 kg)    SpO2 98%    BMI 24.28 kg/m   BP Readings from Last 3 Encounters:  06/14/19 122/78  01/31/19 138/86  09/15/18 (!) 142/94    Wt Readings from Last 3 Encounters:  06/14/19 179 lb (81.2 kg)  01/31/19 184 lb 4 oz (83.6 kg)  09/15/18 174 lb (78.9 kg)    Physical Exam Vitals reviewed. Exam conducted with a chaperone present Everette Rank).  Constitutional:      Appearance: Normal appearance.  HENT:     Nose: Nose normal.     Mouth/Throat:     Mouth: Mucous membranes are moist.  Eyes:     General: No scleral icterus.    Conjunctiva/sclera: Conjunctivae normal.  Cardiovascular:     Rate and Rhythm: Regular rhythm. Bradycardia present.     Heart sounds: No murmur heard.  No friction rub. No gallop.      Comments: EKG - Sinus bradycardia, 1st degree AV block Otherwise normal EKG Pulmonary:     Effort: Pulmonary effort is normal.     Breath sounds: No stridor. No wheezing, rhonchi or rales.  Abdominal:     General: Abdomen is flat. Bowel sounds are normal. There is no distension.     Palpations: Abdomen is soft. There is no hepatomegaly, splenomegaly or mass.     Tenderness: There is no abdominal tenderness.     Hernia: No hernia is present. There is no hernia in the left inguinal area or right inguinal area.  Genitourinary:    Pubic Area: No rash.      Penis: Normal and circumcised. No discharge, swelling or lesions.      Testes: Normal.        Right: Mass, tenderness or swelling not present.        Left: Mass, tenderness or swelling not present.     Epididymis:     Right: Normal. Not inflamed or enlarged. No mass.     Left: Normal. Not inflamed or enlarged. No mass.     Prostate: Enlarged (1+ smooth symm BPH). Not tender and no nodules present.     Rectum: Normal. Guaiac result negative. No mass, tenderness, anal fissure, external hemorrhoid or  internal hemorrhoid. Normal anal tone.  Musculoskeletal:        General: Normal range of motion.     Cervical back: Neck supple.     Right lower leg: No edema.     Left lower leg: No edema.  Lymphadenopathy:     Cervical: No cervical adenopathy.     Lower Body: No right inguinal adenopathy. No left inguinal adenopathy.  Skin:    General: Skin is warm and dry.     Coloration: Skin is not pale.  Neurological:  General: No focal deficit present.     Mental Status: He is alert.  Psychiatric:        Mood and Affect: Mood normal.        Behavior: Behavior normal.     Lab Results  Component Value Date   WBC 4.5 06/14/2019   HGB 14.3 06/14/2019   HCT 41.3 06/14/2019   PLT 355.0 06/14/2019   GLUCOSE 101 (H) 06/14/2019   CHOL 149 06/14/2019   TRIG 105.0 06/14/2019   HDL 48.80 06/14/2019   LDLDIRECT 98.0 02/28/2018   LDLCALC 79 06/14/2019   ALT 27 06/14/2019   AST 20 06/14/2019   NA 138 06/14/2019   K 4.0 06/14/2019   CL 101 06/14/2019   CREATININE 0.99 06/14/2019   BUN 22 06/14/2019   CO2 30 06/14/2019   TSH 0.78 06/14/2019   PSA 0.20 06/14/2019   HGBA1C 5.3 07/13/2012    DG Chest 2 View  Result Date: 09/15/2018 CLINICAL DATA:  Cough EXAM: CHEST - 2 VIEW COMPARISON:  None. FINDINGS: The heart size and mediastinal contours are within normal limits. Both lungs are clear. The visualized skeletal structures are unremarkable. IMPRESSION: No active cardiopulmonary disease. Electronically Signed   By: Inez Catalina M.D.   On: 09/15/2018 09:48   DG Chest 2 View  Result Date: 06/14/2019 CLINICAL DATA:  Cough and chest tightness EXAM: CHEST - 2 VIEW COMPARISON:  09/15/2018 FINDINGS: Cardiac shadow is stable. Aortic calcifications are again noted. Lungs are well aerated without focal infiltrate or sizable effusion. No acute bony abnormality is seen. IMPRESSION: No active cardiopulmonary disease. Aortic Atherosclerosis (ICD10-I70.0). Electronically Signed   By: Inez Catalina M.D.   On:  06/14/2019 17:12    Assessment & Plan:   Macdonald was seen today for annual exam, hypertension and hyperlipidemia.  Diagnoses and all orders for this visit:  Essential hypertension- His blood pressure is adequately well controlled.  Electrolytes and renal function are normal.  Will continue the current antihypertensive regimen. -     Basic metabolic panel; Future -     TSH; Future -     EKG 12-Lead -     TSH -     Basic metabolic panel  Hyperlipidemia LDL goal <130- He has achieved his LDL goal and is doing well on the statin. -     TSH; Future -     Hepatic function panel; Future -     Hepatic function panel -     TSH  HYPERTRIGLYCERIDEMIA- His triglycerides are normal now. -     Hepatic function panel; Future -     Hepatic function panel  Routine general medical examination at a health care facility- Exam completed, labs reviewed, vaccines reviewed and updated, cancer screenings are up-to-date, patient education material was given. -     Lipid panel; Future -     PSA; Future -     PSA -     Lipid panel  Hypogonadism male- His testosterone is adequately repleted.  Labs are negative for complications or toxicity related to testosterone replacement therapy. -     CBC with Differential/Platelet; Future -     Testosterone Total,Free,Bio, Males; Future -     Testosterone Total,Free,Bio, Males -     CBC with Differential/Platelet  Need for Tdap vaccination -     Tdap vaccine greater than or equal to 7yo IM  Need for shingles vaccine -     Varicella-zoster vaccine IM (Shingrix)  Cough- His exam and chest x-ray  are reassuring.  He is a prior smoker.  I think he has mild chronic bronchitis.  He does not wish to use an inhaler to treat this. -     DG Chest 2 View; Future  Chest pain at rest- He has atypical chest pain.  He has a family history of CAD and other risk factors for CAD.  I have asked him to undergo a CT cardiac scoring to screen for atherosclerosis. -     CT CARDIAC  SCORING; Future  Other chest pain -     CT CARDIAC SCORING; Future   I am having Lamar Sprinkles maintain his fish oil-omega-3 fatty acids, aspirin, BD Disp Needles, B-D 3CC LUER-LOK SYR 23GX1", B-D 3CC LUER-LOK SYR 18GX1-1/2, BD Disp Needle, lidocaine, testosterone cypionate, traZODone, CeleBREX, traMADol, indapamide, atorvastatin, losartan, DULoxetine, tiZANidine, tadalafil, and carvedilol.  No orders of the defined types were placed in this encounter.  In addition to time spent on CPE, I spent 50 minutes in preparing to see the patient by review of recent labs, imaging and procedures, obtaining and reviewing separately obtained history, communicating with the patient and family or caregiver, ordering medications, tests or procedures, and documenting clinical information in the EHR including the differential Dx, treatment, and any further evaluation and other management of 1. Essential hypertension 2. Hyperlipidemia LDL goal <130 3. HYPERTRIGLYCERIDEMIA 4. Hypogonadism male 5. Cough 6. Chest pain at rest 7. Other chest pain     Follow-up: Return in about 6 months (around 12/14/2019).  Scarlette Calico, MD

## 2019-06-14 NOTE — Patient Instructions (Signed)

## 2019-06-15 LAB — TESTOSTERONE TOTAL,FREE,BIO, MALES
Albumin: 4.3 g/dL (ref 3.6–5.1)
Sex Hormone Binding: 47 nmol/L (ref 22–77)
Testosterone, Bioavailable: 182.6 ng/dL (ref >=110.0)
Testosterone, Free: 92.7 pg/mL (ref 46.0–224.0)
Testosterone: 842 ng/dL — ABNORMAL HIGH (ref 250–827)

## 2019-06-20 ENCOUNTER — Other Ambulatory Visit: Payer: Self-pay | Admitting: Internal Medicine

## 2019-06-20 DIAGNOSIS — E291 Testicular hypofunction: Secondary | ICD-10-CM

## 2019-06-20 MED ORDER — TESTOSTERONE CYPIONATE 200 MG/ML IM SOLN: 200.0000 mg | INTRAMUSCULAR | 1 refills | Status: DC

## 2019-06-23 ENCOUNTER — Other Ambulatory Visit: Payer: Self-pay

## 2019-06-23 ENCOUNTER — Ambulatory Visit (INDEPENDENT_AMBULATORY_CARE_PROVIDER_SITE_OTHER)
Admission: RE | Admit: 2019-06-23 | Discharge: 2019-06-23 | Disposition: A | Discharge status: Home / Self Care | Payer: Self-pay | Source: Ambulatory Visit | Attending: Internal Medicine | Admitting: Internal Medicine

## 2019-06-23 DIAGNOSIS — R0789 Other chest pain: Secondary | ICD-10-CM

## 2019-06-23 DIAGNOSIS — I700 Atherosclerosis of aorta: Secondary | ICD-10-CM | POA: Diagnosis not present

## 2019-06-23 DIAGNOSIS — R079 Chest pain, unspecified: Secondary | ICD-10-CM

## 2019-06-25 ENCOUNTER — Other Ambulatory Visit: Payer: Self-pay | Admitting: Internal Medicine

## 2019-06-25 DIAGNOSIS — I10 Essential (primary) hypertension: Secondary | ICD-10-CM

## 2019-06-25 DIAGNOSIS — I2510 Atherosclerotic heart disease of native coronary artery without angina pectoris: Secondary | ICD-10-CM | POA: Insufficient documentation

## 2019-06-25 MED ORDER — ASPIRIN EC 81 MG PO TBEC: 81.0000 mg | DELAYED_RELEASE_TABLET | Freq: Every day | ORAL | 3 refills | Status: DC

## 2019-07-18 ENCOUNTER — Other Ambulatory Visit: Payer: Self-pay

## 2019-07-18 ENCOUNTER — Encounter: Payer: Self-pay | Admitting: Cardiology

## 2019-07-18 ENCOUNTER — Ambulatory Visit (INDEPENDENT_AMBULATORY_CARE_PROVIDER_SITE_OTHER): Payer: BC Managed Care – PPO | Admitting: Cardiology

## 2019-07-18 VITALS — BP 119/79 | HR 63 | Ht 72.0 in | Wt 180.0 lb

## 2019-07-18 DIAGNOSIS — E785 Hyperlipidemia, unspecified: Secondary | ICD-10-CM | POA: Diagnosis not present

## 2019-07-18 DIAGNOSIS — I2510 Atherosclerotic heart disease of native coronary artery without angina pectoris: Secondary | ICD-10-CM

## 2019-07-18 DIAGNOSIS — I10 Essential (primary) hypertension: Secondary | ICD-10-CM | POA: Diagnosis not present

## 2019-07-18 DIAGNOSIS — R079 Chest pain, unspecified: Secondary | ICD-10-CM | POA: Diagnosis not present

## 2019-07-18 MED ORDER — ATORVASTATIN CALCIUM 40 MG PO TABS: 40.0000 mg | ORAL_TABLET | Freq: Every day | ORAL | 3 refills | Status: DC

## 2019-07-18 NOTE — Patient Instructions (Signed)
Medication Instructions:  INCREASE atorvastatin (Lipitor) to 40 mg daily  *If you need a refill on your cardiac medications before your next appointment, please call your pharmacy*  Testing/Procedures: Your physician has requested that you have an echocardiogram. Echocardiography is a painless test that uses sound waves to create images of your heart. It provides your doctor with information about the size and shape of your heart and how well your hearts chambers and valves are working. This procedure takes approximately one hour. There are no restrictions for this procedure. This will be done at our [facility] location:  [place] has requested that you have a lexiscan myoview. For further information please visit [email or web address]. Please follow instruction sheet, as given.  Follow-Up: At [facility], you and your health needs are our priority.  As part of our continuing mission to provide you with exceptional heart care, we have created designated Provider Care Teams.  These Care Teams include your primary Cardiologist (physician) and Advanced Practice Providers (APPs -  Physician Assistants and Nurse Practitioners) who all work together to provide you with the care you need, when you need it.  We recommend signing up for the patient portal called "MyChart".  Sign up information is provided on this After Visit Summary.  MyChart is used to connect with patients for Virtual Visits (Telemedicine).  Patients are able to view lab/test results, encounter notes, upcoming appointments, etc.  Non-urgent messages can be sent to your provider as well.   To learn more about what you can do with MyChart, go to [email or web address].    Your next appointment:   2 month(s)  The format for your next appointment:   In Person  Provider:   Oswaldo Milian, MD

## 2019-07-18 NOTE — Progress Notes (Signed)
Cardiology Office Note:    Date:  07/18/2019   ID:  Juan [name], DOB Dec 01, 1955, MRN [id number]  PCP:  Janith Lima, MD  Cardiologist:  No primary care provider on file.  Electrophysiologist:  None   Referring MD: Janith Lima, MD   Chief Complaint  Patient presents with  . Coronary Artery Disease   History of Present Illness:    Juan [name] is a 65 y.o. male with a hx of hypertension, hyperlipidemia who is referred by Dr. Ronnald Ramp for evaluation of CAD.  He underwent calcium score on 06/23/2019, which was 1716 (97th percentile).  He reports he has been having chest pain.  Occurs about once per month.  Describes as tightness in center of his chest.  Has not noted a relationship with exertion, can occur at rest.  States it is 2-3 out of 10 in intensity and can last for several hours.  For exercise he does yoga and lifts weights about once per week.  Used to go for walks but not recently he is having knee pain and is being evaluated for knee replacement.  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  Reports he has had some fatigue dyspnea with exertion.  He denies any lightheadedness, syncope, lower extremity edema.  Reports his BP has been under good control recently. 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  Quit cigarettes 18 years ago.  Father had CABG in early 95s.    Past Medical History:  Diagnosis Date  . Hypertension   . Personal history of colonic polyps - adenoma 04/24/2008   04/2008 - diminutive adenoma 06/13/2013      Past Surgical History:  Procedure Laterality Date  . APPENDECTOMY  2007   ruptured  . COLONOSCOPY      Current Medications: Current Meds  Medication Sig  . aspirin EC 81 MG tablet Take 1 tablet (81 mg total) by mouth daily.  Marland Kitchen atorvastatin (LIPITOR) 40 MG tablet Take 1 tablet (40 mg total) by mouth daily.  . B-D 3CC LUER-LOK SYR 18GX1-1/2 18G X 1-1/2" 3 ML MISC USE AS DIRECTED FOR TESTOSTERONE INJECTIONS  . B-D 3CC  LUER-LOK SYR 23GX1" 23G X 1" 3 ML MISC   . BD DISP NEEDLE 23G X 1" MISC USE AS DIRECTED FOR TESTOSTERONE INJECTIONS  . BD DISP NEEDLES 18G X 1-1/2" MISC   . carvedilol (COREG) 3.125 MG tablet TAKE 1 TABLET(3.125 MG) BY MOUTH TWICE DAILY WITH A MEAL  . CELEBREX 200 MG capsule Take 200 mg by mouth daily as needed.  . DULoxetine (CYMBALTA) 60 MG capsule TAKE 1 CAPSULE(60 MG) BY MOUTH DAILY  . fish oil-omega-3 fatty acids 1000 MG capsule Take 2 g by mouth daily.    . indapamide (LOZOL) 2.5 MG tablet Take 1 tablet (2.5 mg total) by mouth daily.  Marland Kitchen lidocaine (LIDODERM) 5 % Place 1 patch onto the skin every 12 (twelve) hours. Remove & Discard patch within 12 hours or as directed by MD  . losartan (COZAAR) 100 MG tablet TAKE 1 TABLET(100 MG) BY MOUTH DAILY  . tadalafil (CIALIS) 5 MG tablet TAKE 1 TABLET(5 MG) BY MOUTH DAILY AS NEEDED FOR ERECTILE DYSFUNCTION  . testosterone cypionate (DEPOTESTOSTERONE CYPIONATE) 200 MG/ML injection Inject 1 mL (200 mg total) into the muscle every 14 (fourteen) days.  Marland Kitchen tiZANidine (ZANAFLEX) 4 MG capsule Take 1 capsule (4 mg total) by mouth 3 (three) times daily as needed for muscle spasms.  Marland Kitchen  traMADol (ULTRAM) 50 MG tablet Take 50 mg by mouth daily as needed.  . traZODone (DESYREL) 50 MG tablet TAKE 1 TO 2 TABLETS BY MOUTH AT BEDTIME IF NEEDED  . [DISCONTINUED] atorvastatin (LIPITOR) 20 MG tablet TAKE 1 TABLET(20 MG) BY MOUTH DAILY     Allergies:   Ace inhibitors   Social History   Socioeconomic History  . Marital status: Married    Spouse name: Not on file  . Number of children: Not on file  . Years of education: Not on file  . Highest education level: Not on file  Occupational History  . Not on file  Tobacco Use  . Smoking status: Former Smoker    Quit date: 01/06/1999    Years since quitting: 20.5  . Smokeless tobacco: Never Used  Substance and Sexual Activity  . Alcohol use: No  . 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 Sexual activity: Yes  Other Topics Concern  . Not on file  Social History Narrative   Regular exercise-Yes   He did rehab 12 years ago and has screened several times for HIV and Hep A/B/C and always negative.   Social Determinants of Health   Financial Resource Strain:   . Difficulty of Paying Living Expenses:   Food Insecurity:   . Worried About Charity fundraiser in the Last Year:   . Arboriculturist in the Last Year:   Transportation Needs:   . Film/video editor (Medical):   Marland Kitchen Lack of Transportation (Non-Medical):   Physical Activity:   . Days of Exercise per Week:   . Minutes of Exercise per Session:   Stress:   . Feeling of Stress :   Social Connections:   . Frequency of Communication with Friends and Family:   . Frequency of Social Gatherings with Friends and Family:   . Attends Religious Services:   . Active Member of Clubs or Organizations:   . Attends Archivist Meetings:   Marland Kitchen Marital Status:      Family History: The patient's family history includes Heart disease in his father; Prostate cancer in his father. There is no history of Colon cancer.  ROS:   Please see the history of present illness.     All other systems reviewed and are negative.  EKGs/Labs/Other Studies Reviewed:    The following studies were reviewed today:   EKG:  EKG is ordered today.  The ekg ordered today demonstrates normal sinus rhythm, incomplete right bundle branch block, rate 60, no ST abnormalities  Recent Labs: 06/14/2019: ALT 27; BUN 22; Creatinine, Ser 0.99; Hemoglobin 14.3; Platelets 355.0; Potassium 4.0; Sodium 138; TSH 0.78  Recent Lipid Panel    Component Value Date/Time   CHOL 149 06/14/2019 0931   TRIG 105.0 06/14/2019 0931   HDL 48.80 06/14/2019 0931   CHOLHDL 3 06/14/2019 0931   VLDL 21.0 06/14/2019 0931   LDLCALC 79 06/14/2019 0931   LDLDIRECT 98.0 02/28/2018 1546    Physical Exam:    VS:  BP 119/79   Pulse 63   Ht 6' (1.829 m)    Wt 180 lb (81.6 kg)   SpO2 97%   BMI 24.41 kg/m     Wt Readings from Last 3 Encounters:  07/18/19 180 lb (81.6 kg)  06/14/19 179 lb (81.2 kg)  01/31/19 184 lb 4 oz (83.6 kg)     GEN: Well nourished, well developed in  no acute distress HEENT: Normal NECK: No JVD; No carotid bruits LYMPHATICS: No lymphadenopathy CARDIAC: RRR, no murmurs, rubs, gallops RESPIRATORY:  Clear to auscultation without rales, wheezing or rhonchi  ABDOMEN: Soft, non-tender, non-distended MUSCULOSKELETAL:  No edema; No deformity  SKIN: Warm and dry NEUROLOGIC:  Alert and oriented x 3 PSYCHIATRIC:  Normal affect   ASSESSMENT:    1. Chest pain of uncertain etiology   2. Coronary artery disease involving native coronary artery of native heart, angina presence unspecified   3. Essential hypertension   4. Hyperlipidemia, unspecified hyperlipidemia type    PLAN:    CAD: calcium score on 6/18/2021was 1716 (97th percentile).  He is having atypical chest pain. -Lexiscan Myoview -Echocardiogram -Increase atorvastatin to 40 mg daily  Hyperlipidemia: On atorvastatin 20 mg daily.  LDL 79 on 06/14/2019.  Will increase atorvastatin to 40 mg daily to target goal LDL less than 70  Hypertension: On carvedilol 3.25 mg twice daily, losartan 100 mg daily, and indapamide.  Appears controlled  RTC in 2 months   Medication Adjustments/Labs and Tests Ordered: Current medicines are reviewed at length with the patient today.  Concerns regarding medicines are outlined above.  Orders Placed This Encounter  Procedures  . MYOCARDIAL PERFUSION IMAGING  . EKG 12-Lead  . ECHOCARDIOGRAM COMPLETE   Meds ordered this encounter  Medications  . atorvastatin (LIPITOR) 40 MG tablet    Sig: Take 1 tablet (40 mg total) by mouth daily.    Dispense:  90 tablet    Refill:  3    Dose increase    Patient Instructions  Medication Instructions:  INCREASE atorvastatin (Lipitor) to 40 mg daily  *If you need a refill on your cardiac  medications before your next appointment, please call your pharmacy*  Testing/Procedures: Your physician has requested that you have an echocardiogram. Echocardiography is a painless test that uses sound waves to create images of your heart. It provides your doctor with information about the size and shape of your heart and how well your heart's chambers and valves are working. This procedure takes approximately one hour. There are no restrictions for this procedure. This will be done at our [facility] location:  [place] has requested that you have a lexiscan myoview. For further information please visit [email or web address]. Please follow instruction sheet, as given.  Follow-Up: At [facility], you and your health needs are our priority.  As part of our continuing mission to provide you with exceptional heart care, we have created designated Provider Care Teams.  These Care Teams include your primary Cardiologist (physician) and Advanced Practice Providers (APPs -  Physician Assistants and Nurse Practitioners) who all work together to provide you with the care you need, when you need it.  We recommend signing up for the patient portal called "MyChart".  Sign up information is provided on this After Visit Summary.  MyChart is used to connect with patients for Virtual Visits (Telemedicine).  Patients are able to view lab/test results, encounter notes, upcoming appointments, etc.  Non-urgent messages can be sent to your provider as well.   To learn more about what you can do with MyChart, go to [email or web address].    Your next appointment:   2 month(s)  The format for your next appointment:   In Person  Provider:   Oswaldo Milian, MD        Signed, Donato Heinz, MD  07/18/2019 9:19 AM    [place]

## 2019-07-19 ENCOUNTER — Telehealth (HOSPITAL_COMMUNITY): Payer: Self-pay

## 2019-07-19 ENCOUNTER — Other Ambulatory Visit: Payer: Self-pay | Admitting: Internal Medicine

## 2019-07-19 DIAGNOSIS — I10 Essential (primary) hypertension: Secondary | ICD-10-CM

## 2019-07-19 NOTE — Telephone Encounter (Signed)
Encounter complete. 

## 2019-07-21 ENCOUNTER — Other Ambulatory Visit: Payer: Self-pay

## 2019-07-21 ENCOUNTER — Ambulatory Visit (HOSPITAL_COMMUNITY)
Admission: RE | Admit: 2019-07-21 | Discharge: 2019-07-21 | Disposition: A | Discharge status: Home / Self Care | Payer: BC Managed Care – PPO | Source: Ambulatory Visit | Attending: Cardiology | Admitting: Cardiology

## 2019-07-21 DIAGNOSIS — R079 Chest pain, unspecified: Secondary | ICD-10-CM | POA: Insufficient documentation

## 2019-07-21 LAB — MYOCARDIAL PERFUSION IMAGING
LV dias vol: 137 mL (ref 62–150)
LV sys vol: 66 mL
Peak HR: 74 {beats}/min
Rest HR: 49 {beats}/min
SDS: 0
SRS: 8
SSS: 8
TID: 1.14

## 2019-07-21 MED ORDER — TECHNETIUM TC 99M TETROFOSMIN IV KIT
9.4000 | PACK | Freq: Once | INTRAVENOUS | Status: AC | PRN
  Administered 2019-07-21: 9.4 via INTRAVENOUS
  Filled 2019-07-21: qty 10

## 2019-07-21 MED ORDER — REGADENOSON 0.4 MG/5ML IV SOLN
0.4000 mg | Freq: Once | INTRAVENOUS | Status: AC
  Administered 2019-07-21: 0.4 mg via INTRAVENOUS

## 2019-07-21 MED ORDER — TECHNETIUM TC 99M TETROFOSMIN IV KIT
31.2000 | PACK | Freq: Once | INTRAVENOUS | Status: AC | PRN
  Administered 2019-07-21: 31.2 via INTRAVENOUS
  Filled 2019-07-21: qty 32

## 2019-08-03 ENCOUNTER — Other Ambulatory Visit: Payer: Self-pay

## 2019-08-03 ENCOUNTER — Ambulatory Visit (HOSPITAL_COMMUNITY): Payer: BC Managed Care – PPO | Attending: Cardiology

## 2019-08-03 DIAGNOSIS — R079 Chest pain, unspecified: Secondary | ICD-10-CM | POA: Diagnosis not present

## 2019-08-03 LAB — ECHOCARDIOGRAM COMPLETE
Area-P 1/2: 3.72 cm2
S' Lateral: 2.6 cm

## 2019-08-15 ENCOUNTER — Other Ambulatory Visit: Payer: Self-pay | Admitting: Internal Medicine

## 2019-08-15 DIAGNOSIS — I10 Essential (primary) hypertension: Secondary | ICD-10-CM

## 2019-08-15 DIAGNOSIS — F5105 Insomnia due to other mental disorder: Secondary | ICD-10-CM

## 2019-08-23 ENCOUNTER — Encounter (HOSPITAL_COMMUNITY): Payer: Self-pay

## 2019-08-23 ENCOUNTER — Other Ambulatory Visit: Payer: Self-pay

## 2019-08-23 ENCOUNTER — Emergency Department (HOSPITAL_COMMUNITY): Payer: BC Managed Care – PPO

## 2019-08-23 ENCOUNTER — Emergency Department (HOSPITAL_COMMUNITY)
Admission: EM | Admit: 2019-08-23 | Discharge: 2019-08-23 | Disposition: A | Discharge status: Home / Self Care | Payer: BC Managed Care – PPO | Attending: Emergency Medicine | Admitting: Emergency Medicine

## 2019-08-23 DIAGNOSIS — Z5321 Procedure and treatment not carried out due to patient leaving prior to being seen by health care provider: Secondary | ICD-10-CM | POA: Insufficient documentation

## 2019-08-23 DIAGNOSIS — R0789 Other chest pain: Secondary | ICD-10-CM | POA: Insufficient documentation

## 2019-08-23 DIAGNOSIS — R079 Chest pain, unspecified: Secondary | ICD-10-CM | POA: Diagnosis not present

## 2019-08-23 LAB — BASIC METABOLIC PANEL
Anion gap: 9 (ref 5–15)
BUN: 31 mg/dL — ABNORMAL HIGH (ref 8–23)
CO2: 26 mmol/L (ref 22–32)
Calcium: 9.4 mg/dL (ref 8.9–10.3)
Chloride: 105 mmol/L (ref 98–111)
Creatinine, Ser: 0.86 mg/dL (ref 0.61–1.24)
GFR calc Af Amer: >60 mL/min (ref >60)
GFR calc non Af Amer: >60 mL/min (ref >60)
Glucose, Bld: 115 mg/dL — ABNORMAL HIGH (ref 70–99)
Potassium: 3.7 mmol/L (ref 3.5–5.1)
Sodium: 140 mmol/L (ref 135–145)

## 2019-08-23 LAB — CBC
HCT: 39.7 % (ref 39.0–52.0)
Hemoglobin: 13.8 g/dL (ref 13.0–17.0)
MCH: 30.3 pg (ref 26.0–34.0)
MCHC: 34.8 g/dL (ref 30.0–36.0)
MCV: 87.1 fL (ref 80.0–100.0)
Platelets: 316 10*3/uL (ref 150–400)
RBC: 4.56 MIL/uL (ref 4.22–5.81)
RDW: 13.3 % (ref 11.5–15.5)
WBC: 5.9 10*3/uL (ref 4.0–10.5)
nRBC: 0 % (ref 0.0–0.2)

## 2019-08-23 LAB — TROPONIN I (HIGH SENSITIVITY): Troponin I (High Sensitivity): <2 ng/L (ref <18)

## 2019-08-23 NOTE — ED Notes (Signed)
Pt is supposed to leave to go to [place] today, states that he is going to leave and is feeling okay, better than yesterday.

## 2019-08-23 NOTE — ED Triage Notes (Addendum)
Pt arrives today c/o left sided chest pain since yesterday. Pt has been being worked up by cardiology for heart issues. Pt states that the pain was more sharp yesterday, and today is a dull ache.

## 2019-08-30 DIAGNOSIS — M1711 Unilateral primary osteoarthritis, right knee: Secondary | ICD-10-CM | POA: Diagnosis not present

## 2019-09-01 ENCOUNTER — Telehealth: Payer: Self-pay | Admitting: Cardiology

## 2019-09-01 NOTE — Telephone Encounter (Signed)
° °  Primary Cardiologist: No primary care provider on file.  Chart reviewed as part of pre-operative protocol coverage. Given past medical history and time since last visit, based on ACC/AHA guidelines, Juan [name] would be at acceptable risk for the planned procedure without further cardiovascular testing.   His aspirin may be held for 5-7 days prior to this procedure.  Please resume as soon as hemostasis is achieved.  I will route this recommendation to the requesting party via Epic fax function and remove from pre-op pool.  Please call with questions.  Jossie Ng. Armoni Kludt NP-C    09/01/2019, 10:05 AM [address] Office [phone] Fax [phone]

## 2019-09-01 NOTE — Telephone Encounter (Signed)
   [facility] Pre-operative Risk Assessment    HEARTCARE STAFF: - Please ensure there is not already an duplicate clearance open for this procedure. - Under Visit Info/Reason for Call, type in Other and utilize the format Clearance MM/DD/YY or Clearance TBD. Do not use dashes or single digits. - If request is for dental extraction, please clarify the # of teeth to be extracted.  Request for surgical clearance:  1. What type of surgery is being performed? R knee Arthoplasty   2. When is this surgery scheduled? 10/05/19   3. What type of clearance is required (medical clearance vs. Pharmacy clearance to hold med vs. Both)?  BOTH  4. Are there any medications that need to be held prior to surgery and how long? YES   5. Practice name and name of physician performing surgery? [facility]  6. What is the office phone number? [phone]    7.   What is the office fax number? [phone]  8.   Anesthesia type (None, local, MAC, general) ? Spinal Anesthesia   Jim Like 6/80/3212, 9:47 AM  _________________________________________________________________   (provider comments below)  +

## 2019-09-05 ENCOUNTER — Telehealth: Payer: Self-pay | Admitting: Internal Medicine

## 2019-09-05 ENCOUNTER — Other Ambulatory Visit: Payer: Self-pay | Admitting: Internal Medicine

## 2019-09-05 DIAGNOSIS — M5126 Other intervertebral disc displacement, lumbar region: Secondary | ICD-10-CM

## 2019-09-05 MED ORDER — TIZANIDINE HCL 4 MG PO CAPS: 4.0000 mg | ORAL_CAPSULE | Freq: Three times a day (TID) | ORAL | 1 refills | Status: DC | PRN

## 2019-09-05 MED ORDER — METHYLPREDNISOLONE 4 MG PO TBPK: ORAL_TABLET | ORAL | 0 refills | Status: AC

## 2019-09-05 NOTE — Telephone Encounter (Signed)
Wells Guiles calling re: surgical clearance that she faxed last week for this patient.  Please advise/follow-up

## 2019-09-07 NOTE — Telephone Encounter (Signed)
Noted  

## 2019-09-11 ENCOUNTER — Other Ambulatory Visit: Payer: Self-pay | Admitting: Internal Medicine

## 2019-09-11 DIAGNOSIS — M170 Bilateral primary osteoarthritis of knee: Secondary | ICD-10-CM

## 2019-09-11 DIAGNOSIS — F320 Major depressive disorder, single episode, mild: Secondary | ICD-10-CM

## 2019-09-12 ENCOUNTER — Other Ambulatory Visit: Payer: Self-pay

## 2019-09-12 ENCOUNTER — Ambulatory Visit (INDEPENDENT_AMBULATORY_CARE_PROVIDER_SITE_OTHER): Payer: BC Managed Care – PPO | Admitting: Internal Medicine

## 2019-09-12 ENCOUNTER — Encounter: Payer: Self-pay | Admitting: Internal Medicine

## 2019-09-12 VITALS — BP 114/74 | HR 56 | Temp 97.9°F | Resp 16 | Ht 72.0 in | Wt 178.0 lb

## 2019-09-12 DIAGNOSIS — Z23 Encounter for immunization: Secondary | ICD-10-CM

## 2019-09-12 DIAGNOSIS — R739 Hyperglycemia, unspecified: Secondary | ICD-10-CM | POA: Diagnosis not present

## 2019-09-12 DIAGNOSIS — Z01818 Encounter for other preprocedural examination: Secondary | ICD-10-CM | POA: Insufficient documentation

## 2019-09-12 DIAGNOSIS — I10 Essential (primary) hypertension: Secondary | ICD-10-CM

## 2019-09-12 MED ORDER — INDAPAMIDE 1.25 MG PO TABS: 1.2500 mg | ORAL_TABLET | Freq: Every day | ORAL | 0 refills | Status: DC

## 2019-09-12 NOTE — Progress Notes (Signed)
Subjective:  Patient ID: Juan [name], male    DOB: March 21, 1955  Age: 64 y.o. MRN: [id number]  CC: Pre-op Exam and Hypertension  This visit occurred during the SARS-CoV-2 public health emergency.  Safety protocols were in place, including screening questions prior to the visit, additional usage of staff PPE, and extensive cleaning of exam room while observing appropriate contact time as indicated for disinfecting solutions.    HPI Juan [name] presents for f/up and pre-op clearance. He is having knee surgery soon. He is active and denies CP, DOE, palpitations, edema, fatigue.  Outpatient Medications Prior to Visit  Medication Sig Dispense Refill  . aspirin EC 81 MG tablet Take 1 tablet (81 mg total) by mouth daily. 90 tablet 3  . atorvastatin (LIPITOR) 40 MG tablet Take 1 tablet (40 mg total) by mouth daily. 90 tablet 3  . B-D 3CC LUER-LOK SYR 18GX1-1/2 18G X 1-1/2" 3 ML MISC USE AS DIRECTED FOR TESTOSTERONE INJECTIONS 100 each 1  . BD DISP NEEDLE 23G X 1" MISC USE AS DIRECTED FOR TESTOSTERONE INJECTIONS 100 each 1  . carvedilol (COREG) 3.125 MG tablet TAKE 1 TABLET(3.125 MG) BY MOUTH TWICE DAILY WITH A MEAL 180 tablet 1  . CELEBREX 200 MG capsule Take 200 mg by mouth daily as needed.    . DULoxetine (CYMBALTA) 60 MG capsule TAKE 1 CAPSULE(60 MG) BY MOUTH DAILY 90 capsule 1  . losartan (COZAAR) 100 MG tablet TAKE 1 TABLET(100 MG) BY MOUTH DAILY 90 tablet 1  . tadalafil (CIALIS) 5 MG tablet TAKE 1 TABLET(5 MG) BY MOUTH DAILY AS NEEDED FOR ERECTILE DYSFUNCTION 90 tablet 1  . testosterone cypionate (DEPOTESTOSTERONE CYPIONATE) 200 MG/ML injection Inject 1 mL (200 mg total) into the muscle every 14 (fourteen) days. 10 mL 1  . tiZANidine (ZANAFLEX) 4 MG capsule Take 1 capsule (4 mg total) by mouth 3 (three) times daily as needed for muscle spasms. 90 capsule 1  . traMADol (ULTRAM) 50 MG tablet Take 50 mg by mouth daily as needed.    . traZODone (DESYREL) 50 MG tablet TAKE 1 TO 2 TABLETS BY MOUTH  AT BEDTIME AS NEEDED 180 tablet 1  . B-D 3CC LUER-LOK SYR 23GX1" 23G X 1" 3 ML MISC   0  . BD DISP NEEDLES 18G X 1-1/2" MISC   0  . fish oil-omega-3 fatty acids 1000 MG capsule Take 2 g by mouth daily.      . indapamide (LOZOL) 2.5 MG tablet TAKE 1 TABLET(2.5 MG) BY MOUTH DAILY 90 tablet 1  . lidocaine (LIDODERM) 5 % Place 1 patch onto the skin every 12 (twelve) hours. Remove & Discard patch within 12 hours or as directed by MD 30 patch 2   No facility-administered medications prior to visit.    ROS Review of Systems  Constitutional: Negative.  Negative for diaphoresis and fatigue.  HENT: Negative.  Negative for sinus pressure and trouble swallowing.   Eyes: Negative.   Cardiovascular: Negative for chest pain, palpitations and leg swelling.  Gastrointestinal: Negative for abdominal pain, constipation, diarrhea, nausea and vomiting.  Genitourinary: Negative.  Negative for difficulty urinating and dysuria.  Musculoskeletal: Positive for arthralgias. Negative for back pain and myalgias.  Neurological: Negative.  Negative for dizziness and weakness.  Hematological: Negative for adenopathy. Does not bruise/bleed easily.  Psychiatric/Behavioral: Negative.     Objective:  BP 114/74   Pulse (!) 56   Temp 97.9 F (36.6 C) (Oral)   Resp 16   Ht 6' (1.829 m)  Wt 178 lb (80.7 kg)   SpO2 97%   BMI 24.14 kg/m   BP Readings from Last 3 Encounters:  09/12/19 114/74  07/18/19 119/79  06/14/19 122/78    Wt Readings from Last 3 Encounters:  09/12/19 178 lb (80.7 kg)  07/21/19 180 lb (81.6 kg)  07/18/19 180 lb (81.6 kg)    Physical Exam Vitals reviewed.  Constitutional:      Appearance: Normal appearance.  HENT:     Nose: Nose normal.     Mouth/Throat:     Mouth: Mucous membranes are moist.  Eyes:     General: No scleral icterus.    Conjunctiva/sclera: Conjunctivae normal.  Cardiovascular:     Rate and Rhythm: Normal rate and regular rhythm.     Heart sounds: No murmur  heard.   Pulmonary:     Effort: Pulmonary effort is normal.     Breath sounds: No stridor. No wheezing, rhonchi or rales.  Abdominal:     General: Abdomen is flat.     Palpations: There is no mass.     Tenderness: There is no abdominal tenderness. There is no guarding.  Musculoskeletal:        General: Normal range of motion.     Cervical back: Neck supple.     Right lower leg: No edema.     Left lower leg: No edema.  Lymphadenopathy:     Cervical: No cervical adenopathy.  Skin:    General: Skin is warm and dry.     Coloration: Skin is not pale.  Neurological:     General: No focal deficit present.     Mental Status: He is alert.     Lab Results  Component Value Date   WBC 6.6 09/12/2019   HGB 15.0 09/12/2019   HCT 45.1 09/12/2019   PLT 374 09/12/2019   GLUCOSE 94 09/12/2019   CHOL 149 06/14/2019   TRIG 105.0 06/14/2019   HDL 48.80 06/14/2019   LDLDIRECT 98.0 02/28/2018   LDLCALC 79 06/14/2019   ALT 27 06/14/2019   AST 20 06/14/2019   NA 138 09/12/2019   K 3.8 09/12/2019   CL 101 09/12/2019   CREATININE 0.96 09/12/2019   BUN 20 09/12/2019   CO2 30 09/12/2019   TSH 0.78 06/14/2019   PSA 0.20 06/14/2019   INR 1.0 09/12/2019   HGBA1C 5.3 09/12/2019    MYOCARDIAL PERFUSION IMAGING  Result Date: 07/21/2019  The left ventricular ejection fraction is mildly decreased (45-54%).  Nuclear stress EF: 51%.  No T wave inversion was noted during stress.  There was no ST segment deviation noted during stress.  Defect 1: There is a large defect of moderate severity present in the mid inferoseptal, mid inferior, apical septal and apical inferior location.  This is an intermediate risk study.  Large size, moderate intensity fixed inferior/inferoseptal perfusion defect with normal wall motion, suggestive of artifact or less likely scar. No significant reversible ischemia. LVEF 51% with normal wall motion. This is an intermediate risk study given the size of the defect.     Assessment & Plan:   Juan [name] was seen today for pre-op exam and hypertension.  Diagnoses and all orders for this visit:  Essential hypertension- His BP is well controlled. -     CBC with Differential/Platelet; Future -     BASIC METABOLIC PANEL WITH GFR; Future -     indapamide (LOZOL) 1.25 MG tablet; Take 1 tablet (1.25 mg total) by mouth daily. -  BASIC METABOLIC PANEL WITH GFR -     CBC with Differential/Platelet  Chronic hyperglycemia- His blood sugar is normal now. -     BASIC METABOLIC PANEL WITH GFR; Future -     Hemoglobin A1c; Future -     Hemoglobin A1c -     BASIC METABOLIC PANEL WITH GFR  Preop examination- His MICA risk is 0.2%. Labs are normal. I recommend that her proceed with the orthopedic surgery. -     Protime-INR; Future -     Protime-INR  Other orders -     Flu Vaccine QUAD 6+ mos PF IM (Fluarix Quad PF)   I have discontinued Milbert Coulter Carbon's fish oil-omega-3 fatty acids, lidocaine, and indapamide. I am also having him start on indapamide. Additionally, I am having him maintain his B-D 3CC LUER-LOK SYR 18GX1-1/2, BD Disp Needle, CeleBREX, traMADol, tadalafil, testosterone cypionate, losartan, aspirin EC, atorvastatin, traZODone, carvedilol, tiZANidine, and DULoxetine.  Meds ordered this encounter  Medications  . indapamide (LOZOL) 1.25 MG tablet    Sig: Take 1 tablet (1.25 mg total) by mouth daily.    Dispense:  90 tablet    Refill:  0     Follow-up: Return in about 6 months (around 03/11/2020).  Scarlette Calico, MD

## 2019-09-12 NOTE — Patient Instructions (Signed)

## 2019-09-13 ENCOUNTER — Encounter: Payer: Self-pay | Admitting: Internal Medicine

## 2019-09-13 LAB — BASIC METABOLIC PANEL WITH GFR
BUN: 20 mg/dL (ref 7–25)
CO2: 30 mmol/L (ref 20–32)
Calcium: 9.5 mg/dL (ref 8.6–10.3)
Chloride: 101 mmol/L (ref 98–110)
Creat: 0.96 mg/dL (ref 0.70–1.25)
GFR, Est African American: 96 mL/min/{1.73_m2} (ref >=60)
GFR, Est Non African American: 83 mL/min/{1.73_m2} (ref >=60)
Glucose, Bld: 94 mg/dL (ref 65–99)
Potassium: 3.8 mmol/L (ref 3.5–5.3)
Sodium: 138 mmol/L (ref 135–146)

## 2019-09-13 LAB — CBC WITH DIFFERENTIAL/PLATELET
Absolute Monocytes: 759 cells/uL (ref 200–950)
Basophils Absolute: 59 cells/uL (ref 0–200)
Basophils Relative: 0.9 %
Eosinophils Absolute: 158 cells/uL (ref 15–500)
Eosinophils Relative: 2.4 %
HCT: 45.1 % (ref 38.5–50.0)
Hemoglobin: 15 g/dL (ref 13.2–17.1)
Lymphs Abs: 2171 cells/uL (ref 850–3900)
MCH: 29.8 pg (ref 27.0–33.0)
MCHC: 33.3 g/dL (ref 32.0–36.0)
MCV: 89.7 fL (ref 80.0–100.0)
MPV: 9.7 fL (ref 7.5–12.5)
Monocytes Relative: 11.5 %
Neutro Abs: 3452 cells/uL (ref 1500–7800)
Neutrophils Relative %: 52.3 %
Platelets: 374 10*3/uL (ref 140–400)
RBC: 5.03 10*6/uL (ref 4.20–5.80)
RDW: 13.4 % (ref 11.0–15.0)
Total Lymphocyte: 32.9 %
WBC: 6.6 10*3/uL (ref 3.8–10.8)

## 2019-09-13 LAB — HEMOGLOBIN A1C
Hgb A1c MFr Bld: 5.3 % of total Hgb (ref <5.7)
Mean Plasma Glucose: 105 (calc)
eAG (mmol/L): 5.8 (calc)

## 2019-09-13 LAB — PROTIME-INR
INR: 1
Prothrombin Time: 10.7 s (ref 9.0–11.5)

## 2019-09-24 NOTE — Progress Notes (Signed)
Cardiology Office Note:    Date:  09/28/2019   ID:  Juan [name], DOB [birth year], MRN [id number]  PCP:  Janith Lima, MD  Cardiologist:  No primary care provider on file.  Electrophysiologist:  None   Referring MD: Janith Lima, MD   Chief Complaint  Patient presents with  . Pre-op Exam   History of Present Illness:    Juan [name] is a 64 y.o. male with a hx of hypertension, hyperlipidemia who presents for follow-up.  He was referred by Dr. Ronnald Ramp for evaluation of CAD, initially seen on 07/18/2019.  He underwent calcium score on 06/23/2019, which was 1716 (97th percentile).  He reports he has been having chest pain.  Occurs about once per month.  Describes as tightness in center of his chest.  Has not noted a relationship with exertion, can occur at rest.  States it is 2-3 out of 10 in intensity and can last for several hours.  For exercise he does yoga and lifts weights about once per week.  Used to go for walks but not recently he is having knee pain and is being evaluated for knee replacement.  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  Reports he has had some fatigue dyspnea with exertion.  He denies any lightheadedness, syncope, lower extremity edema.  Reports his BP has been under good control recently. 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  Quit cigarettes 18 years ago.  Father had CABG in early 4s.    Lexiscan Myoview on 07/21/2019 showed large size moderate intensity fixed inferior/inferoseptal perfusion defect with normal wall motion, suggestive of artifact or less likely scar, no ischemia, LVEF 51%.  Echocardiogram on 08/03/2019 showed normal biventricular function, mild AI.  Since last clinic visit, he reports that he is doing well.  Denies any further chest pain.  Denies any dyspnea.  He has been doing yoga but exercise has been limited by his knee pain.  Planning knee replacement on 9/30.  Reports that he can walk up 2 flights of stairs  without stopping.   Past Medical History:  Diagnosis Date  . Hypertension   . Personal history of colonic polyps - adenoma 04/24/2008   04/2008 - diminutive adenoma 06/13/2013      Past Surgical History:  Procedure Laterality Date  . APPENDECTOMY  2007   ruptured  . COLONOSCOPY      Current Medications: Current Meds  Medication Sig  . aspirin EC 81 MG tablet Take 1 tablet (81 mg total) by mouth daily.  Marland Kitchen atorvastatin (LIPITOR) 40 MG tablet Take 1 tablet (40 mg total) by mouth daily.  . B-D 3CC LUER-LOK SYR 18GX1-1/2 18G X 1-1/2" 3 ML MISC USE AS DIRECTED FOR TESTOSTERONE INJECTIONS  . BD DISP NEEDLE 23G X 1" MISC USE AS DIRECTED FOR TESTOSTERONE INJECTIONS  . carvedilol (COREG) 3.125 MG tablet TAKE 1 TABLET(3.125 MG) BY MOUTH TWICE DAILY WITH A MEAL  . CELEBREX 200 MG capsule Take 200 mg by mouth daily as needed.  . DULoxetine (CYMBALTA) 60 MG capsule Take 60 mg by mouth daily.  . indapamide (LOZOL) 1.25 MG tablet Take 1 tablet (1.25 mg total) by mouth daily.  Marland Kitchen losartan (COZAAR) 100 MG tablet TAKE 1 TABLET(100 MG) BY MOUTH DAILY  . tadalafil (CIALIS) 5 MG tablet TAKE 1 TABLET(5 MG) BY MOUTH DAILY AS NEEDED FOR ERECTILE DYSFUNCTION  . testosterone cypionate (DEPOTESTOSTERONE CYPIONATE) 200 MG/ML injection Inject 1 mL (200 mg  total) into the muscle every 14 (fourteen) days.  Marland Kitchen tiZANidine (ZANAFLEX) 4 MG capsule Take 1 capsule (4 mg total) by mouth 3 (three) times daily as needed for muscle spasms.  . traMADol (ULTRAM) 50 MG tablet Take 50 mg by mouth daily as needed.  . traZODone (DESYREL) 50 MG tablet TAKE 1 TO 2 TABLETS BY MOUTH AT BEDTIME AS NEEDED     Allergies:   Ace inhibitors   Social History   Socioeconomic History  . Marital status: Married    Spouse name: Not on file  . Number of children: Not on file  . Years of education: Not on file  . Highest education level: Not on file  Occupational History  . Not on file  Tobacco Use  . Smoking status: Former Smoker     Quit date: 01/06/1999    Years since quitting: 20.7  . Smokeless tobacco: Never Used  Substance and Sexual Activity  . Alcohol use: No  . 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 Sexual activity: Yes  Other Topics Concern  . Not on file  Social History Narrative   Regular exercise-Yes   He did rehab 12 years ago and has screened several times for HIV and Hep A/B/C and always negative.   Social Determinants of Health   Financial Resource Strain:   . Difficulty of Paying Living Expenses: Not on file  Food Insecurity:   . Worried About Charity fundraiser in the Last Year: Not on file  . Ran Out of Food in the Last Year: Not on file  Transportation Needs:   . Lack of Transportation (Medical): Not on file  . Lack of Transportation (Non-Medical): Not on file  Physical Activity:   . Days of Exercise per Week: Not on file  . Minutes of Exercise per Session: Not on file  Stress:   . Feeling of Stress : Not on file  Social Connections:   . Frequency of Communication with Friends and Family: Not on file  . Frequency of Social Gatherings with Friends and Family: Not on file  . Attends Religious Services: Not on file  . Active Member of Clubs or Organizations: Not on file  . Attends Archivist Meetings: Not on file  . Marital Status: Not on file     Family History: The patient's family history includes Heart disease in his father; Prostate cancer in his father. There is no history of Colon cancer.  ROS:   Please see the history of present illness.     All other systems reviewed and are negative.  EKGs/Labs/Other Studies Reviewed:    The following studies were reviewed today:   EKG:  EKG is not ordered today.  The ekg ordered at prior clinic visit demonstrates normal sinus rhythm, incomplete right bundle branch block, rate 60, no ST abnormalities  Recent Labs: 06/14/2019: ALT 27; TSH 0.78 09/12/2019: BUN 20; Creat 0.96;  Hemoglobin 15.0; Platelets 374; Potassium 3.8; Sodium 138  Recent Lipid Panel    Component Value Date/Time   CHOL 149 06/14/2019 0931   TRIG 105.0 06/14/2019 0931   HDL 48.80 06/14/2019 0931   CHOLHDL 3 06/14/2019 0931   VLDL 21.0 06/14/2019 0931   LDLCALC 79 06/14/2019 0931   LDLDIRECT 98.0 02/28/2018 1546    Physical Exam:    VS:  BP 120/80   Pulse 62   Ht 6' (1.829 m)   Wt 179 lb (  81.2 kg)   SpO2 96%   BMI 24.28 kg/m     Wt Readings from Last 3 Encounters:  09/28/19 179 lb (81.2 kg)  09/12/19 178 lb (80.7 kg)  07/21/19 180 lb (81.6 kg)     GEN: Well nourished, well developed in no acute distress HEENT: Normal NECK: No JVD; No carotid bruits LYMPHATICS: No lymphadenopathy CARDIAC: RRR, no murmurs, rubs, gallops RESPIRATORY:  Clear to auscultation without rales, wheezing or rhonchi  ABDOMEN: Soft, non-tender, non-distended MUSCULOSKELETAL:  No edema; No deformity  SKIN: Warm and dry NEUROLOGIC:  Alert and oriented x 3 PSYCHIATRIC:  Normal affect   ASSESSMENT:    1. Coronary artery disease involving native coronary artery of native heart, angina presence unspecified   2. Hyperlipidemia, unspecified hyperlipidemia type   3. Pre-op evaluation   4. Essential hypertension    PLAN:    CAD: calcium score on 06/23/2019 was 1716 (97th percentile).  He reported atypical chest pain.  Lexiscan Myoview on 07/21/2019 showed large size moderate intensity fixed inferior/inferoseptal perfusion defect with normal wall motion, suggestive of artifact or less likely scar, no ischemia, LVEF 51%.  Echocardiogram on 08/03/2019 showed normal biventricular function, mild AI.  Reports chest pain has resolved. -Continue atorvastatin 40 mg daily, will check lipid panel to ensure LDL at goal <70  Preop evaluation: Prior to knee replacement.  Previously reported chest pain and noted to have significantly elevated calcium score.  Lexiscan Myoview on 07/21/2019 shows no ischemia.  Echocardiogram  shows normal biventricular function, no significant valvular disease.  He reports chest pain has resolved.  Functional capacity greater than 4 METS.  RCRI score is 0. -No further cardiac work-up recommended prior to surgery  Hyperlipidemia:  LDL 79 on 06/14/2019.  Atorvastatin increased from 20 to 40 mg daily to target goal LDL less than 70.  Will recheck lipid panel.  Hypertension: On carvedilol 3.25 mg twice daily, losartan 100 mg daily, and indapamide.  Appears controlled  RTC in 6 months   Medication Adjustments/Labs and Tests Ordered: Current medicines are reviewed at length with the patient today.  Concerns regarding medicines are outlined above.  Orders Placed This Encounter  Procedures  . Lipid panel   No orders of the defined types were placed in this encounter.   Patient Instructions  Medication Instructions:  Your physician recommends that you continue on your current medications as directed. Please refer to the Current Medication list given to you today.  *If you need a refill on your cardiac medications before your next appointment, please call your pharmacy*   Lab Work: Lipid today  If you have labs (blood work) drawn today and your tests are completely normal, you will receive your results only by: Marland Kitchen MyChart Message (if you have MyChart) OR . A paper copy in the mail If you have any lab test that is abnormal or we need to change your treatment, we will call you to review the results.  Follow-Up: At [facility], you and your health needs are our priority.  As part of our continuing mission to provide you with exceptional heart care, we have created designated Provider Care Teams.  These Care Teams include your primary Cardiologist (physician) and Advanced Practice Providers (APPs -  Physician Assistants and Nurse Practitioners) who all work together to provide you with the care you need, when you need it.  We recommend signing up for the patient portal called  "MyChart".  Sign up information is provided on this After Visit Summary.  MyChart is used to connect with patients for Virtual Visits (Telemedicine).  Patients are able to view lab/test results, encounter notes, upcoming appointments, etc.  Non-urgent messages can be sent to your provider as well.   To learn more about what you can do with MyChart, go to [email or web address].    Your next appointment:   6 month(s)  The format for your next appointment:   In Person  Provider:   Oswaldo Milian, MD         Signed, Donato Heinz, MD  09/28/2019 10:13 AM    [place]

## 2019-09-25 DIAGNOSIS — M1711 Unilateral primary osteoarthritis, right knee: Secondary | ICD-10-CM | POA: Diagnosis not present

## 2019-09-28 ENCOUNTER — Other Ambulatory Visit: Payer: Self-pay

## 2019-09-28 ENCOUNTER — Encounter: Payer: Self-pay | Admitting: Cardiology

## 2019-09-28 ENCOUNTER — Ambulatory Visit (INDEPENDENT_AMBULATORY_CARE_PROVIDER_SITE_OTHER): Payer: BC Managed Care – PPO | Admitting: Cardiology

## 2019-09-28 VITALS — BP 120/80 | HR 62 | Ht 72.0 in | Wt 179.0 lb

## 2019-09-28 DIAGNOSIS — I10 Essential (primary) hypertension: Secondary | ICD-10-CM

## 2019-09-28 DIAGNOSIS — I2510 Atherosclerotic heart disease of native coronary artery without angina pectoris: Secondary | ICD-10-CM | POA: Diagnosis not present

## 2019-09-28 DIAGNOSIS — Z01818 Encounter for other preprocedural examination: Secondary | ICD-10-CM | POA: Diagnosis not present

## 2019-09-28 DIAGNOSIS — E785 Hyperlipidemia, unspecified: Secondary | ICD-10-CM

## 2019-09-28 LAB — LIPID PANEL
Chol/HDL Ratio: 2.8 ratio (ref 0.0–5.0)
Cholesterol, Total: 130 mg/dL (ref 100–199)
HDL: 46 mg/dL (ref >39)
LDL Chol Calc (NIH): 61 mg/dL (ref 0–99)
Triglycerides: 129 mg/dL (ref 0–149)
VLDL Cholesterol Cal: 23 mg/dL (ref 5–40)

## 2019-09-28 NOTE — Patient Instructions (Signed)
Medication Instructions:  Your physician recommends that you continue on your current medications as directed. Please refer to the Current Medication list given to you today.  *If you need a refill on your cardiac medications before your next appointment, please call your pharmacy*   Lab Work: Lipid today  If you have labs (blood work) drawn today and your tests are completely normal, you will receive your results only by: . MyChart Message (if you have MyChart) OR . A paper copy in the mail If you have any lab test that is abnormal or we need to change your treatment, we will call you to review the results.   Follow-Up: At [facility], you and your health needs are our priority.  As part of our continuing mission to provide you with exceptional heart care, we have created designated Provider Care Teams.  These Care Teams include your primary Cardiologist (physician) and Advanced Practice Providers (APPs -  Physician Assistants and Nurse Practitioners) who all work together to provide you with the care you need, when you need it.  We recommend signing up for the patient portal called "MyChart".  Sign up information is provided on this After Visit Summary.  MyChart is used to connect with patients for Virtual Visits (Telemedicine).  Patients are able to view lab/test results, encounter notes, upcoming appointments, etc.  Non-urgent messages can be sent to your provider as well.   To learn more about what you can do with MyChart, go to [email or web address].    Your next appointment:   6 month(s)  The format for your next appointment:   In Person  Provider:   Christopher Schumann, MD    

## 2019-10-05 DIAGNOSIS — M1711 Unilateral primary osteoarthritis, right knee: Secondary | ICD-10-CM | POA: Diagnosis not present

## 2019-11-01 DIAGNOSIS — Z96651 Presence of right artificial knee joint: Secondary | ICD-10-CM | POA: Diagnosis not present

## 2019-11-01 DIAGNOSIS — Z471 Aftercare following joint replacement surgery: Secondary | ICD-10-CM | POA: Diagnosis not present

## 2019-11-10 ENCOUNTER — Encounter: Payer: Self-pay | Admitting: Internal Medicine

## 2019-11-15 ENCOUNTER — Other Ambulatory Visit: Payer: Self-pay

## 2019-11-15 ENCOUNTER — Ambulatory Visit (INDEPENDENT_AMBULATORY_CARE_PROVIDER_SITE_OTHER): Payer: BC Managed Care – PPO

## 2019-11-15 DIAGNOSIS — Z23 Encounter for immunization: Secondary | ICD-10-CM

## 2019-11-15 NOTE — Progress Notes (Signed)
2ND Shingrix vaccine given w/o any complications.

## 2019-12-04 ENCOUNTER — Encounter: Payer: Self-pay | Admitting: Internal Medicine

## 2019-12-10 ENCOUNTER — Other Ambulatory Visit: Payer: Self-pay | Admitting: Internal Medicine

## 2019-12-10 DIAGNOSIS — I10 Essential (primary) hypertension: Secondary | ICD-10-CM

## 2019-12-12 ENCOUNTER — Telehealth: Payer: Self-pay

## 2019-12-12 NOTE — Telephone Encounter (Signed)
Key: [id number]

## 2019-12-12 NOTE — Telephone Encounter (Signed)
Approved Effective from 12/12/2019 through 12/11/2020.

## 2020-01-08 DIAGNOSIS — Z96651 Presence of right artificial knee joint: Secondary | ICD-10-CM | POA: Diagnosis not present

## 2020-02-08 ENCOUNTER — Other Ambulatory Visit: Payer: Self-pay | Admitting: Internal Medicine

## 2020-02-08 DIAGNOSIS — F5105 Insomnia due to other mental disorder: Secondary | ICD-10-CM

## 2020-02-08 DIAGNOSIS — F409 Phobic anxiety disorder, unspecified: Secondary | ICD-10-CM

## 2020-02-08 DIAGNOSIS — I10 Essential (primary) hypertension: Secondary | ICD-10-CM

## 2020-02-08 DIAGNOSIS — N529 Male erectile dysfunction, unspecified: Secondary | ICD-10-CM

## 2020-03-09 ENCOUNTER — Other Ambulatory Visit: Payer: Self-pay | Admitting: Internal Medicine

## 2020-03-09 DIAGNOSIS — I10 Essential (primary) hypertension: Secondary | ICD-10-CM

## 2020-03-14 ENCOUNTER — Other Ambulatory Visit: Payer: Self-pay | Admitting: Internal Medicine

## 2020-03-14 DIAGNOSIS — E291 Testicular hypofunction: Secondary | ICD-10-CM

## 2020-03-15 ENCOUNTER — Encounter: Payer: Self-pay | Admitting: Internal Medicine

## 2020-03-25 NOTE — Progress Notes (Signed)
Cardiology Office Note:    Date:  03/27/2020   ID:  Juan [name], DOB [birth year], MRN [id number]  PCP:  Janith Lima, MD  Cardiologist:  No primary care provider on file.  Electrophysiologist:  None   Referring MD: Janith Lima, MD   Chief Complaint  Patient presents with  . Coronary Artery Disease   History of Present Illness:    Juan [name] is a 65 y.o. male with a hx of hypertension, hyperlipidemia who presents for follow-up.  He was referred by Dr. Ronnald Ramp for evaluation of CAD, initially seen on 07/18/2019.  He underwent calcium score on 06/23/2019, which was 1716 (97th percentile).  He reports he has been having chest pain.  Occurs about once per month.  Describes as tightness in center of his chest.  Has not noted a relationship with exertion, can occur at rest.  States it is 2-3 out of 10 in intensity and can last for several hours.  For exercise he does yoga and lifts weights about once per week.  Used to go for walks but not recently he is having knee pain and is being evaluated for knee replacement.  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  Reports he has had some fatigue dyspnea with exertion.  He denies any lightheadedness, syncope, lower extremity edema.  Reports his BP has been under good control recently. 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  Quit cigarettes 18 years ago.  Father had CABG in early 59s.    Lexiscan Myoview on 07/21/2019 showed large size moderate intensity fixed inferior/inferoseptal perfusion defect with normal wall motion, suggestive of artifact or less likely scar, no ischemia, LVEF 51%.  Echocardiogram on 08/03/2019 showed normal biventricular function, mild AI.  Since last clinic visit, he reports that he is doing well.  Denies any chest pain, dyspnea, lightheadedness, syncope, lower extremity edema, or palpitations.  Has noted some fatigue the last few weeks.  He has been off his testosterone replacement, which  he attributes his fatigue to.  Has been exercising by going to the gym on Sundays, does cardio and weights for 75 minutes.  Planning to restart yoga soon.  He had his knee surgery and is doing well, completed physical therapy.    Past Medical History:  Diagnosis Date  . Hypertension   . Personal history of colonic polyps - adenoma 04/24/2008   04/2008 - diminutive adenoma 06/13/2013      Past Surgical History:  Procedure Laterality Date  . APPENDECTOMY  2007   ruptured  . COLONOSCOPY      Current Medications: Current Meds  Medication Sig  . aspirin EC 81 MG tablet Take 1 tablet (81 mg total) by mouth daily.  Marland Kitchen atorvastatin (LIPITOR) 40 MG tablet Take 1 tablet (40 mg total) by mouth daily.  . B-D 3CC LUER-LOK SYR 18GX1-1/2 18G X 1-1/2" 3 ML MISC USE AS DIRECTED FOR TESTOSTERONE INJECTIONS  . BD DISP NEEDLE 23G X 1" MISC USE AS DIRECTED FOR TESTOSTERONE INJECTIONS  . carvedilol (COREG) 3.125 MG tablet TAKE 1 TABLET(3.125 MG) BY MOUTH TWICE DAILY WITH A MEAL  . CELEBREX 200 MG capsule Take 200 mg by mouth daily as needed.  . DULoxetine (CYMBALTA) 60 MG capsule TAKE 1 CAPSULE(60 MG) BY MOUTH DAILY  . indapamide (LOZOL) 1.25 MG tablet TAKE 1 TABLET(1.25 MG) BY MOUTH DAILY  . losartan (COZAAR) 100 MG tablet TAKE 1 TABLET(100 MG) BY MOUTH DAILY  . tadalafil (  CIALIS) 5 MG tablet TAKE 1 TABLET(5 MG) BY MOUTH DAILY AS NEEDED FOR ERECTILE DYSFUNCTION  . testosterone cypionate (DEPOTESTOSTERONE CYPIONATE) 200 MG/ML injection Inject 1 mL (200 mg total) into the muscle every 14 (fourteen) days.  Marland Kitchen tiZANidine (ZANAFLEX) 4 MG capsule Take 1 capsule (4 mg total) by mouth 3 (three) times daily as needed for muscle spasms.  . traMADol (ULTRAM) 50 MG tablet Take 50 mg by mouth daily as needed.  . traZODone (DESYREL) 50 MG tablet TAKE 1 TO 2 TABLETS BY MOUTH AT BEDTIME AS NEEDED     Allergies:   Ace inhibitors   Social History   Socioeconomic History  . Marital status: Married    Spouse name: Not  on file  . Number of children: Not on file  . Years of education: Not on file  . Highest education level: Not on file  Occupational History  . Not on file  Tobacco Use  . Smoking status: Former Smoker    Quit date: 01/06/1999    Years since quitting: 21.2  . Smokeless tobacco: Never Used  Substance and Sexual Activity  . Alcohol use: No  . 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 Sexual activity: Yes  Other Topics Concern  . Not on file  Social History Narrative   Regular exercise-Yes   He did rehab 12 years ago and has screened several times for HIV and Hep A/B/C and always negative.   Social Determinants of Health   Financial Resource Strain: Not on file  Food Insecurity: Not on file  Transportation Needs: Not on file  Physical Activity: Not on file  Stress: Not on file  Social Connections: Not on file     Family History: The patient's family history includes Heart disease in his father; Prostate cancer in his father. There is no history of Colon cancer.  ROS:   Please see the history of present illness.     All other systems reviewed and are negative.  EKGs/Labs/Other Studies Reviewed:    The following studies were reviewed today:   EKG:  EKG is ordered today.  The ekg ordered demonstrates normal sinus rhythm, incomplete right bundle branch block, rate 63, no ST abnormalities  Recent Labs: 06/14/2019: ALT 27; TSH 0.78 09/12/2019: BUN 20; Creat 0.96; Hemoglobin 15.0; Platelets 374; Potassium 3.8; Sodium 138  Recent Lipid Panel    Component Value Date/Time   CHOL 130 09/28/2019 1006   TRIG 129 09/28/2019 1006   HDL 46 09/28/2019 1006   CHOLHDL 2.8 09/28/2019 1006   CHOLHDL 3 06/14/2019 0931   VLDL 21.0 06/14/2019 0931   LDLCALC 61 09/28/2019 1006   LDLDIRECT 98.0 02/28/2018 1546    Physical Exam:    VS:  BP 132/74   Pulse 63   Ht 6' (1.829 m)   Wt 179 lb (81.2 kg)   SpO2 95%   BMI 24.28 kg/m     Wt  Readings from Last 3 Encounters:  03/27/20 179 lb (81.2 kg)  09/28/19 179 lb (81.2 kg)  09/12/19 178 lb (80.7 kg)     GEN: Well nourished, well developed in no acute distress HEENT: Normal NECK: No JVD; No carotid bruits LYMPHATICS: No lymphadenopathy CARDIAC: RRR, no murmurs, rubs, gallops RESPIRATORY:  Clear to auscultation without rales, wheezing or rhonchi  ABDOMEN: Soft, non-tender, non-distended MUSCULOSKELETAL:  No edema; No deformity  SKIN: Warm and dry NEUROLOGIC:  Alert and oriented x 3 PSYCHIATRIC:  Normal affect   ASSESSMENT:    1. CAD in native artery   2. Essential hypertension   3. Hyperlipidemia, unspecified hyperlipidemia type    PLAN:    CAD: calcium score on 06/23/2019 was 1716 (97th percentile).  He reported atypical chest pain.  Lexiscan Myoview on 07/21/2019 showed large size moderate intensity fixed inferior/inferoseptal perfusion defect with normal wall motion, suggestive of artifact or less likely scar, no ischemia, LVEF 51%.  Echocardiogram on 08/03/2019 showed normal biventricular function, mild AI.  No symptoms to suggest angina. -Continue atorvastatin 40 mg daily -Continue aspirin 81 mg  Hyperlipidemia: Continue atorvastatin 40 mg daily.  LDL 61 09/28/2019.  Hypertension: On carvedilol 3.25 mg twice daily, losartan 100 mg daily, and indapamide.  Appears controlled  RTC in 1 year   Medication Adjustments/Labs and Tests Ordered: Current medicines are reviewed at length with the patient today.  Concerns regarding medicines are outlined above.  Orders Placed This Encounter  Procedures  . EKG 12-Lead  . EKG 12-Lead   No orders of the defined types were placed in this encounter.   Patient Instructions  Medication Instructions:  Your physician recommends that you continue on your current medications as directed. Please refer to the Current Medication list given to you today.  *If you need a refill on your cardiac medications before your next  appointment, please call your pharmacy*  Follow-Up: At [facility], you and your health needs are our priority.  As part of our continuing mission to provide you with exceptional heart care, we have created designated Provider Care Teams.  These Care Teams include your primary Cardiologist (physician) and Advanced Practice Providers (APPs -  Physician Assistants and Nurse Practitioners) who all work together to provide you with the care you need, when you need it.  We recommend signing up for the patient portal called "MyChart".  Sign up information is provided on this After Visit Summary.  MyChart is used to connect with patients for Virtual Visits (Telemedicine).  Patients are able to view lab/test results, encounter notes, upcoming appointments, etc.  Non-urgent messages can be sent to your provider as well.   To learn more about what you can do with MyChart, go to [email or web address].    Your next appointment:   12 month(s)  The format for your next appointment:   In Person  Provider:   Oswaldo Milian, MD       Signed, Donato Heinz, MD  03/27/2020 8:25 AM    [place]

## 2020-03-27 ENCOUNTER — Other Ambulatory Visit: Payer: Self-pay

## 2020-03-27 ENCOUNTER — Ambulatory Visit (INDEPENDENT_AMBULATORY_CARE_PROVIDER_SITE_OTHER): Payer: BC Managed Care – PPO | Admitting: Cardiology

## 2020-03-27 ENCOUNTER — Encounter: Payer: Self-pay | Admitting: Cardiology

## 2020-03-27 VITALS — BP 132/74 | HR 63 | Ht 72.0 in | Wt 179.0 lb

## 2020-03-27 DIAGNOSIS — E785 Hyperlipidemia, unspecified: Secondary | ICD-10-CM

## 2020-03-27 DIAGNOSIS — I10 Essential (primary) hypertension: Secondary | ICD-10-CM | POA: Diagnosis not present

## 2020-03-27 DIAGNOSIS — I2510 Atherosclerotic heart disease of native coronary artery without angina pectoris: Secondary | ICD-10-CM | POA: Diagnosis not present

## 2020-03-27 NOTE — Patient Instructions (Signed)

## 2020-04-02 ENCOUNTER — Other Ambulatory Visit: Payer: Self-pay

## 2020-04-02 ENCOUNTER — Encounter: Payer: Self-pay | Admitting: Internal Medicine

## 2020-04-02 ENCOUNTER — Ambulatory Visit (INDEPENDENT_AMBULATORY_CARE_PROVIDER_SITE_OTHER): Payer: BC Managed Care – PPO | Admitting: Internal Medicine

## 2020-04-02 VITALS — BP 128/80 | HR 60 | Temp 98.1°F | Resp 16 | Ht 72.0 in | Wt 183.0 lb

## 2020-04-02 DIAGNOSIS — I10 Essential (primary) hypertension: Secondary | ICD-10-CM

## 2020-04-02 DIAGNOSIS — E291 Testicular hypofunction: Secondary | ICD-10-CM | POA: Diagnosis not present

## 2020-04-02 DIAGNOSIS — Z86010 Personal history of colonic polyps: Secondary | ICD-10-CM

## 2020-04-02 LAB — BASIC METABOLIC PANEL
BUN: 23 mg/dL (ref 6–23)
CO2: 30 mEq/L (ref 19–32)
Calcium: 9.5 mg/dL (ref 8.4–10.5)
Chloride: 99 mEq/L (ref 96–112)
Creatinine, Ser: 0.95 mg/dL (ref 0.40–1.50)
GFR: 84.34 mL/min (ref >60.00)
Glucose, Bld: 91 mg/dL (ref 70–99)
Potassium: 3.9 mEq/L (ref 3.5–5.1)
Sodium: 137 mEq/L (ref 135–145)

## 2020-04-02 LAB — CBC WITH DIFFERENTIAL/PLATELET
Basophils Absolute: 0 10*3/uL (ref 0.0–0.1)
Basophils Relative: 1.1 % (ref 0.0–3.0)
Eosinophils Absolute: 0.2 10*3/uL (ref 0.0–0.7)
Eosinophils Relative: 5.4 % — ABNORMAL HIGH (ref 0.0–5.0)
HCT: 43.2 % (ref 39.0–52.0)
Hemoglobin: 14.8 g/dL (ref 13.0–17.0)
Lymphocytes Relative: 28.7 % (ref 12.0–46.0)
Lymphs Abs: 1.2 10*3/uL (ref 0.7–4.0)
MCHC: 34.3 g/dL (ref 30.0–36.0)
MCV: 86 fl (ref 78.0–100.0)
Monocytes Absolute: 0.4 10*3/uL (ref 0.1–1.0)
Monocytes Relative: 10.1 % (ref 3.0–12.0)
Neutro Abs: 2.3 10*3/uL (ref 1.4–7.7)
Neutrophils Relative %: 54.7 % (ref 43.0–77.0)
Platelets: 305 10*3/uL (ref 150.0–400.0)
RBC: 5.02 Mil/uL (ref 4.22–5.81)
RDW: 13.8 % (ref 11.5–15.5)
WBC: 4.3 10*3/uL (ref 4.0–10.5)

## 2020-04-02 MED ORDER — TESTOSTERONE 30 MG/ACT TD SOLN: 2.0000 | Freq: Every day | TRANSDERMAL | 1 refills | Status: DC

## 2020-04-02 NOTE — Patient Instructions (Signed)

## 2020-04-02 NOTE — Progress Notes (Signed)
Subjective:  Patient ID: Juan [name], male    DOB: 1955-09-24  Age: 65 y.o. MRN: [id number]  CC: Hypertension  This visit occurred during the SARS-CoV-2 public health emergency.  Safety protocols were in place, including screening questions prior to the visit, additional usage of staff PPE, and extensive cleaning of exam room while observing appropriate contact time as indicated for disinfecting solutions.    HPI Izzak Fries presents for f/up -   He tells like his last testosterone injection was over 3 weeks ago.  He is now starting to feel fatigued, scattered, and unproductive.  He would like to continue using a testosterone supplement but would like to switch to a topical daily application.  Outpatient Medications Prior to Visit  Medication Sig Dispense Refill  . aspirin EC 81 MG tablet Take 1 tablet (81 mg total) by mouth daily. 90 tablet 3  . atorvastatin (LIPITOR) 40 MG tablet Take 1 tablet (40 mg total) by mouth daily. 90 tablet 3  . B-D 3CC LUER-LOK SYR 18GX1-1/2 18G X 1-1/2" 3 ML MISC USE AS DIRECTED FOR TESTOSTERONE INJECTIONS 100 each 1  . BD DISP NEEDLE 23G X 1" MISC USE AS DIRECTED FOR TESTOSTERONE INJECTIONS 100 each 1  . carvedilol (COREG) 3.125 MG tablet TAKE 1 TABLET(3.125 MG) BY MOUTH TWICE DAILY WITH A MEAL 180 tablet 0  . CELEBREX 200 MG capsule Take 200 mg by mouth daily as needed.    . DULoxetine (CYMBALTA) 60 MG capsule TAKE 1 CAPSULE(60 MG) BY MOUTH DAILY 90 capsule 1  . indapamide (LOZOL) 1.25 MG tablet TAKE 1 TABLET(1.25 MG) BY MOUTH DAILY 90 tablet 0  . losartan (COZAAR) 100 MG tablet TAKE 1 TABLET(100 MG) BY MOUTH DAILY 90 tablet 1  . tadalafil (CIALIS) 5 MG tablet TAKE 1 TABLET(5 MG) BY MOUTH DAILY AS NEEDED FOR ERECTILE DYSFUNCTION 90 tablet 1  . tiZANidine (ZANAFLEX) 4 MG capsule Take 1 capsule (4 mg total) by mouth 3 (three) times daily as needed for muscle spasms. 90 capsule 1  . traZODone (DESYREL) 50 MG tablet TAKE 1 TO 2 TABLETS BY MOUTH AT BEDTIME AS  NEEDED 180 tablet 1  . testosterone cypionate (DEPOTESTOSTERONE CYPIONATE) 200 MG/ML injection Inject 1 mL (200 mg total) into the muscle every 14 (fourteen) days. 10 mL 1  . traMADol (ULTRAM) 50 MG tablet Take 50 mg by mouth daily as needed.     No facility-administered medications prior to visit.    ROS Review of Systems  Constitutional: Positive for fatigue. Negative for appetite change, chills, diaphoresis, fever and unexpected weight change.  Eyes: Negative.   Respiratory: Negative for cough, shortness of breath and wheezing.   Cardiovascular: Negative for chest pain, palpitations and leg swelling.  Gastrointestinal: Negative for abdominal pain, constipation, diarrhea, nausea and vomiting.  Endocrine: Negative.   Genitourinary: Negative.  Negative for difficulty urinating.  Musculoskeletal: Negative for arthralgias, back pain and myalgias.  Skin: Negative.  Negative for color change.  Neurological: Negative for dizziness, weakness, light-headedness and numbness.  Hematological: Negative for adenopathy. Does not bruise/bleed easily.  Psychiatric/Behavioral: Positive for decreased concentration and dysphoric mood. Negative for behavioral problems, confusion, self-injury, sleep disturbance and suicidal ideas. The patient is not nervous/anxious.     Objective:  BP 128/80 (BP Location: Left Arm, Patient Position: Sitting, Cuff Size: Large)   Pulse 60   Temp 98.1 F (36.7 C) (Oral)   Resp 16   Ht 6' (1.829 m)   Wt 183 lb (83 kg)   SpO2  98%   BMI 24.82 kg/m   BP Readings from Last 3 Encounters:  04/02/20 128/80  03/27/20 132/74  09/28/19 120/80    Wt Readings from Last 3 Encounters:  04/02/20 183 lb (83 kg)  03/27/20 179 lb (81.2 kg)  09/28/19 179 lb (81.2 kg)    Physical Exam Vitals reviewed.  Constitutional:      Appearance: Normal appearance.  HENT:     Nose: Nose normal.     Mouth/Throat:     Mouth: Mucous membranes are moist.  Eyes:     General: No scleral  icterus.    Conjunctiva/sclera: Conjunctivae normal.  Cardiovascular:     Rate and Rhythm: Normal rate and regular rhythm.     Heart sounds: No murmur heard.   Pulmonary:     Effort: Pulmonary effort is normal.     Breath sounds: No stridor. No wheezing, rhonchi or rales.  Abdominal:     General: Abdomen is flat. There is no distension.     Palpations: Abdomen is soft.  Musculoskeletal:        General: Normal range of motion.     Cervical back: Neck supple.     Right lower leg: No edema.     Left lower leg: No edema.  Lymphadenopathy:     Cervical: No cervical adenopathy.  Skin:    General: Skin is warm and dry.  Neurological:     General: No focal deficit present.     Mental Status: He is alert.     Lab Results  Component Value Date   WBC 4.3 04/02/2020   HGB 14.8 04/02/2020   HCT 43.2 04/02/2020   PLT 305.0 04/02/2020   GLUCOSE 91 04/02/2020   CHOL 130 09/28/2019   TRIG 129 09/28/2019   HDL 46 09/28/2019   LDLDIRECT 98.0 02/28/2018   LDLCALC 61 09/28/2019   ALT 27 06/14/2019   AST 20 06/14/2019   NA 137 04/02/2020   K 3.9 04/02/2020   CL 99 04/02/2020   CREATININE 0.95 04/02/2020   BUN 23 04/02/2020   CO2 30 04/02/2020   TSH 0.78 06/14/2019   PSA 0.20 06/14/2019   INR 1.0 09/12/2019   HGBA1C 5.3 09/12/2019    MYOCARDIAL PERFUSION IMAGING  Result Date: 07/21/2019  The left ventricular ejection fraction is mildly decreased (45-54%).  Nuclear stress EF: 51%.  No T wave inversion was noted during stress.  There was no ST segment deviation noted during stress.  Defect 1: There is a large defect of moderate severity present in the mid inferoseptal, mid inferior, apical septal and apical inferior location.  This is an intermediate risk study.  Large size, moderate intensity fixed inferior/inferoseptal perfusion defect with normal wall motion, suggestive of artifact or less likely scar. No significant reversible ischemia. LVEF 51% with normal wall motion. This  is an intermediate risk study given the size of the defect.    Assessment & Plan:   Perri was seen today for hypertension.  Diagnoses and all orders for this visit:  Personal history of colonic polyps - adenoma -     Ambulatory referral to Gastroenterology  Essential hypertension- His blood pressure is adequately well controlled. -     CBC with Differential/Platelet; Future -     Basic metabolic panel; Future -     Basic metabolic panel -     CBC with Differential/Platelet  Hypogonadism male- His bioavailable testosterone is low.  He is symptomatic.  Will switch to a daily topical testosterone supplement. -  CBC with Differential/Platelet; Future -     Testosterone Total,Free,Bio, Males; Future -     Testosterone 30 MG/ACT SOLN; Place 2 Act onto the skin daily. -     Testosterone Total,Free,Bio, Males -     CBC with Differential/Platelet   I have discontinued Milbert Coulter [place]'s traMADol and testosterone cypionate. I am also having him start on Testosterone. Additionally, I am having him maintain his B-D 3CC LUER-LOK SYR 18GX1-1/2, BD Disp Needle, CeleBREX, aspirin EC, atorvastatin, tiZANidine, losartan, indapamide, carvedilol, tadalafil, traZODone, and DULoxetine.  Meds ordered this encounter  Medications  . Testosterone 30 MG/ACT SOLN    Sig: Place 2 Act onto the skin daily.    Dispense:  270 mL    Refill:  1     Follow-up: Return in about 6 months (around 10/03/2020).  Scarlette Calico, MD

## 2020-04-03 ENCOUNTER — Telehealth: Payer: Self-pay

## 2020-04-03 ENCOUNTER — Encounter: Payer: Self-pay | Admitting: Internal Medicine

## 2020-04-03 DIAGNOSIS — Z471 Aftercare following joint replacement surgery: Secondary | ICD-10-CM | POA: Diagnosis not present

## 2020-04-03 DIAGNOSIS — Z96651 Presence of right artificial knee joint: Secondary | ICD-10-CM | POA: Diagnosis not present

## 2020-04-03 LAB — TESTOSTERONE TOTAL,FREE,BIO, MALES
Albumin: 4.2 g/dL (ref 3.6–5.1)
Sex Hormone Binding: 52 nmol/L (ref 22–77)
Testosterone, Bioavailable: 68.4 ng/dL — ABNORMAL LOW (ref >=110.0)
Testosterone, Free: 35.5 pg/mL — ABNORMAL LOW (ref 46.0–224.0)
Testosterone: 397 ng/dL (ref 250–827)

## 2020-04-03 NOTE — Telephone Encounter (Signed)
Key: [id number]

## 2020-04-04 NOTE — Telephone Encounter (Signed)
Mateo Flow w/ [employer] is requesting testosterone labs. She said she would be faxing the request over.    Fax: [phone]

## 2020-04-05 NOTE — Telephone Encounter (Signed)
Request was received. T results were faxed back.   Awaiting determination.

## 2020-04-08 ENCOUNTER — Encounter: Payer: Self-pay | Admitting: Internal Medicine

## 2020-04-08 NOTE — Telephone Encounter (Signed)
Approved Effective from 04/07/2020 through 04/07/2021.

## 2020-04-09 ENCOUNTER — Other Ambulatory Visit: Payer: Self-pay | Admitting: Internal Medicine

## 2020-04-09 DIAGNOSIS — E291 Testicular hypofunction: Secondary | ICD-10-CM

## 2020-04-09 MED ORDER — CLOMIPHENE CITRATE 50 MG PO TABS
50.0000 mg | ORAL_TABLET | Freq: Every day | ORAL | 1 refills | Status: DC
Start: 2020-04-09 — End: 2020-10-19

## 2020-04-10 ENCOUNTER — Encounter: Payer: Self-pay | Admitting: Physician Assistant

## 2020-04-20 ENCOUNTER — Other Ambulatory Visit: Payer: Self-pay

## 2020-04-20 ENCOUNTER — Encounter: Payer: Self-pay | Admitting: Internal Medicine

## 2020-04-20 ENCOUNTER — Emergency Department (HOSPITAL_COMMUNITY)
Admission: EM | Admit: 2020-04-20 | Discharge: 2020-04-20 | Disposition: A | Discharge status: Home / Self Care | Payer: BC Managed Care – PPO | Attending: Emergency Medicine | Admitting: Emergency Medicine

## 2020-04-20 DIAGNOSIS — Z7982 Long term (current) use of aspirin: Secondary | ICD-10-CM | POA: Insufficient documentation

## 2020-04-20 DIAGNOSIS — U071 COVID-19: Secondary | ICD-10-CM | POA: Insufficient documentation

## 2020-04-20 DIAGNOSIS — Z87891 Personal history of nicotine dependence: Secondary | ICD-10-CM | POA: Diagnosis not present

## 2020-04-20 DIAGNOSIS — I10 Essential (primary) hypertension: Secondary | ICD-10-CM | POA: Insufficient documentation

## 2020-04-20 DIAGNOSIS — R059 Cough, unspecified: Secondary | ICD-10-CM | POA: Diagnosis not present

## 2020-04-20 MED ORDER — BENZONATATE 100 MG PO CAPS: 100.0000 mg | ORAL_CAPSULE | Freq: Three times a day (TID) | ORAL | 0 refills | Status: DC

## 2020-04-20 MED ORDER — ALBUTEROL SULFATE HFA 108 (90 BASE) MCG/ACT IN AERS
2.0000 | INHALATION_SPRAY | RESPIRATORY_TRACT | Status: DC | PRN
  Administered 2020-04-20: 2 via RESPIRATORY_TRACT
  Filled 2020-04-20: qty 6.7

## 2020-04-20 MED ORDER — NIRMATRELVIR/RITONAVIR (PAXLOVID)TABLET: ORAL_TABLET | ORAL | 0 refills | Status: DC

## 2020-04-20 NOTE — ED Triage Notes (Signed)
Patient reports he hasnt felt well for a couple of days, thought he had sinus infection, did home test for covid today and is positive. Patient says he has felt progressively worse. Denies pain but says he is achy. o2 sat 98% room air. Vs wnl.

## 2020-04-20 NOTE — Discharge Instructions (Addendum)
Recommendations for at home COVID-19 symptoms management:  Please continue isolation at home. Call [phone] to see whether you might be eligible for therapeutic antibody infusions (leave your name and they will call you back).  If have acute worsening of symptoms please go to ER/urgent care for further evaluation. Check pulse oximetry and if below 90-92% please go to ER. The following supplements MAY help:  Vitamin C 500mg  twice a day and Quercetin 250-500 mg twice a day Vitamin D3 2000 - 4000 u/day B Complex vitamins Zinc 75-100 mg/day Melatonin 6-10 mg at night (the optimal dose is unknown) Aspirin 81mg /day (if no history of bleeding issues)  Use albuterol inhaler 2 puffs every 4 hours as needed for shortness of breath.

## 2020-04-20 NOTE — ED Provider Notes (Signed)
[facility] DEPT Provider Note   CSN: [id number] Arrival date & time: 04/20/20  9163     History Chief Complaint  Patient presents with  . covid    Juan [name] is a 65 y.o. male.  The history is provided by the patient and medical records. No language interpreter was used.     65 year old male significant history of hypertension presenting with complaints of Covid infection.  Patient states for the past 4 days he has had body aches, sinus congestion, nonproductive cough, myalgias, fatigue, and states that symptoms seems to be getting progressively worse.  He does endorse shortness of breath usually in the morning when he feels very congested.  He denies loss of taste or smell and denies having fever.  No hemoptysis.  He has been fully vaccinated with booster for Covid.  He is unsure if he has any recent sick contact.  He did try using Mucinex at home with some relief.  Initially did a Covid test 4 days ago that was negative however he repeat another Covid test today which came back positive.  He denies tobacco use.  Past Medical History:  Diagnosis Date  . Hypertension   . Personal history of colonic polyps - adenoma 04/24/2008   04/2008 - diminutive adenoma 06/13/2013      Patient Active Problem List   Diagnosis Date Noted  . Coronary atherosclerosis due to calcified coronary lesion 06/25/2019  . Hyperglyceridemia, pure 01/31/2019  . Current mild episode of major depressive disorder without prior episode ([place]) 02/28/2018  . Degenerative arthritis of knee, bilateral 07/22/2016  . Degenerative arthritis of right knee 10/22/2015  . Herniation of lumbar intervertebral disc 12/05/2014  . Hyperlipidemia LDL goal <130 08/28/2014  . Routine general medical examination at a health care facility 06/25/2010  . Hypogonadism male 05/30/2009  . Erectile dysfunction of organic origin 05/27/2009  . HYPERTRIGLYCERIDEMIA 04/29/2009  . Essential hypertension  04/25/2009  . Personal history of colonic polyps - adenoma 04/24/2008    Past Surgical History:  Procedure Laterality Date  . APPENDECTOMY  2007   ruptured  . COLONOSCOPY         Family History  Problem Relation Age of Onset  . Colon cancer Neg Hx   . Prostate cancer Father   . Heart disease Father     Social History   Tobacco Use  . Smoking status: Former Smoker    Quit date: 01/06/1999    Years since quitting: 21.3  . Smokeless tobacco: Never Used  Substance Use Topics  . Alcohol use: No  . 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 Authorizing Provider  aspirin EC 81 MG tablet Take 1 tablet (81 mg total) by mouth daily. 06/25/19   Janith Lima, MD  atorvastatin (LIPITOR) 40 MG tablet Take 1 tablet (40 mg total) by mouth daily. 07/18/19   Donato Heinz, MD  B-D 3CC LUER-LOK SYR 18GX1-1/2 18G X 1-1/2" 3 ML MISC USE AS DIRECTED FOR TESTOSTERONE INJECTIONS 04/30/18   Janith Lima, MD  BD DISP NEEDLE 23G X 1" MISC USE AS DIRECTED FOR TESTOSTERONE INJECTIONS 04/30/18   Janith Lima, MD  carvedilol (COREG) 3.125 MG tablet TAKE 1 TABLET(3.125 MG) BY MOUTH TWICE DAILY WITH A MEAL 02/08/20   Janith Lima, MD  CELEBREX 200 MG capsule Take 200  mg by mouth daily as needed. 08/24/18   [provider]  clomiPHENE (CLOMID) 50 MG tablet Take 1 tablet (50 mg total) by mouth daily. 04/09/20   Janith Lima, MD  DULoxetine (CYMBALTA) 60 MG capsule TAKE 1 CAPSULE(60 MG) BY MOUTH DAILY 03/09/20   Janith Lima, MD  indapamide (LOZOL) 1.25 MG tablet TAKE 1 TABLET(1.25 MG) BY MOUTH DAILY 12/10/19   Janith Lima, MD  losartan (COZAAR) 100 MG tablet TAKE 1 TABLET(100 MG) BY MOUTH DAILY 12/10/19   Janith Lima, MD  tadalafil (CIALIS) 5 MG tablet TAKE 1 TABLET(5 MG) BY MOUTH DAILY AS NEEDED FOR ERECTILE DYSFUNCTION 02/08/20   Janith Lima, MD  tiZANidine (ZANAFLEX) 4 MG capsule Take 1 capsule (4 mg total) by mouth 3 (three) times daily as needed for muscle spasms. 09/05/19   Janith Lima, MD  traZODone (DESYREL) 50 MG tablet TAKE 1 TO 2 TABLETS BY MOUTH AT BEDTIME AS NEEDED 02/08/20   Janith Lima, MD    Allergies    Ace inhibitors  Review of Systems   Review of Systems  All other systems reviewed and are negative.   Physical Exam Updated Vital Signs BP 127/88 (BP Location: Left Arm)   Pulse 69   Temp 99 F (37.2 C) (Oral)   Resp 20   Ht 6' (1.829 m)   Wt 79.8 kg   SpO2 97%   BMI 23.87 kg/m   Physical Exam Vitals and nursing note reviewed.  Constitutional:      General: He is not in acute distress.    Appearance: He is well-developed.  HENT:     Head: Atraumatic.  Eyes:     Conjunctiva/sclera: Conjunctivae normal.  Cardiovascular:     Rate and Rhythm: Normal rate and regular rhythm.     Pulses: Normal pulses.     Heart sounds: Normal heart sounds.  Pulmonary:     Effort: Pulmonary effort is normal.     Breath sounds: Normal breath sounds. No wheezing, rhonchi or rales.  Abdominal:     Palpations: Abdomen is soft.     Tenderness: There is no abdominal tenderness.  Musculoskeletal:     Cervical back: Neck supple.  Skin:    General: Skin is warm.     Findings: No rash.  Neurological:     Mental Status: He is alert and oriented to person, place, and time.  Psychiatric:        Mood and Affect: Mood normal.     ED Results / Procedures / Treatments   Labs (all labs ordered are listed, but only abnormal results are displayed) Labs Reviewed - No data to display  EKG None  Radiology No results found.  Procedures Procedures   Medications Ordered in ED Medications  albuterol (VENTOLIN HFA) 108 (90 Base) MCG/ACT inhaler 2 puff (has no administration in time range)    ED Course  I have reviewed the triage vital signs and the nursing notes.  Pertinent labs & imaging results  that were available during my care of the patient were reviewed by me and considered in my medical decision making (see chart for details).    MDM Rules/Calculators/A&P                          BP 127/88 (BP Location: Left Arm)   Pulse 69   Temp 99 F (37.2 C) (Oral)   Resp 20   Ht 6' (1.829  m)   Wt 79.8 kg   SpO2 97%   BMI 23.87 kg/m   Final Clinical Impression(s) / ED Diagnoses Final diagnoses:  COVID-19 virus infection    Rx / DC Orders ED Discharge Orders         Ordered    nirmatrelvir/ritonavir EUA (PAXLOVID) TABS        04/20/20 1141    benzonatate (TESSALON) 100 MG capsule  Every 8 hours        04/20/20 1141         Patient who has had COVID vaccination including booster who had a positive Covid test at home after having Covid related symptoms for the past 4 days.  He does have history of hypertension which put him at a higher risk.  Fortunately he is not hypoxic and overall well-appearing.  He is amenable for oral antiviral medication to treat Covid as he disqualifies fluid.  Will prescribe Paxlovid as well as cough medication and medication to help with his symptoms.  Return precaution given.  I did discuss option of obtaining another Covid test to confirm but we both felt his symptom is consistent with Covid infection.  Juan [name] was evaluated in Emergency Department on 04/20/2020 for the symptoms described in the history of present illness. He was evaluated in the context of the global COVID-19 pandemic, which necessitated consideration that the patient might be at risk for infection with the SARS-CoV-2 virus that causes COVID-19. Institutional protocols and algorithms that pertain to the evaluation of patients at risk for COVID-19 are in a state of rapid change based on information released by regulatory bodies including the CDC and federal and state organizations. These policies and algorithms were followed during the patient's care in the ED.    Domenic Moras,  PA-C 04/20/20 1143    Arnaldo Natal, MD 04/20/20 [phone]

## 2020-04-22 ENCOUNTER — Telehealth: Payer: Self-pay | Admitting: Internal Medicine

## 2020-04-22 ENCOUNTER — Other Ambulatory Visit: Payer: Self-pay | Admitting: Internal Medicine

## 2020-04-22 DIAGNOSIS — I10 Essential (primary) hypertension: Secondary | ICD-10-CM

## 2020-04-22 MED ORDER — INDAPAMIDE 1.25 MG PO TABS: 1.2500 mg | ORAL_TABLET | Freq: Every day | ORAL | 1 refills | Status: DC

## 2020-04-22 NOTE — Telephone Encounter (Signed)
[facility] FYI on 04/20/20  Caller states is positive for COVID, sore throat feels like is closed, congestion, denies fever, deep cough, body aches, weak, having some shortness of breath worse in mornings when wakes up and gets better during the day. Mucinex and Tylenol for symptoms.   Advised to go to ED. Patient understood and agreed.

## 2020-04-23 ENCOUNTER — Telehealth (INDEPENDENT_AMBULATORY_CARE_PROVIDER_SITE_OTHER): Payer: BC Managed Care – PPO | Admitting: Nurse Practitioner

## 2020-04-23 DIAGNOSIS — U071 COVID-19: Secondary | ICD-10-CM

## 2020-04-23 HISTORY — DX: COVID-19: U07.1

## 2020-04-23 MED ORDER — PREDNISONE 10 MG PO TABS: ORAL_TABLET | ORAL | 0 refills | Status: DC

## 2020-04-23 NOTE — Patient Instructions (Signed)
Covid 19 Congestion:   Stay well hydrated  Stay active  Deep breathing exercises  May take tylenol or fever or pain  May take mucinex DM twice daily    Follow up:  Follow up in 2 weeks or sooner if needed

## 2020-04-23 NOTE — Progress Notes (Signed)
Virtual Visit via Telephone Note  I connected with Juan [name] on 04/23/20 at 10:30 AM EDT by telephone and verified that I am speaking with the correct person using two identifiers.  Location: Patient: home Provider: office   I discussed the limitations, risks, security and privacy concerns of performing an evaluation and management service by telephone and the availability of in person appointments. I also discussed with the patient that there may be a patient responsible charge related to this service. The patient expressed understanding and agreed to proceed.    History of Present Illness:  Patient presents today for post-COVID care clinic visit through televisit.  Patient has a positive for COVID on 04/20/2020.  Patient states that overall he is doing well.  He was seen in the ED on 04/20/2020 and was prescribed oral antiviral therapy.  He states that he does still have some ongoing congestion.  He is taking Mucinex and states that this is helping to relieve his symptoms.  He denies any recent significant fever or shortness of breath. Denies f/c/s, n/v/d, hemoptysis, PND, chest pain or edema.     Observations/Objective:  Vitals with BMI 04/20/2020 04/20/2020 04/20/2020  Height - - 6\' 0"   Weight - - 176 lbs  BMI - - 38.17  Systolic [phone]  Diastolic 81 75 88  Pulse 60 61 69      Assessment and Plan:  Covid 19 Congestion:   Stay well hydrated  Stay active  Deep breathing exercises  May take tylenol or fever or pain  May take mucinex DM twice daily    Follow up:  Follow up in 2 weeks or sooner if needed        I discussed the assessment and treatment plan with the patient. The patient was provided an opportunity to ask questions and all were answered. The patient agreed with the plan and demonstrated an understanding of the instructions.   The patient was advised to call back or seek an in-person evaluation if the symptoms worsen or if the condition fails  to improve as anticipated.  I provided 23 minutes of non-face-to-face time during this encounter.   Fenton Foy, NP

## 2020-04-26 ENCOUNTER — Ambulatory Visit: Payer: BC Managed Care – PPO | Admitting: Physician Assistant

## 2020-04-30 ENCOUNTER — Encounter: Payer: Self-pay | Admitting: Internal Medicine

## 2020-05-03 ENCOUNTER — Encounter: Payer: Self-pay | Admitting: Internal Medicine

## 2020-05-04 DIAGNOSIS — M545 Low back pain, unspecified: Secondary | ICD-10-CM | POA: Diagnosis not present

## 2020-05-07 ENCOUNTER — Ambulatory Visit: Payer: BC Managed Care – PPO

## 2020-05-08 ENCOUNTER — Ambulatory Visit (INDEPENDENT_AMBULATORY_CARE_PROVIDER_SITE_OTHER): Payer: BC Managed Care – PPO | Admitting: Physician Assistant

## 2020-05-08 ENCOUNTER — Other Ambulatory Visit: Payer: Self-pay | Admitting: Internal Medicine

## 2020-05-08 ENCOUNTER — Encounter: Payer: Self-pay | Admitting: Physician Assistant

## 2020-05-08 VITALS — BP 134/84 | HR 66 | Ht 72.0 in | Wt 180.1 lb

## 2020-05-08 DIAGNOSIS — Z86010 Personal history of colonic polyps: Secondary | ICD-10-CM

## 2020-05-08 DIAGNOSIS — I10 Essential (primary) hypertension: Secondary | ICD-10-CM

## 2020-05-08 DIAGNOSIS — K625 Hemorrhage of anus and rectum: Secondary | ICD-10-CM | POA: Diagnosis not present

## 2020-05-08 MED ORDER — NA SULFATE-K SULFATE-MG SULF 17.5-3.13-1.6 GM/177ML PO SOLN: 1.0000 | Freq: Once | ORAL | 0 refills | Status: AC

## 2020-05-08 NOTE — Progress Notes (Signed)
Subjective:    Patient ID: Juan [name], male    DOB: [birth year], 65 y.o.   MRN: [id number]  HPI   Juan [name] is a pleasant 65 year old white male, known to Dr. Carlean Purl who comes in today with complaint of intermittent rectal bleeding. Patient was last seen here in June 2015 when he had colonoscopy for follow-up of adenomatous colon polyps.  He was found to have 1 5 mm polyp which was removed and was a tubular adenoma and was recommended for 5-year interval follow-up. No internal hemorrhoids documented. Patient has history of hypertension, osteoarthritis, and hyperlipidemia.  He has been on chronic Celebrex. Family history negative for colon cancer. Patient says he has been noticing intermittent small-volume bright red blood over the past Few months.  This is been blood noted on the tissue after bowel movements.  He has not noted any blood in the commode or any melena.  He has not seen blood mixed with the bowel movements.  He says he has very regular bowel movements and maintains a high-fiber diet.  He has no complaints of abdominal pain, no rectal pain or discomfort though says occasionally he gets a little anal rectal irritation.  Does not feel that he has any external hemorrhoids.  He has been using Preparation H periodically which seems to be helpful.  He says he may see blood 2 or 3 times per week.  Review of Systems Pertinent positive and negative review of systems were noted in the above HPI section.  All other review of systems was otherwise negative.  Outpatient Encounter Medications as of 05/08/2020  Medication Sig  . aspirin EC 81 MG tablet Take 1 tablet (81 mg total) by mouth daily.  Marland Kitchen atorvastatin (LIPITOR) 40 MG tablet Take 1 tablet (40 mg total) by mouth daily.  . B-D 3CC LUER-LOK SYR 18GX1-1/2 18G X 1-1/2" 3 ML MISC USE AS DIRECTED FOR TESTOSTERONE INJECTIONS  . BD DISP NEEDLE 23G X 1" MISC USE AS DIRECTED FOR TESTOSTERONE INJECTIONS  . benzonatate (TESSALON) 100 MG capsule Take 1  capsule (100 mg total) by mouth every 8 (eight) hours.  . carvedilol (COREG) 3.125 MG tablet TAKE 1 TABLET(3.125 MG) BY MOUTH TWICE DAILY WITH A MEAL  . CELEBREX 200 MG capsule Take 200 mg by mouth daily as needed.  . clomiPHENE (CLOMID) 50 MG tablet Take 1 tablet (50 mg total) by mouth daily.  . DULoxetine (CYMBALTA) 60 MG capsule TAKE 1 CAPSULE(60 MG) BY MOUTH DAILY  . indapamide (LOZOL) 1.25 MG tablet Take 1 tablet (1.25 mg total) by mouth daily.  Marland Kitchen losartan (COZAAR) 100 MG tablet TAKE 1 TABLET(100 MG) BY MOUTH DAILY  . Na Sulfate-K Sulfate-Mg Sulf 17.5-3.13-1.6 GM/177ML SOLN Take 1 kit by mouth once for 1 dose.  . predniSONE (DELTASONE) 10 MG tablet Take 4 tabs for 2 days, then 3 tabs for 2 days, then 2 tabs for 2 days, then 1 tab for 2 days, then stop  . shark liver oil-cocoa butter (PREPARATION H) 0.25-88.44 % suppository Place 1 suppository rectally as needed for hemorrhoids.  . tadalafil (CIALIS) 5 MG tablet TAKE 1 TABLET(5 MG) BY MOUTH DAILY AS NEEDED FOR ERECTILE DYSFUNCTION  . tiZANidine (ZANAFLEX) 4 MG capsule Take 1 capsule (4 mg total) by mouth 3 (three) times daily as needed for muscle spasms.  . traZODone (DESYREL) 50 MG tablet TAKE 1 TO 2 TABLETS BY MOUTH AT BEDTIME AS NEEDED  . [DISCONTINUED] carvedilol (COREG) 3.125 MG tablet TAKE 1 TABLET(3.125 MG) BY MOUTH TWICE  DAILY WITH A MEAL  . [DISCONTINUED] nirmatrelvir/ritonavir EUA (PAXLOVID) TABS Patient GFR is 84. 329m nirmatrelvir (two 1544mtablets) with 10044mitonavir (one 100m2mblet), with all three tablets taken together twice daily for 5 days. (Patient not taking: Reported on 05/08/2020)   No facility-administered encounter medications on file as of 05/08/2020.   Allergies  Allergen Reactions  . Ace Inhibitors     REACTION: cough   Patient Active Problem List   Diagnosis Date Noted  . COVID-19 04/23/2020  . Coronary atherosclerosis due to calcified coronary lesion 06/25/2019  . Hyperglyceridemia, pure 01/31/2019  .  Current mild episode of major depressive disorder without prior episode ([facility])[place]/24/2020  . Degenerative arthritis of knee, bilateral 07/22/2016  . Degenerative arthritis of right knee 10/22/2015  . Herniation of lumbar intervertebral disc 12/05/2014  . Hyperlipidemia LDL goal <130 08/28/2014  . Routine general medical examination at a health care facility 06/25/2010  . Hypogonadism male 05/30/2009  . Erectile dysfunction of organic origin 05/27/2009  . HYPERTRIGLYCERIDEMIA 04/29/2009  . Essential hypertension 04/25/2009  . Personal history of colonic polyps - adenoma 04/24/2008   Social History   Socioeconomic History  . Marital status: Married    Spouse name: Not on file  . Number of children: Not on file  . Years of education: Not on file  . Highest education level: Not on file  Occupational History  . Not on file  Tobacco Use  . Smoking status: Former Smoker    Types: Cigarettes    Quit date: 01/06/1999    Years since quitting: 21.3  . Smokeless tobacco: Never Used  Vaping Use  . Vaping Use: Never used  Substance and Sexual Activity  . Alcohol use: No  . Drug use: No    Comment: clean and sober for 73yr52yrer being addicted to cocaine  . Sexual activity: Yes  Other Topics Concern  . Not on file  Social History Narrative   Regular exercise-Yes   He did rehab 12 years ago and has screened several times for HIV and Hep A/B/C and always negative.   Social Determinants of Health   Financial Resource Strain: Not on file  Food Insecurity: Not on file  Transportation Needs: Not on file  Physical Activity: Not on file  Stress: Not on file  Social Connections: Not on file  Intimate Partner Violence: Not on file    Mr. Lizardo's family history includes Heart disease in his father; Prostate cancer in his father.      Objective:    Vitals:   05/08/20 0929  BP: 134/84  Pulse: 66    Physical Exam Well-developed well-nourished W male in no acute distress.  Height,  Weight, BMI 24.4  HEENT; nontraumatic normocephalic, EOMI, PE R LA, sclera anicteric. Oropharynx;not examined Neck; supple, no JVD Cardiovascular; regular rate and rhythm with S1-S2, no murmur rub or gallop Pulmonary; Clear bilaterally Abdomen; soft, nontender, nondistended, no palpable mass or hepatosplenomegaly, bowel sounds are active Rectal;not done today Skin; benign exam, no jaundice rash or appreciable lesions Extremities; no clubbing cyanosis or edema skin warm and dry Neuro/Psych; alert and oriented x4, grossly nonfocal mood and affect appropriate       Assessment & Plan:   #1 65 60e35 old white male with history of adenomatous colon polyps, who presents with intermittent small-volume hematochezia with bright red blood noted primarily on the tissue intermittently over the past few months.  Etiology of bleeding is not entirely clear, no history of internal hemorrhoids though he may have  developed internal hemorrhoids since last colonoscopy, rule out rectal neoplasm, rule out rectal polyp.  Rule out other occult colon lesion.  #2 history of adenomatous colon polyps-last colonoscopy June 2015 with removal of one 5 mm tubular adenoma and indicated for 5-year interval follow-up #3 osteoarthritis #4.  Hypertension #5.  Hyperlipidemia  Plan; patient will be scheduled for colonoscopy with Dr. Carlean Purl on 06/01/2019.  Procedure was discussed in detail with patient including indications risks and benefits and he is agreeable to proceed. Further recommendations pending findings at colonoscopy.  Tray Klayman Genia Harold PA-C 05/08/2020   Cc: Janith Lima, MD

## 2020-05-08 NOTE — Patient Instructions (Signed)
If you are age 65 or older, your body mass index should be between 23-30. Your Body mass index is 24.43 kg/m. If this is out of the aforementioned range listed, please consider follow up with your Primary Care Provider.  You have been scheduled for a colonoscopy. Please follow written instructions given to you at your visit today.  Please pick up your prep supplies at the pharmacy within the next 1-3 days. If you use inhalers (even only as needed), please bring them with you on the day of your procedure.  Follow up pending the results of your Colonoscopy or as needed.  Thank you for entrusting me with your care and choosing [facility].  Amy Esterwood, PA-C

## 2020-05-27 ENCOUNTER — Encounter: Payer: Self-pay | Admitting: Internal Medicine

## 2020-05-29 ENCOUNTER — Telehealth: Payer: Self-pay | Admitting: Internal Medicine

## 2020-05-29 NOTE — Telephone Encounter (Signed)
OK No charge 

## 2020-05-29 NOTE — Telephone Encounter (Signed)
Good morning Dr. Carlean Purl,   Patient called in to cancel procedure 06-14-2022. Death in the family and have to fly out of town. Rescheduled for 08/16/20.

## 2020-05-31 ENCOUNTER — Encounter: Payer: BC Managed Care – PPO | Admitting: Internal Medicine

## 2020-06-07 ENCOUNTER — Other Ambulatory Visit: Payer: Self-pay | Admitting: Internal Medicine

## 2020-06-07 DIAGNOSIS — I10 Essential (primary) hypertension: Secondary | ICD-10-CM

## 2020-07-07 ENCOUNTER — Other Ambulatory Visit: Payer: Self-pay | Admitting: Cardiology

## 2020-07-07 ENCOUNTER — Other Ambulatory Visit: Payer: Self-pay | Admitting: Internal Medicine

## 2020-07-07 DIAGNOSIS — I2584 Coronary atherosclerosis due to calcified coronary lesion: Secondary | ICD-10-CM

## 2020-08-06 ENCOUNTER — Other Ambulatory Visit: Payer: Self-pay | Admitting: Internal Medicine

## 2020-08-06 DIAGNOSIS — F409 Phobic anxiety disorder, unspecified: Secondary | ICD-10-CM

## 2020-08-06 DIAGNOSIS — I10 Essential (primary) hypertension: Secondary | ICD-10-CM

## 2020-08-16 ENCOUNTER — Ambulatory Visit (AMBULATORY_SURGERY_CENTER): Payer: Medicare Other | Admitting: Internal Medicine

## 2020-08-16 ENCOUNTER — Other Ambulatory Visit: Payer: Self-pay

## 2020-08-16 ENCOUNTER — Encounter: Payer: Self-pay | Admitting: Internal Medicine

## 2020-08-16 VITALS — BP 118/75 | HR 48 | Temp 98.2°F | Resp 10 | Ht 72.0 in | Wt 180.0 lb

## 2020-08-16 DIAGNOSIS — K625 Hemorrhage of anus and rectum: Secondary | ICD-10-CM

## 2020-08-16 DIAGNOSIS — K5730 Diverticulosis of large intestine without perforation or abscess without bleeding: Secondary | ICD-10-CM | POA: Diagnosis not present

## 2020-08-16 DIAGNOSIS — K648 Other hemorrhoids: Secondary | ICD-10-CM

## 2020-08-16 DIAGNOSIS — Z86010 Personal history of colonic polyps: Secondary | ICD-10-CM

## 2020-08-16 MED ORDER — SODIUM CHLORIDE 0.9 % IV SOLN: 500.0000 mL | Freq: Once | INTRAVENOUS | Status: DC

## 2020-08-16 NOTE — Patient Instructions (Addendum)
No polyps or cancer seen.  As we thought the bleeding has been from hemorrhoids. If it becomes more of a problem I can treat with rubber bands. See the handout.  You do have diverticulosis - thickened muscle rings and pouches in the colon wall. Please read the handout about this condition.  Next routine colonoscopy in 10 years - 2032.  I appreciate the opportunity to care for you. Gatha Mayer, MD, [facility]  Resume previous diet Continue current medications Repeat colonoscopy in 10 years  YOU HAD AN ENDOSCOPIC PROCEDURE TODAY AT [facility]:   Refer to the procedure report that was given to you for any specific questions about what was found during the examination.  If the procedure report does not answer your questions, please call your gastroenterologist to clarify.  If you requested that your care partner not be given the details of your procedure findings, then the procedure report has been included in a sealed envelope for you to review at your convenience later.  YOU SHOULD EXPECT: Some feelings of bloating in the abdomen. Passage of more gas than usual.  Walking can help get rid of the air that was put into your GI tract during the procedure and reduce the bloating. If you had a lower endoscopy (such as a colonoscopy or flexible sigmoidoscopy) you may notice spotting of blood in your stool or on the toilet paper. If you underwent a bowel prep for your procedure, you may not have a normal bowel movement for a few days.  Please Note:  You might notice some irritation and congestion in your nose or some drainage.  This is from the oxygen used during your procedure.  There is no need for concern and it should clear up in a day or so.  SYMPTOMS TO REPORT IMMEDIATELY:  Following lower endoscopy (colonoscopy or flexible sigmoidoscopy):  Excessive amounts of blood in the stool  Significant tenderness or worsening of abdominal pains  Swelling of the abdomen that is new,  acute  Fever of 100F or higher  For urgent or emergent issues, a gastroenterologist can be reached at any hour by calling [phone]. Do not use MyChart messaging for urgent concerns.   DIET:  We do recommend a small meal at first, but then you may proceed to your regular diet.  Drink plenty of fluids but you should avoid alcoholic beverages for 24 hours.  ACTIVITY:  You should plan to take it easy for the rest of today and you should NOT DRIVE or use heavy machinery until tomorrow (because of the sedation medicines used during the test).    FOLLOW UP: Our staff will call the number listed on your records 48-72 hours following your procedure to check on you and address any questions or concerns that you may have regarding the information given to you following your procedure. If we do not reach you, we will leave a message.  We will attempt to reach you two times.  During this call, we will ask if you have developed any symptoms of COVID 19. If you develop any symptoms (ie: fever, flu-like symptoms, shortness of breath, cough etc.) before then, please call [phone].  If you test positive for Covid 19 in the 2 weeks post procedure, please call and report this information to [place].    If any biopsies were taken you will be contacted by phone or by letter within the next 1-3 weeks.  Please call us at [phone] if you have  not heard about the biopsies in 3 weeks.   SIGNATURES/CONFIDENTIALITY: You and/or your care partner have signed paperwork which will be entered into your electronic medical record.  These signatures attest to the fact that that the information above on your After Visit Summary has been reviewed and is understood.  Full responsibility of the confidentiality of this discharge information lies with you and/or your care-partner.

## 2020-08-16 NOTE — Progress Notes (Signed)
[facility] History and Physical   Primary Care Physician:  Janith Lima, MD   Reason for Procedure:   Rectal bleeding and hx colon polyps  Plan:    colonoscopoy     HPI: Juan [name] is a 65 y.o. male w/ hx rectal bleeding and hx colon polyps   Past Medical History:  Diagnosis Date   Hypertension    Personal history of colonic polyps - adenoma 04/24/2008   04/2008 - diminutive adenoma 06/13/2013      Past Surgical History:  Procedure Laterality Date   APPENDECTOMY  2007   ruptured   COLONOSCOPY      Prior to Admission medications   Medication Sig Start Date End Date Taking? Authorizing Provider  aspirin 81 MG EC tablet TAKE 1 TABLET(81 MG) BY MOUTH DAILY 07/07/20  Yes Janith Lima, MD  atorvastatin (LIPITOR) 40 MG tablet TAKE 1 TABLET(40 MG) BY MOUTH DAILY 07/10/20  Yes Donato Heinz, MD  carvedilol (COREG) 3.125 MG tablet TAKE 1 TABLET(3.125 MG) BY MOUTH TWICE DAILY WITH A MEAL 08/06/20  Yes Janith Lima, MD  CELEBREX 200 MG capsule Take 200 mg by mouth daily as needed. 08/24/18  Yes [provider]  clomiPHENE (CLOMID) 50 MG tablet Take 1 tablet (50 mg total) by mouth daily. 04/09/20  Yes Janith Lima, MD  DULoxetine (CYMBALTA) 60 MG capsule TAKE 1 CAPSULE(60 MG) BY MOUTH DAILY 03/09/20  Yes Janith Lima, MD  indapamide (LOZOL) 1.25 MG tablet Take 1 tablet (1.25 mg total) by mouth daily. 04/22/20  Yes Janith Lima, MD  losartan (COZAAR) 100 MG tablet TAKE 1 TABLET(100 MG) BY MOUTH DAILY 06/07/20  Yes Janith Lima, MD  traZODone (DESYREL) 50 MG tablet TAKE 1 TO 2 TABLETS BY MOUTH AT BEDTIME AS NEEDED 08/06/20  Yes Janith Lima, MD  benzonatate (TESSALON) 100 MG capsule Take 1 capsule (100 mg total) by mouth every 8 (eight) hours. Patient not taking: Reported on 08/16/2020 04/20/20   Domenic Moras, PA-C  shark liver oil-cocoa butter (PREPARATION H) 0.25-88.44 % suppository Place 1 suppository rectally as needed for hemorrhoids.    [provider]  tadalafil (CIALIS) 5 MG tablet TAKE 1 TABLET(5 MG) BY MOUTH DAILY AS NEEDED FOR ERECTILE DYSFUNCTION 02/08/20   Janith Lima, MD  tiZANidine (ZANAFLEX) 4 MG capsule Take 1 capsule (4 mg total) by mouth 3 (three) times daily as needed for muscle spasms. 09/05/19   Janith Lima, MD    Current Outpatient Medications  Medication Sig Dispense Refill   aspirin 81 MG EC tablet TAKE 1 TABLET(81 MG) BY MOUTH DAILY 90 tablet 3   atorvastatin (LIPITOR) 40 MG tablet TAKE 1 TABLET(40 MG) BY MOUTH DAILY 90 tablet 3   carvedilol (COREG) 3.125 MG tablet TAKE 1 TABLET(3.125 MG) BY MOUTH TWICE DAILY WITH A MEAL 180 tablet 0   CELEBREX 200 MG capsule Take 200 mg by mouth daily as needed.     clomiPHENE (CLOMID) 50 MG tablet Take 1 tablet (50 mg total) by mouth daily. 90 tablet 1   DULoxetine (CYMBALTA) 60 MG capsule TAKE 1 CAPSULE(60 MG) BY MOUTH DAILY 90 capsule 1   indapamide (LOZOL) 1.25 MG tablet Take 1 tablet (1.25 mg total) by mouth daily. 90 tablet 1   losartan (COZAAR) 100 MG tablet TAKE 1 TABLET(100 MG) BY MOUTH DAILY 90 tablet 1   traZODone (DESYREL) 50 MG tablet TAKE 1 TO 2 TABLETS BY MOUTH AT BEDTIME AS NEEDED 180  tablet 1   benzonatate (TESSALON) 100 MG capsule Take 1 capsule (100 mg total) by mouth every 8 (eight) hours. (Patient not taking: Reported on 08/16/2020) 21 capsule 0   shark liver oil-cocoa butter (PREPARATION H) 0.25-88.44 % suppository Place 1 suppository rectally as needed for hemorrhoids.     tadalafil (CIALIS) 5 MG tablet TAKE 1 TABLET(5 MG) BY MOUTH DAILY AS NEEDED FOR ERECTILE DYSFUNCTION 90 tablet 1   tiZANidine (ZANAFLEX) 4 MG capsule Take 1 capsule (4 mg total) by mouth 3 (three) times daily as needed for muscle spasms. 90 capsule 1   Current Facility-Administered Medications  Medication Dose Route Frequency Provider Last Rate Last Admin   0.9 %  sodium chloride infusion  500 mL Intravenous Once Gatha Mayer, MD        Allergies as of 08/16/2020 - Review  Complete 08/16/2020  Allergen Reaction Noted   Ace inhibitors  04/25/2009    Family History  Problem Relation Age of Onset   Prostate cancer Father    Heart disease Father    Colon cancer Neg Hx    Esophageal cancer Neg Hx    Pancreatic cancer Neg Hx    Stomach cancer Neg Hx     Social History   Socioeconomic History   Marital status: Married    Spouse name: Not on file   Number of children: Not on file   Years of education: Not on file   Highest education level: Not on file  Occupational History   Not on file  Tobacco Use   Smoking status: Former    Types: Cigarettes    Quit date: 01/06/1999    Years since quitting: 21.6   Smokeless tobacco: Never  Vaping Use   Vaping Use: Never used  Substance and Sexual Activity   Alcohol use: No   Drug use: No    Comment: 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   Social Determinants of Health   Financial Resource Strain: Not on file  Food Insecurity: Not on file  Transportation Needs: Not on file  Physical Activity: Not on file  Stress: Not on file  Social Connections: Not on file  Intimate Partner Violence: Not on file    Review of Systems:  All other review of systems negative except as mentioned in the HPI.  Physical Exam: Vital signs BP (!) 79/53   Pulse (!) 57   Temp 98.2 F (36.8 C) (Temporal)   Ht 6' (1.829 m)   Wt 180 lb (81.6 kg)   SpO2 95%   BMI 24.41 kg/m   General:   Alert,  Well-developed, well-nourished, pleasant and cooperative in NAD Lungs:  Clear throughout to auscultation.   Heart:  Regular rate and rhythm; no murmurs, clicks, rubs,  or gallops. Abdomen:  Soft, nontender and nondistended. Normal bowel sounds.   Neuro/Psych:  Alert and cooperative. Normal mood and  affect. A and O x 3   '@Lanessa Shill'$  ESimonne Maffucci MD, [facility] [phone](pager) 08/16/2020 9:33 AM@

## 2020-08-16 NOTE — Op Note (Signed)
[facility] Patient Name: Juan [name] Procedure Date: 08/16/2020 9:22 AM MRN: [id number] Endoscopist: Gatha Mayer , MD Age: 65 Referring MD:  Date of Birth: 04/27/1955 Gender: Male Account #: [id number] Procedure:                Colonoscopy Indications:              Rectal bleeding, Personal history of colonic polyps Medicines:                Propofol per Anesthesia, Monitored Anesthesia Care Procedure:                Pre-Anesthesia Assessment:                           - Prior to the procedure, a History and Physical                            was performed, and patient medications and                            allergies were reviewed. The patient's tolerance of                            previous anesthesia was also reviewed. The risks                            and benefits of the procedure and the sedation                            options and risks were discussed with the patient.                            All questions were answered, and informed consent                            was obtained. Prior Anticoagulants: The patient has                            taken no previous anticoagulant or antiplatelet                            agents. ASA Grade Assessment: II - A patient with                            mild systemic disease. After reviewing the risks                            and benefits, the patient was deemed in                            satisfactory condition to undergo the procedure.                           After obtaining informed consent, the colonoscope  was passed under direct vision. Throughout the                            procedure, the patient's blood pressure, pulse, and                            oxygen saturations were monitored continuously. The                            CF [id number] was introduced through the anus                            and advanced to the the cecum, identified by                             appendiceal orifice and ileocecal valve. The                            colonoscopy was performed without difficulty. The                            patient tolerated the procedure well. The quality                            of the bowel preparation was adequate. The                            ileocecal valve, appendiceal orifice, and rectum                            were photographed. The bowel preparation used was                            SUPREP via split dose instruction. Scope In: 9:38:24 AM Scope Out: 9:52:46 AM Scope Withdrawal Time: 0 hours 11 minutes 53 seconds  Total Procedure Duration: 0 hours 14 minutes 22 seconds  Findings:                 The perianal and digital rectal examinations were                            normal. Pertinent negatives include normal prostate                            (size, shape, and consistency).                           Internal hemorrhoids were found.                           Multiple diverticula were found in the sigmoid                            colon.  The exam was otherwise without abnormality on                            direct and retroflexion views. Complications:            No immediate complications. Estimated Blood Loss:     Estimated blood loss: none. Impression:               - Internal hemorrhoids.                           - Diverticulosis in the sigmoid colon.                           - The examination was otherwise normal on direct                            and retroflexion views.                           - No specimens collected. Recommendation:           - Patient has a contact number available for                            emergencies. The signs and symptoms of potential                            delayed complications were discussed with the                            patient. Return to normal activities tomorrow.                            Written discharge instructions were  provided to the                            patient.                           - Resume previous diet.                           - Continue present medications.                           - Repeat colonoscopy in 10 years. Gatha Mayer, MD 08/16/2020 10:04:00 AM This report has been signed electronically.

## 2020-08-16 NOTE — Progress Notes (Signed)
A/ox3, pleased with MAC, report to RN 

## 2020-08-16 NOTE — Progress Notes (Signed)
Pt's states no medical or surgical changes since previsit or office visit. 

## 2020-08-20 ENCOUNTER — Telehealth: Payer: Self-pay

## 2020-08-20 NOTE — Telephone Encounter (Signed)
Left message on answering machine. 

## 2020-08-26 ENCOUNTER — Encounter: Payer: Self-pay | Admitting: Internal Medicine

## 2020-09-03 ENCOUNTER — Encounter: Payer: Self-pay | Admitting: Internal Medicine

## 2020-09-03 ENCOUNTER — Ambulatory Visit (INDEPENDENT_AMBULATORY_CARE_PROVIDER_SITE_OTHER): Payer: Medicare Other | Admitting: Internal Medicine

## 2020-09-03 ENCOUNTER — Other Ambulatory Visit: Payer: Self-pay

## 2020-09-03 VITALS — BP 136/78 | HR 90 | Temp 98.1°F | Resp 16 | Ht 72.0 in | Wt 182.0 lb

## 2020-09-03 DIAGNOSIS — N529 Male erectile dysfunction, unspecified: Secondary | ICD-10-CM | POA: Diagnosis not present

## 2020-09-03 DIAGNOSIS — E785 Hyperlipidemia, unspecified: Secondary | ICD-10-CM

## 2020-09-03 DIAGNOSIS — Z0000 Encounter for general adult medical examination without abnormal findings: Secondary | ICD-10-CM | POA: Diagnosis not present

## 2020-09-03 DIAGNOSIS — E781 Pure hyperglyceridemia: Secondary | ICD-10-CM | POA: Diagnosis not present

## 2020-09-03 DIAGNOSIS — E291 Testicular hypofunction: Secondary | ICD-10-CM | POA: Diagnosis not present

## 2020-09-03 DIAGNOSIS — Z23 Encounter for immunization: Secondary | ICD-10-CM

## 2020-09-03 DIAGNOSIS — I10 Essential (primary) hypertension: Secondary | ICD-10-CM

## 2020-09-03 LAB — CBC WITH DIFFERENTIAL/PLATELET
Basophils Absolute: 0.1 10*3/uL (ref 0.0–0.1)
Basophils Relative: 1.3 % (ref 0.0–3.0)
Eosinophils Absolute: 0.2 10*3/uL (ref 0.0–0.7)
Eosinophils Relative: 3.9 % (ref 0.0–5.0)
HCT: 39.2 % (ref 39.0–52.0)
Hemoglobin: 13.3 g/dL (ref 13.0–17.0)
Lymphocytes Relative: 28.6 % (ref 12.0–46.0)
Lymphs Abs: 1.2 10*3/uL (ref 0.7–4.0)
MCHC: 33.9 g/dL (ref 30.0–36.0)
MCV: 88.9 fl (ref 78.0–100.0)
Monocytes Absolute: 0.4 10*3/uL (ref 0.1–1.0)
Monocytes Relative: 9.6 % (ref 3.0–12.0)
Neutro Abs: 2.3 10*3/uL (ref 1.4–7.7)
Neutrophils Relative %: 56.6 % (ref 43.0–77.0)
Platelets: 268 10*3/uL (ref 150.0–400.0)
RBC: 4.41 Mil/uL (ref 4.22–5.81)
RDW: 12.9 % (ref 11.5–15.5)
WBC: 4.1 10*3/uL (ref 4.0–10.5)

## 2020-09-03 LAB — LIPID PANEL
Cholesterol: 135 mg/dL (ref 0–200)
HDL: 51 mg/dL (ref >39.00)
LDL Cholesterol: 63 mg/dL (ref 0–99)
NonHDL: 84.35
Total CHOL/HDL Ratio: 3
Triglycerides: 105 mg/dL (ref 0.0–149.0)
VLDL: 21 mg/dL (ref 0.0–40.0)

## 2020-09-03 LAB — HEPATIC FUNCTION PANEL
ALT: 18 U/L (ref 0–53)
AST: 18 U/L (ref 0–37)
Albumin: 4.1 g/dL (ref 3.5–5.2)
Alkaline Phosphatase: 22 U/L — ABNORMAL LOW (ref 39–117)
Bilirubin, Direct: 0.1 mg/dL (ref 0.0–0.3)
Total Bilirubin: 0.5 mg/dL (ref 0.2–1.2)
Total Protein: 6.5 g/dL (ref 6.0–8.3)

## 2020-09-03 LAB — BASIC METABOLIC PANEL
BUN: 24 mg/dL — ABNORMAL HIGH (ref 6–23)
CO2: 27 mEq/L (ref 19–32)
Calcium: 9 mg/dL (ref 8.4–10.5)
Chloride: 102 mEq/L (ref 96–112)
Creatinine, Ser: 0.94 mg/dL (ref 0.40–1.50)
GFR: 85.17 mL/min (ref >60.00)
Glucose, Bld: 90 mg/dL (ref 70–99)
Potassium: 4.2 mEq/L (ref 3.5–5.1)
Sodium: 136 mEq/L (ref 135–145)

## 2020-09-03 LAB — PSA: PSA: 0.17 ng/mL (ref 0.10–4.00)

## 2020-09-03 LAB — TSH: TSH: 0.96 u[IU]/mL (ref 0.35–5.50)

## 2020-09-03 NOTE — Patient Instructions (Signed)
Health Maintenance, Male Adopting a healthy lifestyle and getting preventive care are important in promoting health and wellness. Ask your health care provider about: The right schedule for you to have regular tests and exams. Things you can do on your own to prevent diseases and keep yourself healthy. What should I know about diet, weight, and exercise? Eat a healthy diet  Eat a diet that includes plenty of vegetables, fruits, low-fat dairy products, and lean protein. Do not eat a lot of foods that are high in solid fats, added sugars, or sodium.  Maintain a healthy weight Body mass index (BMI) is a measurement that can be used to identify possible weight problems. It estimates body fat based on height and weight. Your health care provider can help determine your BMI and help you achieve or maintain ahealthy weight. Get regular exercise Get regular exercise. This is one of the most important things you can do for your health. Most adults should: Exercise for at least 150 minutes each week. The exercise should increase your heart rate and make you sweat (moderate-intensity exercise). Do strengthening exercises at least twice a week. This is in addition to the moderate-intensity exercise. Spend less time sitting. Even light physical activity can be beneficial. Watch cholesterol and blood lipids Have your blood tested for lipids and cholesterol at 65 years of age, then havethis test every 5 years. You may need to have your cholesterol levels checked more often if: Your lipid or cholesterol levels are high. You are older than 65 years of age. You are at high risk for heart disease. What should I know about cancer screening? Many types of cancers can be detected early and may often be prevented. Depending on your health history and family history, you may need to have cancer screening at various ages. This may include screening for: Colorectal cancer. Prostate cancer. Skin cancer. Lung  cancer. What should I know about heart disease, diabetes, and high blood pressure? Blood pressure and heart disease High blood pressure causes heart disease and increases the risk of stroke. This is more likely to develop in people who have high blood pressure readings, are of African descent, or are overweight. Talk with your health care provider about your target blood pressure readings. Have your blood pressure checked: Every 3-5 years if you are 18-39 years of age. Every year if you are 40 years old or older. If you are between the ages of 65 and 75 and are a current or former smoker, ask your health care provider if you should have a one-time screening for abdominal aortic aneurysm (AAA). Diabetes Have regular diabetes screenings. This checks your fasting blood sugar level. Have the screening done: Once every three years after age 45 if you are at a normal weight and have a low risk for diabetes. More often and at a younger age if you are overweight or have a high risk for diabetes. What should I know about preventing infection? Hepatitis B If you have a higher risk for hepatitis B, you should be screened for this virus. Talk with your health care provider to find out if you are at risk forhepatitis B infection. Hepatitis C Blood testing is recommended for: Everyone born from 1945 through 1965. Anyone with known risk factors for hepatitis C. Sexually transmitted infections (STIs) You should be screened each year for STIs, including gonorrhea and chlamydia, if: You are sexually active and are younger than 65 years of age. You are older than 65 years of age   and your health care provider tells you that you are at risk for this type of infection. Your sexual activity has changed since you were last screened, and you are at increased risk for chlamydia or gonorrhea. Ask your health care provider if you are at risk. Ask your health care provider about whether you are at high risk for HIV.  Your health care provider may recommend a prescription medicine to help prevent HIV infection. If you choose to take medicine to prevent HIV, you should first get tested for HIV. You should then be tested every 3 months for as long as you are taking the medicine. Follow these instructions at home: Lifestyle Do not use any products that contain nicotine or tobacco, such as cigarettes, e-cigarettes, and chewing tobacco. If you need help quitting, ask your health care provider. Do not use street drugs. Do not share needles. Ask your health care provider for help if you need support or information about quitting drugs. Alcohol use Do not drink alcohol if your health care provider tells you not to drink. If you drink alcohol: Limit how much you have to 0-2 drinks a day. Be aware of how much alcohol is in your drink. In the [place] one drink equals one 12 oz bottle of beer (355 mL), one 5 oz glass of wine (148 mL), or one 1 oz glass of hard liquor (44 mL). General instructions Schedule regular health, dental, and eye exams. Stay current with your vaccines. Tell your health care provider if: You often feel depressed. You have ever been abused or do not feel safe at home. Summary Adopting a healthy lifestyle and getting preventive care are important in promoting health and wellness. Follow your health care provider's instructions about healthy diet, exercising, and getting tested or screened for diseases. Follow your health care provider's instructions on monitoring your cholesterol and blood pressure. This information is not intended to replace advice given to you by your health care provider. Make sure you discuss any questions you have with your healthcare provider. Document Revised: 12/15/2017 Document Reviewed: 12/15/2017 Elsevier Patient Education  2022 Elsevier Inc.  

## 2020-09-03 NOTE — Progress Notes (Signed)
Subjective:  Patient ID: Juan [name], male    DOB: [birth year]  Age: 65 y.o. MRN: [id number]  CC: Annual Exam and Hypertension  This visit occurred during the SARS-CoV-2 public health emergency.  Safety protocols were in place, including screening questions prior to the visit, additional usage of staff PPE, and extensive cleaning of exam room while observing appropriate contact time as indicated for disinfecting solutions.    HPI Juan [name] presents for a CPX and f/up -  He complains of low libido and chronic fatigue.  He recently went hiking in [place] and did not experience any chest pain, shortness of breath, dyspnea on exertion, diaphoresis, palpitations, or edema.  Outpatient Medications Prior to Visit  Medication Sig Dispense Refill   aspirin 81 MG EC tablet TAKE 1 TABLET(81 MG) BY MOUTH DAILY 90 tablet 3   atorvastatin (LIPITOR) 40 MG tablet TAKE 1 TABLET(40 MG) BY MOUTH DAILY 90 tablet 3   benzonatate (TESSALON) 100 MG capsule Take 1 capsule (100 mg total) by mouth every 8 (eight) hours. 21 capsule 0   carvedilol (COREG) 3.125 MG tablet TAKE 1 TABLET(3.125 MG) BY MOUTH TWICE DAILY WITH A MEAL 180 tablet 0   CELEBREX 200 MG capsule Take 200 mg by mouth daily as needed.     clomiPHENE (CLOMID) 50 MG tablet Take 1 tablet (50 mg total) by mouth daily. 90 tablet 1   DULoxetine (CYMBALTA) 60 MG capsule TAKE 1 CAPSULE(60 MG) BY MOUTH DAILY 90 capsule 1   indapamide (LOZOL) 1.25 MG tablet Take 1 tablet (1.25 mg total) by mouth daily. 90 tablet 1   losartan (COZAAR) 100 MG tablet TAKE 1 TABLET(100 MG) BY MOUTH DAILY 90 tablet 1   shark liver oil-cocoa butter (PREPARATION H) 0.25-88.44 % suppository Place 1 suppository rectally as needed for hemorrhoids.     tadalafil (CIALIS) 5 MG tablet TAKE 1 TABLET(5 MG) BY MOUTH DAILY AS NEEDED FOR ERECTILE DYSFUNCTION 90 tablet 1   tiZANidine (ZANAFLEX) 4 MG capsule Take 1 capsule (4 mg total) by mouth 3 (three) times daily as needed for muscle  spasms. 90 capsule 1   traZODone (DESYREL) 50 MG tablet TAKE 1 TO 2 TABLETS BY MOUTH AT BEDTIME AS NEEDED 180 tablet 1   No facility-administered medications prior to visit.    ROS Review of Systems  Constitutional:  Positive for fatigue. Negative for diaphoresis.  HENT: Negative.    Eyes: Negative.   Respiratory:  Negative for cough, chest tightness, shortness of breath and wheezing.   Cardiovascular:  Negative for chest pain, palpitations and leg swelling.  Gastrointestinal:  Negative for abdominal pain, constipation, diarrhea and vomiting.  Genitourinary: Negative.  Negative for difficulty urinating, dysuria, hematuria, penile swelling, scrotal swelling and testicular pain.  Musculoskeletal: Negative.  Negative for arthralgias and myalgias.  Skin: Negative.   Neurological: Negative.   Hematological:  Negative for adenopathy. Does not bruise/bleed easily.  Psychiatric/Behavioral: Negative.     Objective:  BP 136/78 (BP Location: Right Arm, Patient Position: Sitting, Cuff Size: Large)   Pulse 90   Temp 98.1 F (36.7 C) (Oral)   Resp 16   Ht 6' (1.829 m)   Wt 182 lb (82.6 kg)   SpO2 97%   BMI 24.68 kg/m   BP Readings from Last 3 Encounters:  09/03/20 136/78  08/16/20 118/75  05/08/20 134/84    Wt Readings from Last 3 Encounters:  09/03/20 182 lb (82.6 kg)  08/16/20 180 lb (81.6 kg)  05/08/20 180 lb 2 oz (81.7  kg)    Physical Exam Vitals reviewed.  Constitutional:      Appearance: Normal appearance.  HENT:     Nose: Nose normal.     Mouth/Throat:     Mouth: Mucous membranes are moist.  Eyes:     Conjunctiva/sclera: Conjunctivae normal.  Cardiovascular:     Rate and Rhythm: Normal rate and regular rhythm.     Heart sounds: No murmur heard. Pulmonary:     Effort: Pulmonary effort is normal.     Breath sounds: No stridor. No wheezing, rhonchi or rales.  Abdominal:     General: Abdomen is flat.     Palpations: There is no mass.     Tenderness: There is no  abdominal tenderness. There is no guarding.     Hernia: No hernia is present. There is no hernia in the left inguinal area or right inguinal area.  Genitourinary:    Pubic Area: No rash.      Penis: Normal and circumcised.      Testes: Normal.     Epididymis:     Right: Normal.     Left: Normal.     Prostate: Enlarged and nodules present. Not tender.     Rectum: Guaiac result positive. Internal hemorrhoid present. No mass, tenderness, anal fissure or external hemorrhoid. Normal anal tone.  Musculoskeletal:        General: Normal range of motion.     Cervical back: Neck supple.     Right lower leg: No edema.     Left lower leg: No edema.  Lymphadenopathy:     Cervical: No cervical adenopathy.     Lower Body: No right inguinal adenopathy. No left inguinal adenopathy.  Skin:    General: Skin is warm and dry.     Coloration: Skin is not pale.  Neurological:     General: No focal deficit present.     Mental Status: He is alert.  Psychiatric:        Mood and Affect: Mood normal.        Behavior: Behavior normal.    Lab Results  Component Value Date   WBC 4.1 09/03/2020   HGB 13.3 09/03/2020   HCT 39.2 09/03/2020   PLT 268.0 09/03/2020   GLUCOSE 90 09/03/2020   CHOL 135 09/03/2020   TRIG 105.0 09/03/2020   HDL 51.00 09/03/2020   LDLDIRECT 98.0 02/28/2018   LDLCALC 63 09/03/2020   ALT 18 09/03/2020   AST 18 09/03/2020   NA 136 09/03/2020   K 4.2 09/03/2020   CL 102 09/03/2020   CREATININE 0.94 09/03/2020   BUN 24 (H) 09/03/2020   CO2 27 09/03/2020   TSH 0.96 09/03/2020   PSA 0.17 09/03/2020   INR 1.0 09/12/2019   HGBA1C 5.3 09/12/2019    No results found.  Assessment & Plan:   Juan [name] was seen today for annual exam and hypertension.  Diagnoses and all orders for this visit:  Essential hypertension- His BP is well controlled. -     CBC with Differential/Platelet; Future -     Basic metabolic panel; Future -     TSH; Future -     TSH -     Basic metabolic  panel -     CBC with Differential/Platelet  Hyperlipidemia LDL goal <130- LDL goal achieved. Doing well on the statin  -     Lipid panel; Future -     Hepatic function panel; Future -     Hepatic function panel -  Lipid panel  HYPERTRIGLYCERIDEMIA- His trigs are normal now.  Routine general medical examination at a health care facility- Exam completed, labs reviewed, vaccines reviewed and updated, cancer screenings are up-to-date, patient education was given.  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  His labs are negative for secondary causes.  His testosterone level is normal. -     Testosterone Total,Free,Bio, Males; Future -     Prolactin; Future -     Prolactin -     Testosterone Total,Free,Bio, Males  Erectile dysfunction of organic origin -     PSA; Future -     Testosterone Total,Free,Bio, Males; Future -     Prolactin; Future -     Prolactin -     Testosterone Total,Free,Bio, Males -     PSA  Flu vaccine need -     Flu Vaccine QUAD High Dose(Fluad)  I am having Juan [name] maintain his CeleBREX, tiZANidine, tadalafil, DULoxetine, clomiPHENE, benzonatate, indapamide, Preparation H, losartan, atorvastatin, aspirin, carvedilol, and traZODone.  No orders of the defined types were placed in this encounter.    Follow-up: Return in about 6 months (around 03/04/2021).  Scarlette Calico, MD

## 2020-09-04 ENCOUNTER — Encounter: Payer: Self-pay | Admitting: Internal Medicine

## 2020-09-04 LAB — TESTOSTERONE TOTAL,FREE,BIO, MALES
Albumin: 4.1 g/dL (ref 3.6–5.1)
Sex Hormone Binding: 69 nmol/L (ref 22–77)
Testosterone, Bioavailable: 78 ng/dL — ABNORMAL LOW (ref 110.0–575.0)
Testosterone, Free: 41.4 pg/mL — ABNORMAL LOW (ref 46.0–224.0)
Testosterone: 578 ng/dL (ref 250–827)

## 2020-09-04 LAB — PROLACTIN: Prolactin: 4.3 ng/mL (ref 2.0–18.0)

## 2020-09-05 ENCOUNTER — Other Ambulatory Visit: Payer: Self-pay | Admitting: Internal Medicine

## 2020-09-05 DIAGNOSIS — E291 Testicular hypofunction: Secondary | ICD-10-CM

## 2020-09-23 ENCOUNTER — Telehealth: Payer: Self-pay | Admitting: Internal Medicine

## 2020-09-23 NOTE — Telephone Encounter (Signed)
Patient is interested in scheduling a hemorrhoid banding.  I reviewed the procedure with the patient and he would like to proceed. He has been scheduled for 10/23/20 3:10

## 2020-09-23 NOTE — Telephone Encounter (Signed)
Inbound call from patient. States after his procedure 8/12, he was told about internal hemorrhoids and another procedure to help with them. Patient would like to know the procedure and more information on it before scheduling. Best contact [phone]

## 2020-10-04 DIAGNOSIS — Z96651 Presence of right artificial knee joint: Secondary | ICD-10-CM | POA: Diagnosis not present

## 2020-10-05 HISTORY — PX: REPLACEMENT TOTAL KNEE: SUR1224

## 2020-10-18 ENCOUNTER — Other Ambulatory Visit: Payer: Self-pay | Admitting: Internal Medicine

## 2020-10-18 DIAGNOSIS — E291 Testicular hypofunction: Secondary | ICD-10-CM

## 2020-10-23 ENCOUNTER — Ambulatory Visit: Payer: Medicare Other | Admitting: Internal Medicine

## 2020-10-23 ENCOUNTER — Encounter: Payer: Self-pay | Admitting: Internal Medicine

## 2020-10-23 VITALS — BP 112/64 | HR 70 | Ht 72.0 in | Wt 183.0 lb

## 2020-10-23 DIAGNOSIS — K641 Second degree hemorrhoids: Secondary | ICD-10-CM | POA: Diagnosis not present

## 2020-10-23 NOTE — Progress Notes (Signed)
  Hemorrhoid ligation procedure:  Sx rectal bleeding and discomfort Colonoscopy August 16, 2020 demonstrating internal hemorrhoids  Rectal : Normal anoderm, nontender and no mass  Anoscopy Gr 2 prolapsd int hemorhoids RP and LL and RA   PROCEDURE NOTE: The patient presents with symptomatic grade 2  hemorrhoids, requesting rubber band ligation of his/her hemorrhoidal disease.  All risks, benefits and alternative forms of therapy were described and informed consent was obtained.   The anorectum was pre-medicated with 0.125% nitroglycerin and 5% lidocaine The decision was made to band all 3 internal hemorrhoid columns, and the Buchanan was used to perform band ligation without complication.  Digital anorectal examination was then performed to assure proper positioning of the band, and to adjust the banded tissue as required.  The patient was discharged home without pain or other issues.  Dietary and behavioral recommendations were given and along with follow-up instructions.     The following adjunctive treatments were recommended:  Benefiber 1 tablespoon daily  The patient will return in 2 months for  follow-up and possible additional banding as required. No complications were encountered and the patient tolerated the procedure well.

## 2020-10-23 NOTE — Patient Instructions (Addendum)
HEMORRHOID BANDING PROCEDURE    FOLLOW-UP CARE   The procedure you have had should have been relatively painless since the banding of the area involved does not have nerve endings and there is no pain sensation.  The rubber band cuts off the blood supply to the hemorrhoid and the band may fall off as soon as 48 hours after the banding (the band may occasionally be seen in the toilet bowl following a bowel movement). You may notice a temporary feeling of fullness in the rectum which should respond adequately to plain Tylenol or Motrin.  Following the banding, avoid strenuous exercise that evening and resume full activity the next day.  A sitz bath (soaking in a warm tub) or bidet is soothing, and can be useful for cleansing the area after bowel movements.     To avoid constipation, take one tablespoons of natural wheat bran, natural oat bran, flax, Benefiber or any over the counter fiber supplement and increase your water intake to 7-8 glasses daily.    Unless you have been prescribed anorectal medication, do not put anything inside your rectum for two weeks: No suppositories, enemas, fingers, etc.  Occasionally, you may have more bleeding than usual after the banding procedure.  This is often from the untreated hemorrhoids rather than the treated one.  Don't be concerned if there is a tablespoon or so of blood.  If there is more blood than this, lie flat with your bottom higher than your head and apply an ice pack to the area. If the bleeding does not stop within a half an hour or if you feel faint, call our office at [phone] or go to the emergency room.  Problems are not common; however, if there is a substantial amount of bleeding, severe pain, chills, fever or difficulty passing urine (very rare) or other problems, you should call us at [phone] or report to the nearest emergency room.  Do not stay seated continuously for more than 2-3 hours for a day or two after the procedure.   Tighten your buttock muscles 10-15 times every two hours and take 10-15 deep breaths every 1-2 hours.  Do not spend more than a few minutes on the toilet if you cannot empty your bowel; instead re-visit the toilet at a later time.   I appreciate the opportunity to care for you. Silvano Rusk, MD,FACG

## 2020-11-04 ENCOUNTER — Other Ambulatory Visit: Payer: Self-pay | Admitting: Internal Medicine

## 2020-11-04 DIAGNOSIS — I10 Essential (primary) hypertension: Secondary | ICD-10-CM

## 2020-11-12 ENCOUNTER — Other Ambulatory Visit: Payer: Self-pay | Admitting: Internal Medicine

## 2020-11-12 DIAGNOSIS — I10 Essential (primary) hypertension: Secondary | ICD-10-CM

## 2020-11-21 ENCOUNTER — Telehealth: Payer: Self-pay | Admitting: Internal Medicine

## 2020-11-21 NOTE — Telephone Encounter (Signed)
Patient calling in  Patient says pharmacy is having trouble getting in rx clomiPHENE (CLOMID) 50 MG tablet  Says they partially filled 20 days okay but has not been able to get it in since... it is currently on back order  Patient wants to know if theres an alternative med he can take until this issue has been resolved

## 2020-11-26 ENCOUNTER — Other Ambulatory Visit: Payer: Self-pay | Admitting: Internal Medicine

## 2020-11-26 DIAGNOSIS — E291 Testicular hypofunction: Secondary | ICD-10-CM

## 2020-11-26 MED ORDER — XYOSTED 75 MG/0.5ML ~~LOC~~ SOAJ: 1.0000 | SUBCUTANEOUS | 0 refills | Status: DC

## 2020-11-26 NOTE — Telephone Encounter (Signed)
Spoke with Claiborne Billings from pts pharmacy and she has stated the generic brand clomiPHENE (CLOMID) 50 MG tablet is on back order and has been for a while, there is no date of when they will be getting this medication in at this time.  Claiborne Billings has checked to see if the pts insurance will cover the Brand name as it seems they have a shipment coming in and it does not.

## 2020-11-27 NOTE — Telephone Encounter (Signed)
Spoke with pt and was able to inform him of the medication change. Pt states he understood and has no questions or concerns at this time.

## 2020-12-12 ENCOUNTER — Encounter: Payer: Self-pay | Admitting: Internal Medicine

## 2020-12-12 NOTE — Telephone Encounter (Signed)
Ronni calling on behalf of [facility] is requesting notes faxed from patient's last office visit  Fax [phone] Phone [phone]

## 2020-12-13 ENCOUNTER — Other Ambulatory Visit: Payer: Self-pay | Admitting: Internal Medicine

## 2020-12-13 DIAGNOSIS — I10 Essential (primary) hypertension: Secondary | ICD-10-CM

## 2020-12-16 NOTE — Telephone Encounter (Signed)
09/03/20 OV has been printed and faxed

## 2020-12-25 ENCOUNTER — Ambulatory Visit: Payer: Medicare Other | Admitting: Internal Medicine

## 2020-12-26 ENCOUNTER — Other Ambulatory Visit: Payer: Self-pay | Admitting: Internal Medicine

## 2020-12-26 DIAGNOSIS — E291 Testicular hypofunction: Secondary | ICD-10-CM

## 2020-12-26 MED ORDER — TESTOSTERONE CYPIONATE 200 MG/ML IM SOLN
100.0000 mg | INTRAMUSCULAR | 0 refills | Status: DC
Start: 2020-12-26 — End: 2021-01-13

## 2021-01-13 ENCOUNTER — Other Ambulatory Visit: Payer: Self-pay | Admitting: Internal Medicine

## 2021-01-13 DIAGNOSIS — E291 Testicular hypofunction: Secondary | ICD-10-CM

## 2021-01-13 MED ORDER — XYOSTED 75 MG/0.5ML ~~LOC~~ SOAJ: 1.0000 | SUBCUTANEOUS | 0 refills | Status: DC

## 2021-02-05 ENCOUNTER — Other Ambulatory Visit: Payer: Self-pay | Admitting: Internal Medicine

## 2021-02-05 DIAGNOSIS — F409 Phobic anxiety disorder, unspecified: Secondary | ICD-10-CM

## 2021-02-19 ENCOUNTER — Other Ambulatory Visit: Payer: Self-pay | Admitting: Internal Medicine

## 2021-02-19 ENCOUNTER — Other Ambulatory Visit (HOSPITAL_COMMUNITY): Payer: Self-pay

## 2021-02-19 DIAGNOSIS — E291 Testicular hypofunction: Secondary | ICD-10-CM

## 2021-02-19 MED ORDER — XYOSTED 75 MG/0.5ML ~~LOC~~ SOAJ
1.0000 | SUBCUTANEOUS | 0 refills | Status: DC
  Filled 2021-02-19: qty 2, 28d supply, fill #0

## 2021-02-20 ENCOUNTER — Other Ambulatory Visit: Payer: Self-pay | Admitting: Internal Medicine

## 2021-02-20 DIAGNOSIS — E291 Testicular hypofunction: Secondary | ICD-10-CM

## 2021-02-24 ENCOUNTER — Other Ambulatory Visit: Payer: Self-pay | Admitting: Internal Medicine

## 2021-02-24 DIAGNOSIS — I10 Essential (primary) hypertension: Secondary | ICD-10-CM

## 2021-05-11 ENCOUNTER — Other Ambulatory Visit: Payer: Self-pay | Admitting: Internal Medicine

## 2021-05-11 DIAGNOSIS — I10 Essential (primary) hypertension: Secondary | ICD-10-CM

## 2021-05-12 ENCOUNTER — Ambulatory Visit (INDEPENDENT_AMBULATORY_CARE_PROVIDER_SITE_OTHER): Payer: Medicare Other | Admitting: Internal Medicine

## 2021-05-12 ENCOUNTER — Encounter: Payer: Self-pay | Admitting: Internal Medicine

## 2021-05-12 VITALS — BP 130/78 | HR 62 | Temp 97.8°F | Resp 16 | Ht 72.0 in | Wt 195.0 lb

## 2021-05-12 DIAGNOSIS — I10 Essential (primary) hypertension: Secondary | ICD-10-CM

## 2021-05-12 DIAGNOSIS — E291 Testicular hypofunction: Secondary | ICD-10-CM | POA: Diagnosis not present

## 2021-05-12 DIAGNOSIS — E785 Hyperlipidemia, unspecified: Secondary | ICD-10-CM | POA: Diagnosis not present

## 2021-05-12 DIAGNOSIS — L989 Disorder of the skin and subcutaneous tissue, unspecified: Secondary | ICD-10-CM | POA: Insufficient documentation

## 2021-05-12 DIAGNOSIS — Z23 Encounter for immunization: Secondary | ICD-10-CM

## 2021-05-12 LAB — LIPID PANEL
Cholesterol: 119 mg/dL (ref 0–200)
HDL: 39.1 mg/dL (ref >39.00)
LDL Cholesterol: 51 mg/dL (ref 0–99)
NonHDL: 80.37
Total CHOL/HDL Ratio: 3
Triglycerides: 145 mg/dL (ref 0.0–149.0)
VLDL: 29 mg/dL (ref 0.0–40.0)

## 2021-05-12 LAB — CBC WITH DIFFERENTIAL/PLATELET
Basophils Absolute: 0.1 10*3/uL (ref 0.0–0.1)
Basophils Relative: 1.1 % (ref 0.0–3.0)
Eosinophils Absolute: 0.6 10*3/uL (ref 0.0–0.7)
Eosinophils Relative: 11.3 % — ABNORMAL HIGH (ref 0.0–5.0)
HCT: 41.5 % (ref 39.0–52.0)
Hemoglobin: 14.2 g/dL (ref 13.0–17.0)
Lymphocytes Relative: 21.9 % (ref 12.0–46.0)
Lymphs Abs: 1.2 10*3/uL (ref 0.7–4.0)
MCHC: 34.2 g/dL (ref 30.0–36.0)
MCV: 87 fl (ref 78.0–100.0)
Monocytes Absolute: 0.5 10*3/uL (ref 0.1–1.0)
Monocytes Relative: 9.6 % (ref 3.0–12.0)
Neutro Abs: 3.1 10*3/uL (ref 1.4–7.7)
Neutrophils Relative %: 56.1 % (ref 43.0–77.0)
Platelets: 296 10*3/uL (ref 150.0–400.0)
RBC: 4.77 Mil/uL (ref 4.22–5.81)
RDW: 13.5 % (ref 11.5–15.5)
WBC: 5.6 10*3/uL (ref 4.0–10.5)

## 2021-05-12 LAB — BASIC METABOLIC PANEL
BUN: 22 mg/dL (ref 6–23)
CO2: 28 mEq/L (ref 19–32)
Calcium: 9.2 mg/dL (ref 8.4–10.5)
Chloride: 100 mEq/L (ref 96–112)
Creatinine, Ser: 0.93 mg/dL (ref 0.40–1.50)
GFR: 85.85 mL/min (ref >60.00)
Glucose, Bld: 116 mg/dL — ABNORMAL HIGH (ref 70–99)
Potassium: 3.5 mEq/L (ref 3.5–5.1)
Sodium: 137 mEq/L (ref 135–145)

## 2021-05-12 MED ORDER — CARVEDILOL 3.125 MG PO TABS: 3.1250 mg | ORAL_TABLET | Freq: Two times a day (BID) | ORAL | 1 refills | Status: DC

## 2021-05-12 MED ORDER — XYOSTED 75 MG/0.5ML ~~LOC~~ SOAJ: 1.0000 | SUBCUTANEOUS | 0 refills | Status: DC

## 2021-05-12 MED ORDER — LOSARTAN POTASSIUM 100 MG PO TABS: ORAL_TABLET | ORAL | 1 refills | Status: DC

## 2021-05-12 MED ORDER — INDAPAMIDE 1.25 MG PO TABS: ORAL_TABLET | ORAL | 1 refills | Status: DC

## 2021-05-12 NOTE — Progress Notes (Signed)
? ? ? ? ? ? ? ? ?Subjective:  ?Patient ID: Juan [name], male    DOB: 23-May-1955  Age: 66 y.o. MRN: [id number] ? ?CC: Hypertension and Hyperlipidemia ? ? ?HPI ?Juan [name] presents for f/up - ? ?He tells me his blood pressure has been well controlled.  He is active and denies headache, blurred vision, chest pain, shortness of breath, diaphoresis, dizziness, lightheadedness, or edema. ? ?Outpatient Medications Prior to Visit  ?Medication Sig Dispense Refill  ? aspirin 81 MG EC tablet TAKE 1 TABLET(81 MG) BY MOUTH DAILY 90 tablet 3  ? atorvastatin (LIPITOR) 40 MG tablet TAKE 1 TABLET(40 MG) BY MOUTH DAILY 90 tablet 3  ? tadalafil (CIALIS) 5 MG tablet TAKE 1 TABLET(5 MG) BY MOUTH DAILY AS NEEDED FOR ERECTILE DYSFUNCTION 90 tablet 1  ? tiZANidine (ZANAFLEX) 4 MG capsule Take 1 capsule (4 mg total) by mouth 3 (three) times daily as needed for muscle spasms. 90 capsule 1  ? traZODone (DESYREL) 50 MG tablet TAKE 1 TO 2 TABLETS BY MOUTH AT BEDTIME AS NEEDED 180 tablet 1  ? carvedilol (COREG) 3.125 MG tablet TAKE 1 TABLET(3.125 MG) BY MOUTH TWICE DAILY WITH A MEAL 180 tablet 0  ? CELEBREX 200 MG capsule Take 200 mg by mouth daily as needed.    ? DULoxetine (CYMBALTA) 60 MG capsule TAKE 1 CAPSULE(60 MG) BY MOUTH DAILY 90 capsule 1  ? indapamide (LOZOL) 1.25 MG tablet TAKE 1 TABLET(1.25 MG) BY MOUTH DAILY 90 tablet 1  ? losartan (COZAAR) 100 MG tablet TAKE 1 TABLET(100 MG) BY MOUTH DAILY 90 tablet 0  ? Testosterone Enanthate (XYOSTED) 75 MG/0.5ML SOAJ Inject 1 Act into the skin once a week. 5.88 mL 0  ? ?No facility-administered medications prior to visit.  ? ? ?ROS ?Review of Systems  ?Constitutional:  Negative for appetite change, chills, diaphoresis, fatigue and fever.  ?HENT: Negative.    ?Eyes: Negative.   ?Respiratory:  Negative for cough, chest tightness, shortness of breath and wheezing.   ?Cardiovascular:  Negative for chest pain, palpitations and leg swelling.  ?Gastrointestinal:  Negative for abdominal pain,  constipation, diarrhea, nausea and vomiting.  ?Genitourinary: Negative.  Negative for difficulty urinating.  ?Musculoskeletal: Negative.  Negative for arthralgias and myalgias.  ?Skin: Negative.  Negative for color change.  ?     Skin lesions for over a year  ?Neurological:  Negative for dizziness, weakness and headaches.  ?Hematological:  Negative for adenopathy. Does not bruise/bleed easily.  ?Psychiatric/Behavioral: Negative.    ? ?Objective:  ?BP 130/78 (BP Location: Right Arm, Patient Position: Sitting, Cuff Size: Large)   Pulse 62   Temp 97.8 ?F (36.6 ?C) (Oral)   Resp 16   Ht 6' (1.829 m)   Wt 195 lb (88.5 kg)   SpO2 94%   BMI 26.45 kg/m?  ? ?BP Readings from Last 3 Encounters:  ?05/12/21 130/78  ?10/23/20 112/64  ?09/03/20 136/78  ? ? ?Wt Readings from Last 3 Encounters:  ?05/12/21 195 lb (88.5 kg)  ?10/23/20 183 lb (83 kg)  ?09/03/20 182 lb (82.6 kg)  ? ? ?Physical Exam ?Vitals reviewed.  ?Constitutional:   ?   Appearance: Normal appearance.  ?HENT:  ?   Nose: Nose normal.  ?   Mouth/Throat:  ?   Mouth: Mucous membranes are moist.  ?Eyes:  ?   General: No scleral icterus. ?   Conjunctiva/sclera: Conjunctivae normal.  ?Cardiovascular:  ?   Rate and Rhythm: Normal rate and regular rhythm.  ?  Heart sounds: No murmur heard. ?  No friction rub.  ?Pulmonary:  ?   Effort: Pulmonary effort is normal.  ?   Breath sounds: No stridor. No wheezing, rhonchi or rales.  ?Abdominal:  ?   General: Abdomen is flat.  ?   Palpations: There is no mass.  ?   Tenderness: There is no abdominal tenderness. There is no guarding.  ?   Hernia: No hernia is present.  ?Musculoskeletal:     ?   General: Normal range of motion.  ?   Cervical back: Neck supple.  ?   Right lower leg: No edema.  ?   Left lower leg: No edema.  ?Lymphadenopathy:  ?   Cervical: No cervical adenopathy.  ?Skin: ?   General: Skin is warm and dry.  ?   Findings: Lesion present.  ?Neurological:  ?   General: No focal deficit present.  ?   Mental Status: He  is alert.  ? ? ?Lab Results  ?Component Value Date  ? WBC 5.6 05/12/2021  ? HGB 14.2 05/12/2021  ? HCT 41.5 05/12/2021  ? PLT 296.0 05/12/2021  ? GLUCOSE 116 (H) 05/12/2021  ? CHOL 119 05/12/2021  ? TRIG 145.0 05/12/2021  ? HDL 39.10 05/12/2021  ? LDLDIRECT 98.0 02/28/2018  ? LDLCALC 51 05/12/2021  ? ALT 18 09/03/2020  ? AST 18 09/03/2020  ? NA 137 05/12/2021  ? K 3.5 05/12/2021  ? CL 100 05/12/2021  ? CREATININE 0.93 05/12/2021  ? BUN 22 05/12/2021  ? CO2 28 05/12/2021  ? TSH 0.96 09/03/2020  ? PSA 0.17 09/03/2020  ? INR 1.0 09/12/2019  ? HGBA1C 5.3 09/12/2019  ? ? ?No results found. ? ?Assessment & Plan:  ? ?Juan [name] was seen today for hypertension and hyperlipidemia. ? ?Diagnoses and all orders for this visit: ? ?Essential hypertension- His blood pressure is well controlled. ?-     Basic metabolic panel; Future ?-     Basic metabolic panel ?-     indapamide (LOZOL) 1.25 MG tablet; TAKE 1 TABLET(1.25 MG) BY MOUTH DAILY ?-     carvedilol (COREG) 3.125 MG tablet; Take 1 tablet (3.125 mg total) by mouth 2 (two) times daily with a meal. ?-     losartan (COZAAR) 100 MG tablet; TAKE 1 TABLET(100 MG) BY MOUTH DAILY ? ?Hypogonadism male ?-     Testosterone Enanthate (XYOSTED) 75 MG/0.5ML SOAJ; Inject 1 Act into the skin once a week. ?-     Basic metabolic panel; Future ?-     CBC with Differential/Platelet; Future ?-     CBC with Differential/Platelet ?-     Basic metabolic panel ? ?Hyperlipidemia LDL goal <130- LDL goal achieved. Doing well on the statin  ?-     Lipid panel; Future ?-     Lipid panel ? ?Need for vaccination ?-     Pneumococcal conjugate vaccine 20-valent ? ?Skin lesion of face ?-     Ambulatory referral to Dermatology ? ? ?I have discontinued Juan [name]'s CeleBREX and DULoxetine. I have also changed his carvedilol. Additionally, I am having him maintain his tiZANidine, tadalafil, atorvastatin, aspirin, traZODone, Xyosted, indapamide, and losartan. ? ?Meds ordered this encounter  ?Medications  ?  Testosterone Enanthate (XYOSTED) 75 MG/0.5ML SOAJ  ?  Sig: Inject 1 Act into the skin once a week.  ?  Dispense:  6 mL  ?  Refill:  0  ? indapamide (LOZOL) 1.25 MG tablet  ?  Sig: TAKE 1 TABLET(1.25 MG)  BY MOUTH DAILY  ?  Dispense:  90 tablet  ?  Refill:  1  ? carvedilol (COREG) 3.125 MG tablet  ?  Sig: Take 1 tablet (3.125 mg total) by mouth 2 (two) times daily with a meal.  ?  Dispense:  180 tablet  ?  Refill:  1  ? losartan (COZAAR) 100 MG tablet  ?  Sig: TAKE 1 TABLET(100 MG) BY MOUTH DAILY  ?  Dispense:  90 tablet  ?  Refill:  1  ? ? ? ?Follow-up: Return in about 6 months (around 11/12/2021). ? ?Scarlette Calico, MD ?

## 2021-05-12 NOTE — Patient Instructions (Signed)
Hypertension, Adult High blood pressure (hypertension) is when the force of blood pumping through the arteries is too strong. The arteries are the blood vessels that carry blood from the heart throughout the body. Hypertension forces the heart to work harder to pump blood and may cause arteries to become narrow or stiff. Untreated or uncontrolled hypertension can lead to a heart attack, heart failure, a stroke, kidney disease, and other problems. A blood pressure reading consists of a higher number over a lower number. Ideally, your blood pressure should be below 120/80. The first ("top") number is called the systolic pressure. It is a measure of the pressure in your arteries as your heart beats. The second ("bottom") number is called the diastolic pressure. It is a measure of the pressure in your arteries as the heart relaxes. What are the causes? The exact cause of this condition is not known. There are some conditions that result in high blood pressure. What increases the risk? Certain factors may make you more likely to develop high blood pressure. Some of these risk factors are under your control, including: Smoking. Not getting enough exercise or physical activity. Being overweight. Having too much fat, sugar, calories, or salt (sodium) in your diet. Drinking too much alcohol. Other risk factors include: Having a personal history of heart disease, diabetes, high cholesterol, or kidney disease. Stress. Having a family history of high blood pressure and high cholesterol. Having obstructive sleep apnea. Age. The risk increases with age. What are the signs or symptoms? High blood pressure may not cause symptoms. Very high blood pressure (hypertensive crisis) may cause: Headache. Fast or irregular heartbeats (palpitations). Shortness of breath. Nosebleed. Nausea and vomiting. Vision changes. Severe chest pain, dizziness, and seizures. How is this diagnosed? This condition is diagnosed by  measuring your blood pressure while you are seated, with your arm resting on a flat surface, your legs uncrossed, and your feet flat on the floor. The cuff of the blood pressure monitor will be placed directly against the skin of your upper arm at the level of your heart. Blood pressure should be measured at least twice using the same arm. Certain conditions can cause a difference in blood pressure between your right and left arms. If you have a high blood pressure reading during one visit or you have normal blood pressure with other risk factors, you may be asked to: Return on a different day to have your blood pressure checked again. Monitor your blood pressure at home for 1 week or longer. If you are diagnosed with hypertension, you may have other blood or imaging tests to help your health care provider understand your overall risk for other conditions. How is this treated? This condition is treated by making healthy lifestyle changes, such as eating healthy foods, exercising more, and reducing your alcohol intake. You may be referred for counseling on a healthy diet and physical activity. Your health care provider may prescribe medicine if lifestyle changes are not enough to get your blood pressure under control and if: Your systolic blood pressure is above 130. Your diastolic blood pressure is above 80. Your personal target blood pressure may vary depending on your medical conditions, your age, and other factors. Follow these instructions at home: Eating and drinking  Eat a diet that is high in fiber and potassium, and low in sodium, added sugar, and fat. An example of this eating plan is called the DASH diet. DASH stands for Dietary Approaches to Stop Hypertension. To eat this way: Eat   plenty of fresh fruits and vegetables. Try to fill one half of your plate at each meal with fruits and vegetables. Eat whole grains, such as whole-wheat pasta, brown rice, or whole-grain bread. Fill about one  fourth of your plate with whole grains. Eat or drink low-fat dairy products, such as skim milk or low-fat yogurt. Avoid fatty cuts of meat, processed or cured meats, and poultry with skin. Fill about one fourth of your plate with lean proteins, such as fish, chicken without skin, beans, eggs, or tofu. Avoid pre-made and processed foods. These tend to be higher in sodium, added sugar, and fat. Reduce your daily sodium intake. Many people with hypertension should eat less than 1,500 mg of sodium a day. Do not drink alcohol if: Your health care provider tells you not to drink. You are pregnant, may be pregnant, or are planning to become pregnant. If you drink alcohol: Limit how much you have to: 0-1 drink a day for women. 0-2 drinks a day for men. Know how much alcohol is in your drink. In the [place] one drink equals one 12 oz bottle of beer (355 mL), one 5 oz glass of wine (148 mL), or one 1 oz glass of hard liquor (44 mL). Lifestyle  Work with your health care provider to maintain a healthy body weight or to lose weight. Ask what an ideal weight is for you. Get at least 30 minutes of exercise that causes your heart to beat faster (aerobic exercise) most days of the week. Activities may include walking, swimming, or biking. Include exercise to strengthen your muscles (resistance exercise), such as Pilates or lifting weights, as part of your weekly exercise routine. Try to do these types of exercises for 30 minutes at least 3 days a week. Do not use any products that contain nicotine or tobacco. These products include cigarettes, chewing tobacco, and vaping devices, such as e-cigarettes. If you need help quitting, ask your health care provider. Monitor your blood pressure at home as told by your health care provider. Keep all follow-up visits. This is important. Medicines Take over-the-counter and prescription medicines only as told by your health care provider. Follow directions carefully. Blood  pressure medicines must be taken as prescribed. Do not skip doses of blood pressure medicine. Doing this puts you at risk for problems and can make the medicine less effective. Ask your health care provider about side effects or reactions to medicines that you should watch for. Contact a health care provider if you: Think you are having a reaction to a medicine you are taking. Have headaches that keep coming back (recurring). Feel dizzy. Have swelling in your ankles. Have trouble with your vision. Get help right away if you: Develop a severe headache or confusion. Have unusual weakness or numbness. Feel faint. Have severe pain in your chest or abdomen. Vomit repeatedly. Have trouble breathing. These symptoms may be an emergency. Get help right away. Call 911. Do not wait to see if the symptoms will go away. Do not drive yourself to the hospital. Summary Hypertension is when the force of blood pumping through your arteries is too strong. If this condition is not controlled, it may put you at risk for serious complications. Your personal target blood pressure may vary depending on your medical conditions, your age, and other factors. For most people, a normal blood pressure is less than 120/80. Hypertension is treated with lifestyle changes, medicines, or a combination of both. Lifestyle changes include losing weight, eating a healthy,   low-sodium diet, exercising more, and limiting alcohol. This information is not intended to replace advice given to you by your health care provider. Make sure you discuss any questions you have with your health care provider. Document Revised: 10/29/2020 Document Reviewed: 10/29/2020 Elsevier Patient Education  2023 [employer]  

## 2021-05-13 MED ORDER — LOSARTAN POTASSIUM 100 MG PO TABS: ORAL_TABLET | ORAL | 1 refills | Status: DC

## 2021-05-13 MED ORDER — CARVEDILOL 3.125 MG PO TABS: 3.1250 mg | ORAL_TABLET | Freq: Two times a day (BID) | ORAL | 1 refills | Status: DC

## 2021-05-13 MED ORDER — INDAPAMIDE 1.25 MG PO TABS: ORAL_TABLET | ORAL | 1 refills | Status: DC

## 2021-05-13 NOTE — Addendum Note (Signed)
Addended by: Hinda Kehr on: 05/13/2021 02:16 PM ? ? Modules accepted: Orders ? ?

## 2021-05-18 ENCOUNTER — Other Ambulatory Visit: Payer: Self-pay | Admitting: Internal Medicine

## 2021-05-18 DIAGNOSIS — I2510 Atherosclerotic heart disease of native coronary artery without angina pectoris: Secondary | ICD-10-CM

## 2021-05-18 DIAGNOSIS — I10 Essential (primary) hypertension: Secondary | ICD-10-CM

## 2021-06-05 ENCOUNTER — Ambulatory Visit: Payer: Medicare Other | Admitting: Internal Medicine

## 2021-06-07 DIAGNOSIS — C44619 Basal cell carcinoma of skin of left upper limb, including shoulder: Secondary | ICD-10-CM | POA: Diagnosis not present

## 2021-06-07 DIAGNOSIS — L918 Other hypertrophic disorders of the skin: Secondary | ICD-10-CM | POA: Diagnosis not present

## 2021-06-07 DIAGNOSIS — L821 Other seborrheic keratosis: Secondary | ICD-10-CM | POA: Diagnosis not present

## 2021-06-07 DIAGNOSIS — L570 Actinic keratosis: Secondary | ICD-10-CM | POA: Diagnosis not present

## 2021-06-09 ENCOUNTER — Other Ambulatory Visit: Payer: Self-pay | Admitting: Cardiology

## 2021-06-10 ENCOUNTER — Other Ambulatory Visit: Payer: Self-pay | Admitting: Cardiology

## 2021-06-11 ENCOUNTER — Other Ambulatory Visit: Payer: Self-pay | Admitting: Cardiology

## 2021-07-17 ENCOUNTER — Other Ambulatory Visit: Payer: Self-pay | Admitting: Internal Medicine

## 2021-07-17 DIAGNOSIS — F409 Phobic anxiety disorder, unspecified: Secondary | ICD-10-CM

## 2021-07-22 ENCOUNTER — Encounter (INDEPENDENT_AMBULATORY_CARE_PROVIDER_SITE_OTHER): Payer: Medicare Other | Admitting: Internal Medicine

## 2021-07-22 DIAGNOSIS — M5126 Other intervertebral disc displacement, lumbar region: Secondary | ICD-10-CM

## 2021-07-23 ENCOUNTER — Other Ambulatory Visit: Payer: Self-pay | Admitting: Internal Medicine

## 2021-07-23 DIAGNOSIS — M5126 Other intervertebral disc displacement, lumbar region: Secondary | ICD-10-CM

## 2021-07-23 MED ORDER — TIZANIDINE HCL 4 MG PO TABS: 4.0000 mg | ORAL_TABLET | Freq: Four times a day (QID) | ORAL | 1 refills | Status: DC | PRN

## 2021-07-23 MED ORDER — TIZANIDINE HCL 4 MG PO CAPS: 4.0000 mg | ORAL_CAPSULE | Freq: Three times a day (TID) | ORAL | 1 refills | Status: DC | PRN

## 2021-07-23 MED ORDER — METHYLPREDNISOLONE 4 MG PO TBPK: ORAL_TABLET | ORAL | 0 refills | Status: AC

## 2021-07-23 NOTE — Telephone Encounter (Signed)
Please see the MyChart message reply(ies) for my assessment and plan.  The patient gave consent for this Medical Advice Message and is aware that it may result in a bill to their insurance company as well as the possibility that this may result in a co-payment or deductible. They are an established patient, but are not seeking medical advice exclusively about a problem treated during an in person or video visit in the last 7 days. I did not recommend an in person or video visit within 7 days of my reply.  I spent a total of 10 minutes cumulative time within 7 days through MyChart messaging Canyon Willow, MD  

## 2021-07-25 ENCOUNTER — Telehealth: Payer: Self-pay

## 2021-07-25 NOTE — Telephone Encounter (Signed)
Key: [id number]

## 2021-07-25 NOTE — Telephone Encounter (Signed)
TIZANIDINE CAP '4MG'$  is approved through 01/04/2022.

## 2021-08-11 ENCOUNTER — Other Ambulatory Visit: Payer: Self-pay | Admitting: Internal Medicine

## 2021-08-11 ENCOUNTER — Other Ambulatory Visit (HOSPITAL_COMMUNITY): Payer: Self-pay

## 2021-08-11 DIAGNOSIS — E291 Testicular hypofunction: Secondary | ICD-10-CM

## 2021-08-11 MED ORDER — XYOSTED 75 MG/0.5ML ~~LOC~~ SOAJ
1.0000 | SUBCUTANEOUS | 0 refills | Status: DC
  Filled 2021-08-11: qty 6, 90d supply, fill #0

## 2021-08-11 MED ORDER — XYOSTED 75 MG/0.5ML ~~LOC~~ SOAJ: 1.0000 | SUBCUTANEOUS | 0 refills | Status: DC

## 2021-08-28 ENCOUNTER — Encounter: Payer: Self-pay | Admitting: Cardiology

## 2021-08-28 ENCOUNTER — Ambulatory Visit: Payer: Medicare Other | Admitting: Cardiology

## 2021-08-28 VITALS — BP 116/78 | HR 59 | Ht 72.0 in | Wt 197.4 lb

## 2021-08-28 DIAGNOSIS — I10 Essential (primary) hypertension: Secondary | ICD-10-CM

## 2021-08-28 DIAGNOSIS — I2510 Atherosclerotic heart disease of native coronary artery without angina pectoris: Secondary | ICD-10-CM | POA: Diagnosis not present

## 2021-08-28 DIAGNOSIS — E785 Hyperlipidemia, unspecified: Secondary | ICD-10-CM

## 2021-08-28 NOTE — Progress Notes (Signed)
Cardiology Office Note:    Date:  08/28/2021   ID:  Juan [name], DOB 02-Jan-1956, MRN [id number]  PCP:  Janith Lima, MD  Cardiologist:  None  Electrophysiologist:  None   Referring MD: Janith Lima, MD   Chief Complaint  Patient presents with   Coronary Artery Disease   History of Present Illness:    Juan [name] is a 66 y.o. male with a hx of hypertension, hyperlipidemia who presents for follow-up.  He was referred by Dr. Ronnald Ramp for evaluation of CAD, initially seen on 07/18/2019.  He underwent calcium score on 06/23/2019, which was 1716 (97th percentile).  He reports he has been having chest pain.  Occurs about once per month.  Describes as tightness in center of his chest.  Has not noted a relationship with exertion, can occur at rest.  States it is 2-3 out of 10 in intensity and can last for several hours.  For exercise he does yoga and lifts weights about once per week.  Used to go for walks but not recently he is having knee pain and is being evaluated for knee replacement.  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  Reports he has had some fatigue dyspnea with exertion.  He denies any lightheadedness, syncope, lower extremity edema.  Reports his BP has been under good control recently. 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  Quit cigarettes 18 years ago.  Father had CABG in early 28s.    Lexiscan Myoview on 07/21/2019 showed large size moderate intensity fixed inferior/inferoseptal perfusion defect with normal wall motion, suggestive of artifact or less likely scar, no ischemia, LVEF 51%.  Echocardiogram on 08/03/2019 showed normal biventricular function, mild AI.  Since last clinic visit, he reports that he is doing well.  Does report having some fatigue.  But denies any chest pain, dyspnea, lighheadedness, syncope, lower extreme edema, or palpitations.  He lifts weights once per week and does yoga 1-2 times per week.  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  Denies any exertional symptoms.  Swollen  Past Medical History:  Diagnosis Date   COVID-19 04/23/2020   Hypertension    Personal history of colonic polyps - adenoma 04/24/2008   04/2008 - diminutive adenoma 06/13/2013      Past Surgical History:  Procedure Laterality Date   APPENDECTOMY  2007   ruptured   COLONOSCOPY      Current Medications: Current Meds  Medication Sig   aspirin 81 MG EC tablet TAKE 1 TABLET(81 MG) BY MOUTH DAILY   atorvastatin (LIPITOR) 40 MG tablet Take 1 tablet (40 mg total) by mouth daily. Make appointment with cardiologist for further refills   carvedilol (COREG) 3.125 MG tablet TAKE 1 TABLET(3.125 MG) BY MOUTH TWICE DAILY WITH A MEAL   indapamide (LOZOL) 1.25 MG tablet TAKE 1 TABLET(1.25 MG) BY MOUTH DAILY   losartan (COZAAR) 100 MG tablet TAKE 1 TABLET(100 MG) BY MOUTH DAILY   tadalafil (CIALIS) 5 MG tablet TAKE 1 TABLET(5 MG) BY MOUTH DAILY AS NEEDED FOR ERECTILE DYSFUNCTION   Testosterone Enanthate (XYOSTED) 75 MG/0.5ML SOAJ Inject 1 Act into the skin once a week.   tiZANidine (ZANAFLEX) 4 MG tablet Take 1 tablet (4 mg total) by mouth every 6 (six) hours as needed for muscle spasms.   traZODone (DESYREL) 50 MG tablet TAKE 1 TO 2 TABLETS BY MOUTH AT BEDTIME AS NEEDED  Allergies:   Ace inhibitors   Social History   Socioeconomic History   Marital status: Married    Spouse name: Not on file   Number of children: Not on file   Years of education: Not on file   Highest education level: Not on file  Occupational History   Not on file  Tobacco Use   Smoking status: Former    Types: Cigarettes    Quit date: 01/06/1999    Years since quitting: 22.6   Smokeless tobacco: Never  Vaping Use   Vaping Use: Never used  Substance and Sexual Activity   Alcohol use: No   Drug use: No    Comment: 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   Social Determinants of Health   Financial Resource Strain: Not on file  Food Insecurity: Not on file  Transportation Needs: Not on file  Physical Activity: Not on file  Stress: Not on file  Social Connections: Not on file     Family History: The patient's family history includes Heart disease in his father; Prostate cancer in his father. There is no history of Colon cancer, Esophageal cancer, Pancreatic cancer, or Stomach cancer.  ROS:   Please see the history of present illness.     All other systems reviewed and are negative.  EKGs/Labs/Other Studies Reviewed:    The following studies were reviewed today:   EKG:   08/28/2021: Sinus bradycardia, first-degree AV block, rate 59, no ST abnormalities  Recent Labs: 09/03/2020: ALT 18; TSH 0.96 05/12/2021: BUN 22; Creatinine, Ser 0.93; Hemoglobin 14.2; Platelets 296.0; Potassium 3.5; Sodium 137  Recent Lipid Panel    Component Value Date/Time   CHOL 119 05/12/2021 1109   CHOL 130 09/28/2019 1006   TRIG 145.0 05/12/2021 1109   HDL 39.10 05/12/2021 1109   HDL 46 09/28/2019 1006   CHOLHDL 3 05/12/2021 1109   VLDL 29.0 05/12/2021 1109   LDLCALC 51 05/12/2021 1109   LDLCALC 61 09/28/2019 1006   LDLDIRECT 98.0 02/28/2018 1546    Physical Exam:    VS:  BP 116/78   Pulse (!) 59   Ht 6' (1.829 m)   Wt 197 lb 6.4 oz (89.5 kg)   SpO2 99%   BMI 26.77 kg/m     Wt Readings from Last 3 Encounters:  08/28/21 197 lb 6.4 oz (89.5 kg)  05/12/21 195 lb (88.5 kg)  10/23/20 183 lb (83 kg)     GEN: Well nourished, well developed in no acute distress HEENT: Normal NECK: No JVD; No carotid bruits LYMPHATICS: No lymphadenopathy CARDIAC: RRR, no murmurs, rubs, gallops RESPIRATORY:  Clear to auscultation without rales, wheezing or rhonchi  ABDOMEN: Soft, non-tender, non-distended MUSCULOSKELETAL:   No edema; No deformity  SKIN: Warm and dry NEUROLOGIC:  Alert and oriented x 3 PSYCHIATRIC:  Normal affect   ASSESSMENT:    1. CAD in native artery   2. Hyperlipidemia, unspecified hyperlipidemia type   3. Essential hypertension     PLAN:    CAD: calcium score on 06/23/2019 was 1716 (97th percentile).  He reported atypical chest pain.  Lexiscan Myoview on 07/21/2019 showed large size moderate intensity fixed inferior/inferoseptal perfusion defect with normal wall motion, suggestive of artifact or less  likely scar, no ischemia, LVEF 51%.  Echocardiogram on 08/03/2019 showed normal biventricular function, mild AI.  No symptoms to suggest angina. -Continue atorvastatin 40 mg daily -Continue aspirin 81 mg  Hyperlipidemia: Continue atorvastatin 40 mg daily.  LDL 51 on 05/12/2021  Hypertension: On carvedilol 3.25 mg twice daily, losartan 100 mg daily, and indapamide.  Appears controlled  RTC in 1 year   Medication Adjustments/Labs and Tests Ordered: Current medicines are reviewed at length with the patient today.  Concerns regarding medicines are outlined above.  Orders Placed This Encounter  Procedures   EKG 12-Lead   No orders of the defined types were placed in this encounter.   Patient Instructions  Medication Instructions:  Your physician recommends that you continue on your current medications as directed. Please refer to the Current Medication list given to you today.  *If you need a refill on your cardiac medications before your next appointment, please call your pharmacy*  Follow-Up: At [facility], you and your health needs are our priority.  As part of our continuing mission to provide you with exceptional heart care, we have created designated Provider Care Teams.  These Care Teams include your primary Cardiologist (physician) and Advanced Practice Providers (APPs -  Physician Assistants and Nurse Practitioners) who all work together to provide you with the care you need,  when you need it.  We recommend signing up for the patient portal called "MyChart".  Sign up information is provided on this After Visit Summary.  MyChart is used to connect with patients for Virtual Visits (Telemedicine).  Patients are able to view lab/test results, encounter notes, upcoming appointments, etc.  Non-urgent messages can be sent to your provider as well.   To learn more about what you can do with MyChart, go to [email or web address].    Your next appointment:   12 month(s)  The format for your next appointment:   In Person  Provider:   Dr. Gardiner Rhyme  Important Information About Sugar         Signed, Donato Heinz, MD  08/28/2021 8:42 AM    [place]

## 2021-08-28 NOTE — Patient Instructions (Signed)
Medication Instructions:  Your physician recommends that you continue on your current medications as directed. Please refer to the Current Medication list given to you today.  *If you need a refill on your cardiac medications before your next appointment, please call your pharmacy*  Follow-Up: At [facility], you and your health needs are our priority.  As part of our continuing mission to provide you with exceptional heart care, we have created designated Provider Care Teams.  These Care Teams include your primary Cardiologist (physician) and Advanced Practice Providers (APPs -  Physician Assistants and Nurse Practitioners) who all work together to provide you with the care you need, when you need it.  We recommend signing up for the patient portal called "MyChart".  Sign up information is provided on this After Visit Summary.  MyChart is used to connect with patients for Virtual Visits (Telemedicine).  Patients are able to view lab/test results, encounter notes, upcoming appointments, etc.  Non-urgent messages can be sent to your provider as well.   To learn more about what you can do with MyChart, go to [email or web address].    Your next appointment:   12 month(s)  The format for your next appointment:   In Person  Provider:   Dr. Gardiner Rhyme  Important Information About Sugar

## 2021-09-01 ENCOUNTER — Other Ambulatory Visit: Payer: Self-pay | Admitting: Internal Medicine

## 2021-09-01 DIAGNOSIS — M5126 Other intervertebral disc displacement, lumbar region: Secondary | ICD-10-CM

## 2021-09-24 ENCOUNTER — Other Ambulatory Visit: Payer: Self-pay | Admitting: Internal Medicine

## 2021-09-24 ENCOUNTER — Other Ambulatory Visit: Payer: Self-pay | Admitting: Cardiology

## 2021-09-24 ENCOUNTER — Encounter: Payer: Self-pay | Admitting: Cardiology

## 2021-09-24 DIAGNOSIS — E291 Testicular hypofunction: Secondary | ICD-10-CM

## 2021-09-24 MED ORDER — ATORVASTATIN CALCIUM 40 MG PO TABS: 40.0000 mg | ORAL_TABLET | Freq: Every day | ORAL | 3 refills | Status: DC

## 2021-09-24 MED ORDER — XYOSTED 75 MG/0.5ML ~~LOC~~ SOAJ: 1.0000 | SUBCUTANEOUS | 0 refills | Status: DC

## 2021-09-24 MED ORDER — XYOSTED 75 MG/0.5ML ~~LOC~~ SOAJ
1.0000 | SUBCUTANEOUS | 0 refills | Status: DC
Start: 2021-09-24 — End: 2021-09-24

## 2021-10-13 ENCOUNTER — Other Ambulatory Visit: Payer: Self-pay | Admitting: Internal Medicine

## 2021-10-13 DIAGNOSIS — M5126 Other intervertebral disc displacement, lumbar region: Secondary | ICD-10-CM

## 2021-10-20 ENCOUNTER — Encounter: Payer: Self-pay | Admitting: Internal Medicine

## 2021-10-23 ENCOUNTER — Encounter: Payer: Self-pay | Admitting: Internal Medicine

## 2021-10-23 ENCOUNTER — Ambulatory Visit (INDEPENDENT_AMBULATORY_CARE_PROVIDER_SITE_OTHER): Payer: Medicare Other | Admitting: Internal Medicine

## 2021-10-23 VITALS — BP 128/84 | HR 61 | Temp 97.8°F | Resp 16 | Ht 72.0 in | Wt 199.0 lb

## 2021-10-23 DIAGNOSIS — H029 Unspecified disorder of eyelid: Secondary | ICD-10-CM | POA: Diagnosis not present

## 2021-10-23 DIAGNOSIS — R739 Hyperglycemia, unspecified: Secondary | ICD-10-CM

## 2021-10-23 DIAGNOSIS — E785 Hyperlipidemia, unspecified: Secondary | ICD-10-CM

## 2021-10-23 DIAGNOSIS — Z23 Encounter for immunization: Secondary | ICD-10-CM | POA: Diagnosis not present

## 2021-10-23 DIAGNOSIS — E291 Testicular hypofunction: Secondary | ICD-10-CM

## 2021-10-23 DIAGNOSIS — I10 Essential (primary) hypertension: Secondary | ICD-10-CM | POA: Diagnosis not present

## 2021-10-23 DIAGNOSIS — H01022 Squamous blepharitis right lower eyelid: Secondary | ICD-10-CM | POA: Diagnosis not present

## 2021-10-23 DIAGNOSIS — N400 Enlarged prostate without lower urinary tract symptoms: Secondary | ICD-10-CM | POA: Diagnosis not present

## 2021-10-23 DIAGNOSIS — Z0000 Encounter for general adult medical examination without abnormal findings: Secondary | ICD-10-CM

## 2021-10-23 DIAGNOSIS — E781 Pure hyperglyceridemia: Secondary | ICD-10-CM | POA: Diagnosis not present

## 2021-10-23 LAB — URINALYSIS, ROUTINE W REFLEX MICROSCOPIC
Bilirubin Urine: NEGATIVE
Ketones, ur: NEGATIVE
Leukocytes,Ua: NEGATIVE
Nitrite: NEGATIVE
Specific Gravity, Urine: 1.015 (ref 1.000–1.030)
Total Protein, Urine: NEGATIVE
Urine Glucose: NEGATIVE
Urobilinogen, UA: 0.2 (ref 0.0–1.0)
pH: 6 (ref 5.0–8.0)

## 2021-10-23 LAB — CBC WITH DIFFERENTIAL/PLATELET
Basophils Absolute: 0.1 10*3/uL (ref 0.0–0.1)
Basophils Relative: 0.8 % (ref 0.0–3.0)
Eosinophils Absolute: 0.2 10*3/uL (ref 0.0–0.7)
Eosinophils Relative: 3.8 % (ref 0.0–5.0)
HCT: 43.3 % (ref 39.0–52.0)
Hemoglobin: 14.5 g/dL (ref 13.0–17.0)
Lymphocytes Relative: 20.5 % (ref 12.0–46.0)
Lymphs Abs: 1.3 10*3/uL (ref 0.7–4.0)
MCHC: 33.4 g/dL (ref 30.0–36.0)
MCV: 88.2 fl (ref 78.0–100.0)
Monocytes Absolute: 0.6 10*3/uL (ref 0.1–1.0)
Monocytes Relative: 10.1 % (ref 3.0–12.0)
Neutro Abs: 4.1 10*3/uL (ref 1.4–7.7)
Neutrophils Relative %: 64.8 % (ref 43.0–77.0)
Platelets: 333 10*3/uL (ref 150.0–400.0)
RBC: 4.91 Mil/uL (ref 4.22–5.81)
RDW: 13.7 % (ref 11.5–15.5)
WBC: 6.3 10*3/uL (ref 4.0–10.5)

## 2021-10-23 LAB — HEMOGLOBIN A1C: Hgb A1c MFr Bld: 5.9 % (ref 4.6–6.5)

## 2021-10-23 LAB — BASIC METABOLIC PANEL
BUN: 16 mg/dL (ref 6–23)
CO2: 29 mEq/L (ref 19–32)
Calcium: 9.4 mg/dL (ref 8.4–10.5)
Chloride: 102 mEq/L (ref 96–112)
Creatinine, Ser: 0.92 mg/dL (ref 0.40–1.50)
GFR: 86.7 mL/min (ref >60.00)
Glucose, Bld: 104 mg/dL — ABNORMAL HIGH (ref 70–99)
Potassium: 4.1 mEq/L (ref 3.5–5.1)
Sodium: 138 mEq/L (ref 135–145)

## 2021-10-23 LAB — PSA: PSA: 0.21 ng/mL (ref 0.10–4.00)

## 2021-10-23 LAB — TSH: TSH: 1.07 u[IU]/mL (ref 0.35–5.50)

## 2021-10-23 NOTE — Patient Instructions (Signed)
Health Maintenance, Male Adopting a healthy lifestyle and getting preventive care are important in promoting health and wellness. Ask your health care provider about: The right schedule for you to have regular tests and exams. Things you can do on your own to prevent diseases and keep yourself healthy. What should I know about diet, weight, and exercise? Eat a healthy diet  Eat a diet that includes plenty of vegetables, fruits, low-fat dairy products, and lean protein. Do not eat a lot of foods that are high in solid fats, added sugars, or sodium. Maintain a healthy weight Body mass index (BMI) is a measurement that can be used to identify possible weight problems. It estimates body fat based on height and weight. Your health care provider can help determine your BMI and help you achieve or maintain a healthy weight. Get regular exercise Get regular exercise. This is one of the most important things you can do for your health. Most adults should: Exercise for at least 150 minutes each week. The exercise should increase your heart rate and make you sweat (moderate-intensity exercise). Do strengthening exercises at least twice a week. This is in addition to the moderate-intensity exercise. Spend less time sitting. Even light physical activity can be beneficial. Watch cholesterol and blood lipids Have your blood tested for lipids and cholesterol at 66 years of age, then have this test every 5 years. You may need to have your cholesterol levels checked more often if: Your lipid or cholesterol levels are high. You are older than 66 years of age. You are at high risk for heart disease. What should I know about cancer screening? Many types of cancers can be detected early and may often be prevented. Depending on your health history and family history, you may need to have cancer screening at various ages. This may include screening for: Colorectal cancer. Prostate cancer. Skin cancer. Lung  cancer. What should I know about heart disease, diabetes, and high blood pressure? Blood pressure and heart disease High blood pressure causes heart disease and increases the risk of stroke. This is more likely to develop in people who have high blood pressure readings or are overweight. Talk with your health care provider about your target blood pressure readings. Have your blood pressure checked: Every 3-5 years if you are 18-39 years of age. Every year if you are 40 years old or older. If you are between the ages of 65 and 75 and are a current or former smoker, ask your health care provider if you should have a one-time screening for abdominal aortic aneurysm (AAA). Diabetes Have regular diabetes screenings. This checks your fasting blood sugar level. Have the screening done: Once every three years after age 45 if you are at a normal weight and have a low risk for diabetes. More often and at a younger age if you are overweight or have a high risk for diabetes. What should I know about preventing infection? Hepatitis B If you have a higher risk for hepatitis B, you should be screened for this virus. Talk with your health care provider to find out if you are at risk for hepatitis B infection. Hepatitis C Blood testing is recommended for: Everyone born from 1945 through 1965. Anyone with known risk factors for hepatitis C. Sexually transmitted infections (STIs) You should be screened each year for STIs, including gonorrhea and chlamydia, if: You are sexually active and are younger than 66 years of age. You are older than 66 years of age and your   health care provider tells you that you are at risk for this type of infection. Your sexual activity has changed since you were last screened, and you are at increased risk for chlamydia or gonorrhea. Ask your health care provider if you are at risk. Ask your health care provider about whether you are at high risk for HIV. Your health care provider  may recommend a prescription medicine to help prevent HIV infection. If you choose to take medicine to prevent HIV, you should first get tested for HIV. You should then be tested every 3 months for as long as you are taking the medicine. Follow these instructions at home: Alcohol use Do not drink alcohol if your health care provider tells you not to drink. If you drink alcohol: Limit how much you have to 0-2 drinks a day. Know how much alcohol is in your drink. In the [place] one drink equals one 12 oz bottle of beer (355 mL), one 5 oz glass of wine (148 mL), or one 1 oz glass of hard liquor (44 mL). Lifestyle Do not use any products that contain nicotine or tobacco. These products include cigarettes, chewing tobacco, and vaping devices, such as e-cigarettes. If you need help quitting, ask your health care provider. Do not use street drugs. Do not share needles. Ask your health care provider for help if you need support or information about quitting drugs. General instructions Schedule regular health, dental, and eye exams. Stay current with your vaccines. Tell your health care provider if: You often feel depressed. You have ever been abused or do not feel safe at home. Summary Adopting a healthy lifestyle and getting preventive care are important in promoting health and wellness. Follow your health care provider's instructions about healthy diet, exercising, and getting tested or screened for diseases. Follow your health care provider's instructions on monitoring your cholesterol and blood pressure. This information is not intended to replace advice given to you by your health care provider. Make sure you discuss any questions you have with your health care provider. Document Revised: 05/13/2020 Document Reviewed: 05/13/2020 Elsevier Patient Education  2023 Elsevier Inc.  

## 2021-10-23 NOTE — Progress Notes (Unsigned)
Subjective:  Patient ID: Juan [name], male    DOB: 1955-06-05  Age: 66 y.o. MRN: [id number]  CC: Annual Exam and Hypertension   HPI Juan [name] presents for a CPX and f/up -  He complains of a one week history of redness, swelling, itching right lower eyelid.  Outpatient Medications Prior to Visit  Medication Sig Dispense Refill   aspirin 81 MG EC tablet TAKE 1 TABLET(81 MG) BY MOUTH DAILY 90 tablet 3   atorvastatin (LIPITOR) 40 MG tablet Take 1 tablet (40 mg total) by mouth daily. 90 tablet 3   carvedilol (COREG) 3.125 MG tablet TAKE 1 TABLET(3.125 MG) BY MOUTH TWICE DAILY WITH A MEAL 180 tablet 1   indapamide (LOZOL) 1.25 MG tablet TAKE 1 TABLET(1.25 MG) BY MOUTH DAILY 90 tablet 1   losartan (COZAAR) 100 MG tablet TAKE 1 TABLET(100 MG) BY MOUTH DAILY 90 tablet 1   tadalafil (CIALIS) 5 MG tablet TAKE 1 TABLET(5 MG) BY MOUTH DAILY AS NEEDED FOR ERECTILE DYSFUNCTION 90 tablet 1   Testosterone Enanthate (XYOSTED) 75 MG/0.5ML SOAJ Inject 1 Act into the skin once a week. 6 mL 0   tiZANidine (ZANAFLEX) 4 MG tablet TAKE 1 TABLET(4 MG) BY MOUTH EVERY 6 HOURS AS NEEDED FOR MUSCLE SPASMS 90 tablet 1   traZODone (DESYREL) 50 MG tablet TAKE 1 TO 2 TABLETS BY MOUTH AT BEDTIME AS NEEDED 180 tablet 1   No facility-administered medications prior to visit.    ROS Review of Systems  Constitutional: Negative.  Negative for diaphoresis and fatigue.  HENT: Negative.    Eyes:  Positive for itching. Negative for photophobia, pain, discharge, redness and visual disturbance.  Respiratory: Negative.  Negative for cough, chest tightness, shortness of breath and wheezing.   Cardiovascular:  Negative for chest pain, palpitations and leg swelling.  Gastrointestinal:  Negative for abdominal pain, constipation, diarrhea, nausea and vomiting.  Endocrine: Negative.   Genitourinary: Negative.  Negative for penile swelling and scrotal swelling.  Musculoskeletal: Negative.   Skin: Negative.    Allergic/Immunologic: Negative.   Neurological: Negative.   Hematological:  Negative for adenopathy. Does not bruise/bleed easily.  Psychiatric/Behavioral: Negative.      Objective:  BP 128/84 (BP Location: Left Arm, Patient Position: Sitting, Cuff Size: Large)   Pulse 61   Temp 97.8 F (36.6 C) (Oral)   Resp 16   Ht 6' (1.829 m)   Wt 199 lb (90.3 kg)   SpO2 96%   BMI 26.99 kg/m   BP Readings from Last 3 Encounters:  10/23/21 128/84  08/28/21 116/78  05/12/21 130/78    Wt Readings from Last 3 Encounters:  10/23/21 199 lb (90.3 kg)  08/28/21 197 lb 6.4 oz (89.5 kg)  05/12/21 195 lb (88.5 kg)    Physical Exam Vitals reviewed.  Constitutional:      Appearance: Normal appearance.  HENT:     Nose: Nose normal.     Mouth/Throat:     Mouth: Mucous membranes are moist.  Eyes:     General: No scleral icterus.    Conjunctiva/sclera: Conjunctivae normal.  Cardiovascular:     Rate and Rhythm: Normal rate and regular rhythm.     Heart sounds: No murmur heard.    No gallop.  Pulmonary:     Effort: Pulmonary effort is normal.     Breath sounds: No stridor. No wheezing, rhonchi or rales.  Abdominal:     General: Abdomen is flat.     Palpations: There is no mass.  Tenderness: There is no abdominal tenderness. There is no guarding.     Hernia: No hernia is present. There is no hernia in the left inguinal area or right inguinal area.  Genitourinary:    Pubic Area: No rash.      Penis: Normal and circumcised.      Testes: Normal.     Epididymis:     Right: Normal.     Prostate: Enlarged. Not tender and no nodules present.     Rectum: Normal. Guaiac result negative. No mass, tenderness, anal fissure, external hemorrhoid or internal hemorrhoid. Normal anal tone.  Musculoskeletal:     Cervical back: Neck supple.  Lymphadenopathy:     Cervical: No cervical adenopathy.     Lower Body: No right inguinal adenopathy. No left inguinal adenopathy.  Neurological:     Mental  Status: He is alert.     Lab Results  Component Value Date   WBC 6.3 10/23/2021   HGB 14.5 10/23/2021   HCT 43.3 10/23/2021   PLT 333.0 10/23/2021   GLUCOSE 104 (H) 10/23/2021   CHOL 119 05/12/2021   TRIG 145.0 05/12/2021   HDL 39.10 05/12/2021   LDLDIRECT 98.0 02/28/2018   LDLCALC 51 05/12/2021   ALT 18 09/03/2020   AST 18 09/03/2020   NA 138 10/23/2021   K 4.1 10/23/2021   CL 102 10/23/2021   CREATININE 0.92 10/23/2021   BUN 16 10/23/2021   CO2 29 10/23/2021   TSH 1.07 10/23/2021   PSA 0.21 10/23/2021   INR 1.0 09/12/2019   HGBA1C 5.9 10/23/2021    No results found.  Assessment & Plan:   Juan [name] was seen today for annual exam and hypertension.  Diagnoses and all orders for this visit:  Essential hypertension -     TSH; Future -     Cancel: Basic metabolic panel; Future -     CBC with Differential/Platelet; Future -     Basic metabolic panel; Future -     Urinalysis, Routine w reflex microscopic; Future -     Urinalysis, Routine w reflex microscopic -     Basic metabolic panel -     CBC with Differential/Platelet -     TSH  Routine general medical examination at a health care facility  Hyperglyceridemia, pure  Hyperlipidemia LDL goal <130 -     TSH; Future -     TSH  Hypogonadism male -     CBC with Differential/Platelet; Future -     CBC with Differential/Platelet  Chronic hyperglycemia -     Basic metabolic panel; Future -     Hemoglobin A1c; Future -     Hemoglobin A1c -     Basic metabolic panel  Benign prostatic hyperplasia without lower urinary tract symptoms -     PSA; Future -     Urinalysis, Routine w reflex microscopic; Future -     Urinalysis, Routine w reflex microscopic -     PSA  Lesion of right eyelid -     Ambulatory referral to Plastic Surgery  Flu vaccine need -     Flu Vaccine QUAD High Dose(Fluad)   I am having Juan [name] maintain his tadalafil, indapamide, aspirin EC, carvedilol, losartan, traZODone, Xyosted,  atorvastatin, and tiZANidine.  No orders of the defined types were placed in this encounter.    Follow-up: Return in about 6 months (around 04/24/2022).  Scarlette Calico, MD

## 2021-10-25 DIAGNOSIS — Z23 Encounter for immunization: Secondary | ICD-10-CM | POA: Insufficient documentation

## 2021-10-25 DIAGNOSIS — H01022 Squamous blepharitis right lower eyelid: Secondary | ICD-10-CM | POA: Insufficient documentation

## 2021-10-25 MED ORDER — SULFACETAMIDE-PREDNISOLONE 10-0.2 % OP OINT: 1.0000 | TOPICAL_OINTMENT | Freq: Four times a day (QID) | OPHTHALMIC | 1 refills | Status: DC

## 2021-10-27 ENCOUNTER — Telehealth: Payer: Self-pay

## 2021-10-27 ENCOUNTER — Other Ambulatory Visit: Payer: Self-pay | Admitting: Internal Medicine

## 2021-10-27 NOTE — Telephone Encounter (Signed)
Please advise 

## 2021-10-27 NOTE — Telephone Encounter (Signed)
Rob is calling in from [facility], they no longer make sulfacetaminde-prednisoLONE (BLEPHAMIDE) ophthalmic ointment. Wondered if there is another prescription.

## 2021-10-28 ENCOUNTER — Ambulatory Visit: Payer: Medicare Other | Admitting: Plastic Surgery

## 2021-10-28 ENCOUNTER — Encounter: Payer: Self-pay | Admitting: Plastic Surgery

## 2021-10-28 ENCOUNTER — Other Ambulatory Visit: Payer: Self-pay | Admitting: Internal Medicine

## 2021-10-28 VITALS — BP 137/84 | HR 58 | Ht 72.0 in | Wt 197.6 lb

## 2021-10-28 DIAGNOSIS — Z85828 Personal history of other malignant neoplasm of skin: Secondary | ICD-10-CM | POA: Diagnosis not present

## 2021-10-28 DIAGNOSIS — H01022 Squamous blepharitis right lower eyelid: Secondary | ICD-10-CM

## 2021-10-28 DIAGNOSIS — D485 Neoplasm of uncertain behavior of skin: Secondary | ICD-10-CM

## 2021-10-28 DIAGNOSIS — L989 Disorder of the skin and subcutaneous tissue, unspecified: Secondary | ICD-10-CM | POA: Insufficient documentation

## 2021-10-28 MED ORDER — NEOMYCIN-POLYMYXIN-DEXAMETH 3.5-10000-0.1 OP OINT: 1.0000 | TOPICAL_OINTMENT | Freq: Three times a day (TID) | OPHTHALMIC | 1 refills | Status: DC

## 2021-10-28 NOTE — Progress Notes (Signed)
Patient ID: Juan [name], male    DOB: 02-26-1955, 66 y.o.   MRN: [id number]   Chief Complaint  Patient presents with   Advice Only   Skin Problem    The patient is a 66 year old male here for evaluation of his right lower eyelid.  He noticed a mass on the lower right eyelid 2 weeks ago.  It is a little tender and itchy.  It has gotten larger over the last week.  Its about 5 mm and has almost a red blister type of the look to it.  He has not had any biopsy or seen anyone else about this.  He is being treated for hyperlipidemia and cardiac disease.  He is on testosterone.  He is otherwise in good health.  He denies any trauma to the area.  He does wear glasses.  The lesion abuts the ciliary line.  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    Review of Systems  Constitutional: Negative.   Eyes: Negative.   Respiratory: Negative.  Negative for chest tightness.   Cardiovascular: Negative.   Gastrointestinal: Negative.   Endocrine: Negative.   Genitourinary: Negative.   Musculoskeletal: Negative.   Skin:  Positive for wound.  Hematological: Negative.   Psychiatric/Behavioral: Negative.      Past Medical History:  Diagnosis Date   COVID-19 04/23/2020   Hypertension    Personal history of colonic polyps - adenoma 04/24/2008   04/2008 - diminutive adenoma 06/13/2013      Past Surgical History:  Procedure Laterality Date   APPENDECTOMY  2007   ruptured   COLONOSCOPY        Current Outpatient Medications:    aspirin 81 MG EC tablet, TAKE 1 TABLET(81 MG) BY MOUTH DAILY, Disp: 90 tablet, Rfl: 3   atorvastatin (LIPITOR) 40 MG tablet, Take 1 tablet (40 mg total) by mouth daily., Disp: 90 tablet, Rfl: 3   carvedilol (COREG) 3.125 MG tablet, TAKE 1 TABLET(3.125 MG) BY MOUTH TWICE DAILY WITH A MEAL, Disp: 180 tablet, Rfl: 1   indapamide (LOZOL) 1.25 MG tablet, TAKE 1 TABLET(1.25 MG) BY MOUTH DAILY, Disp: 90 tablet, Rfl: 1   losartan (COZAAR) 100 MG tablet, TAKE  1 TABLET(100 MG) BY MOUTH DAILY, Disp: 90 tablet, Rfl: 1   neomycin-polymyxin b-dexamethasone (MAXITROL) 3.5-10000-0.1 OINT, Place 1 Application into the right eye 3 (three) times daily., Disp: 3.5 g, Rfl: 1   tadalafil (CIALIS) 5 MG tablet, TAKE 1 TABLET(5 MG) BY MOUTH DAILY AS NEEDED FOR ERECTILE DYSFUNCTION, Disp: 90 tablet, Rfl: 1   Testosterone Enanthate (XYOSTED) 75 MG/0.5ML SOAJ, Inject 1 Act into the skin once a week., Disp: 6 mL, Rfl: 0   tiZANidine (ZANAFLEX) 4 MG tablet, TAKE 1 TABLET(4 MG) BY MOUTH EVERY 6 HOURS AS NEEDED FOR MUSCLE SPASMS, Disp: 90 tablet, Rfl: 1   traZODone (DESYREL) 50 MG tablet, TAKE 1 TO 2 TABLETS BY MOUTH AT BEDTIME AS NEEDED, Disp: 180 tablet, Rfl: 1   Objective:   Vitals:   10/28/21 1021  BP: 137/84  Pulse: (!) 58  SpO2: 98%    Physical Exam Vitals reviewed.  Constitutional:      Appearance: Normal appearance.  HENT:     Head: Normocephalic.  Cardiovascular:     Rate and Rhythm: Normal rate.     Pulses: Normal pulses.  Pulmonary:     Effort: Pulmonary effort is normal.  Skin:    Capillary Refill: Capillary refill takes less than 2  seconds.     Coloration: Skin is not jaundiced.     Findings: Erythema and lesion present. No bruising.  Neurological:     Mental Status: He is alert and oriented to person, place, and time.  Psychiatric:        Mood and Affect: Mood normal.        Behavior: Behavior normal.        Thought Content: Thought content normal.        Judgment: Judgment normal.     Assessment & Plan:  Squamous blepharitis of right lower eyelid  Changing skin lesion  Because of his past medical history and the proximity to the ciliary line, my recommendation is for him to see Dr. Isidoro Donning.  We have made the referral if he does not hear anything back in the next 2 weeks he is to let me know.  Pictures were obtained of the patient and placed in the chart with the patient's or guardian's permission.   [place],  DO

## 2021-10-29 ENCOUNTER — Telehealth: Payer: Self-pay | Admitting: Plastic Surgery

## 2021-10-29 NOTE — Telephone Encounter (Signed)
Spoke with pt and gave him the appt set up with Dr. Linton Rump office.  First available was set for 10/30 at 9:20 in [place] office.  [place] office could not see him until December and this was an urgent referral.  Pt request me to send him address on his my chart and that was completed.

## 2021-10-30 ENCOUNTER — Other Ambulatory Visit: Payer: Self-pay | Admitting: Internal Medicine

## 2021-10-30 DIAGNOSIS — E291 Testicular hypofunction: Secondary | ICD-10-CM

## 2021-11-03 DIAGNOSIS — H57813 Brow ptosis, bilateral: Secondary | ICD-10-CM | POA: Diagnosis not present

## 2021-11-03 DIAGNOSIS — H0279 Other degenerative disorders of eyelid and periocular area: Secondary | ICD-10-CM | POA: Diagnosis not present

## 2021-11-03 DIAGNOSIS — D485 Neoplasm of uncertain behavior of skin: Secondary | ICD-10-CM | POA: Diagnosis not present

## 2021-11-03 DIAGNOSIS — H02834 Dermatochalasis of left upper eyelid: Secondary | ICD-10-CM | POA: Diagnosis not present

## 2021-11-03 DIAGNOSIS — H02831 Dermatochalasis of right upper eyelid: Secondary | ICD-10-CM | POA: Diagnosis not present

## 2021-11-07 ENCOUNTER — Other Ambulatory Visit: Payer: Self-pay | Admitting: Internal Medicine

## 2021-11-07 ENCOUNTER — Encounter: Payer: Self-pay | Admitting: Internal Medicine

## 2021-11-07 DIAGNOSIS — F409 Phobic anxiety disorder, unspecified: Secondary | ICD-10-CM

## 2021-11-12 ENCOUNTER — Ambulatory Visit: Payer: Medicare Other | Admitting: Internal Medicine

## 2021-11-13 DIAGNOSIS — H0012 Chalazion right lower eyelid: Secondary | ICD-10-CM | POA: Diagnosis not present

## 2021-11-13 DIAGNOSIS — D485 Neoplasm of uncertain behavior of skin: Secondary | ICD-10-CM | POA: Diagnosis not present

## 2021-11-18 ENCOUNTER — Other Ambulatory Visit: Payer: Self-pay | Admitting: Internal Medicine

## 2021-11-18 DIAGNOSIS — I10 Essential (primary) hypertension: Secondary | ICD-10-CM

## 2021-12-18 ENCOUNTER — Ambulatory Visit (INDEPENDENT_AMBULATORY_CARE_PROVIDER_SITE_OTHER): Payer: Medicare Other | Admitting: *Deleted

## 2021-12-18 DIAGNOSIS — Z0000 Encounter for general adult medical examination without abnormal findings: Secondary | ICD-10-CM

## 2021-12-18 NOTE — Patient Instructions (Signed)
Health Maintenance, Male Adopting a healthy lifestyle and getting preventive care are important in promoting health and wellness. Ask your health care provider about: The right schedule for you to have regular tests and exams. Things you can do on your own to prevent diseases and keep yourself healthy. What should I know about diet, weight, and exercise? Eat a healthy diet  Eat a diet that includes plenty of vegetables, fruits, low-fat dairy products, and lean protein. Do not eat a lot of foods that are high in solid fats, added sugars, or sodium. Maintain a healthy weight Body mass index (BMI) is a measurement that can be used to identify possible weight problems. It estimates body fat based on height and weight. Your health care provider can help determine your BMI and help you achieve or maintain a healthy weight. Get regular exercise Get regular exercise. This is one of the most important things you can do for your health. Most adults should: Exercise for at least 150 minutes each week. The exercise should increase your heart rate and make you sweat (moderate-intensity exercise). Do strengthening exercises at least twice a week. This is in addition to the moderate-intensity exercise. Spend less time sitting. Even light physical activity can be beneficial. Watch cholesterol and blood lipids Have your blood tested for lipids and cholesterol at 66 years of age, then have this test every 5 years. You may need to have your cholesterol levels checked more often if: Your lipid or cholesterol levels are high. You are older than 66 years of age. You are at high risk for heart disease. What should I know about cancer screening? Many types of cancers can be detected early and may often be prevented. Depending on your health history and family history, you may need to have cancer screening at various ages. This may include screening for: Colorectal cancer. Prostate cancer. Skin cancer. Lung  cancer. What should I know about heart disease, diabetes, and high blood pressure? Blood pressure and heart disease High blood pressure causes heart disease and increases the risk of stroke. This is more likely to develop in people who have high blood pressure readings or are overweight. Talk with your health care provider about your target blood pressure readings. Have your blood pressure checked: Every 3-5 years if you are 18-39 years of age. Every year if you are 40 years old or older. If you are between the ages of 65 and 75 and are a current or former smoker, ask your health care provider if you should have a one-time screening for abdominal aortic aneurysm (AAA). Diabetes Have regular diabetes screenings. This checks your fasting blood sugar level. Have the screening done: Once every three years after age 45 if you are at a normal weight and have a low risk for diabetes. More often and at a younger age if you are overweight or have a high risk for diabetes. What should I know about preventing infection? Hepatitis B If you have a higher risk for hepatitis B, you should be screened for this virus. Talk with your health care provider to find out if you are at risk for hepatitis B infection. Hepatitis C Blood testing is recommended for: Everyone born from 1945 through 1965. Anyone with known risk factors for hepatitis C. Sexually transmitted infections (STIs) You should be screened each year for STIs, including gonorrhea and chlamydia, if: You are sexually active and are younger than 66 years of age. You are older than 66 years of age and your   health care provider tells you that you are at risk for this type of infection. Your sexual activity has changed since you were last screened, and you are at increased risk for chlamydia or gonorrhea. Ask your health care provider if you are at risk. Ask your health care provider about whether you are at high risk for HIV. Your health care provider  may recommend a prescription medicine to help prevent HIV infection. If you choose to take medicine to prevent HIV, you should first get tested for HIV. You should then be tested every 3 months for as long as you are taking the medicine. Follow these instructions at home: Alcohol use Do not drink alcohol if your health care provider tells you not to drink. If you drink alcohol: Limit how much you have to 0-2 drinks a day. Know how much alcohol is in your drink. In the [place] one drink equals one 12 oz bottle of beer (355 mL), one 5 oz glass of [place] (148 mL), or one 1 oz glass of hard liquor (44 mL). Lifestyle Do not use any products that contain nicotine or tobacco. These products include cigarettes, chewing tobacco, and vaping devices, such as e-cigarettes. If you need help quitting, ask your health care provider. Do not use street drugs. Do not share needles. Ask your health care provider for help if you need support or information about quitting drugs. General instructions Schedule regular health, dental, and eye exams. Stay current with your vaccines. Tell your health care provider if: You often feel depressed. You have ever been abused or do not feel safe at home. Summary Adopting a healthy lifestyle and getting preventive care are important in promoting health and wellness. Follow your health care provider's instructions about healthy diet, exercising, and getting tested or screened for diseases. Follow your health care provider's instructions on monitoring your cholesterol and blood pressure. This information is not intended to replace advice given to you by your health care provider. Make sure you discuss any questions you have with your health care provider. Document Revised: 05/13/2020 Document Reviewed: 05/13/2020 Elsevier Patient Education  2023 Elsevier Inc.  

## 2021-12-18 NOTE — Progress Notes (Signed)
Subjective:   Juan [name] is a 66 y.o. male who presents for an Initial Medicare Annual Wellness Visit. I connected with  Juan [name] on 12/18/21 by a audio enabled telemedicine application and verified that I am speaking with the correct person using two identifiers.  Patient Location: Home  Provider Location: Home Office  I discussed the limitations of evaluation and management by telemedicine. The patient expressed understanding and agreed to proceed.  Review of Systems    Deferred to PCP       Objective:    There were no vitals filed for this visit. There is no height or weight on file to calculate BMI.     12/18/2021    3:37 PM 08/23/2019    1:46 PM 06/13/2013   11:17 AM 05/11/2013    7:55 AM  Advanced Directives  Does Patient Have a Medical Advance Directive? Yes No Patient does not have advance directive Patient does not have advance directive  Type of Advance Directive [name];Living will     Does patient want to make changes to medical advance directive? No - Patient declined     Copy of [place] in Chart? No - copy requested     Would patient like information on creating a medical advance directive?  Yes (ED - Information included in AVS)      Current Medications (verified) Outpatient Encounter Medications as of 12/18/2021  Medication Sig   aspirin 81 MG EC tablet TAKE 1 TABLET(81 MG) BY MOUTH DAILY   atorvastatin (LIPITOR) 40 MG tablet Take 1 tablet (40 mg total) by mouth daily.   carvedilol (COREG) 3.125 MG tablet TAKE 1 TABLET(3.125 MG) BY MOUTH TWICE DAILY WITH A MEAL   indapamide (LOZOL) 1.25 MG tablet TAKE 1 TABLET(1.25 MG) BY MOUTH DAILY   losartan (COZAAR) 100 MG tablet TAKE 1 TABLET(100 MG) BY MOUTH DAILY   tadalafil (CIALIS) 5 MG tablet TAKE 1 TABLET(5 MG) BY MOUTH DAILY AS NEEDED FOR ERECTILE DYSFUNCTION   tiZANidine (ZANAFLEX) 4 MG tablet TAKE 1 TABLET(4 MG) BY MOUTH EVERY 6 HOURS AS NEEDED FOR MUSCLE SPASMS    traZODone (DESYREL) 50 MG tablet TAKE 1 TO 2 TABLETS BY MOUTH AT BEDTIME AS NEEDED   XYOSTED 75 MG/0.5ML SOAJ INJECT '75MG'$  SUBCUTANEOUSLY ONCE WEEKLY AS DIRECTED   neomycin-polymyxin b-dexamethasone (MAXITROL) 3.5-10000-0.1 OINT Place 1 Application into the right eye 3 (three) times daily.   No facility-administered encounter medications on file as of 12/18/2021.    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   Family History  Problem Relation Age of Onset   Prostate cancer Father    Heart disease Father    Colon cancer Neg Hx    Esophageal cancer Neg Hx    Pancreatic cancer Neg Hx    Stomach cancer Neg Hx    Social History   Socioeconomic History   Marital status: Married    Spouse name: Not on file   Number of children: Not on file   Years of education: Not on file   Highest education level: Not on file  Occupational History   Not on  file  Tobacco Use   Smoking status: Former    Types: Cigarettes    Quit date: 01/06/1999    Years since quitting: 22.9   Smokeless tobacco: Never  Vaping Use   Vaping Use: Never used  Substance and Sexual Activity   Alcohol use: No   Drug use: No    Comment: 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   Social Determinants of Health   Financial Resource Strain: Low Risk  (12/18/2021)   Overall Financial Resource Strain (CARDIA)    Difficulty of Paying Living Expenses: Not hard at all   Food Insecurity: No Food Insecurity (12/18/2021)   Hunger Vital Sign    Worried About Running Out of Food in the Last Year: Never true    Ran Out of Food in the Last Year: Never true  Transportation Needs: No Transportation Needs (12/18/2021)   PRAPARE - THydrologist(Medical): No    Lack of Transportation (Non-Medical): No  Physical Activity: Sufficiently Active (12/18/2021)   Exercise Vital Sign    Days of Exercise per Week: 6 days    Minutes of Exercise per Session: 50 min  Stress: No Stress Concern Present (12/18/2021)   F[place]   Feeling of Stress : Not at all  Social Connections: Moderately Integrated (12/18/2021)   Social Connection and Isolation Panel [NHANES]    Frequency of Communication with Friends and Family: More than three times a week    Frequency of Social Gatherings with Friends and Family: More than three times a week    Attends Religious Services: Never    AMarine scientistor Organizations: Yes    Attends CMusic therapist More than 4 times per year    Marital Status: Married    Tobacco Counseling Counseling given: Not Answered   Clinical Intake:  Pre-visit preparation completed: Yes  Pain : No/denies pain     Nutritional Status: BMI of 19-24  Normal Nutritional Risks: None Diabetes: No  How often do you need to have someone help you when you read instructions, pamphlets, or other written materials from your doctor or pharmacy?: 1 - Never  Diabetic?No  Interpreter Needed?: No  Information entered by :: JEmelia LoronRN   Activities of Daily Living    12/18/2021    3:37 PM  In your present state of health, do you have any difficulty performing the following activities:  Hearing? 0    Patient Care Team: JJanith Lima MD as PCP - General SDonato Heinz MD as PCP - Cardiology (Cardiology)  Indicate any recent  Medical Services you may have received from other than [facility] providers in the past year (date may be approximate).     Assessment:   This is a routine wellness examination for Juan [name]  Hearing/Vision screen No results found.  Dietary issues and exercise activities discussed:     Goals Addressed             This Visit's Progress    Patient Stated       I want to lose weight by decreasing the amount of carbohydrates I eat.  Depression Screen    12/18/2021    3:35 PM 09/03/2020    9:21 AM 04/02/2020    7:58 AM 06/14/2019    8:31 AM 01/31/2019    4:09 PM 09/15/2018   10:36 AM 03/01/2018    9:17 AM  PHQ 2/9 Scores  PHQ - 2 Score 0 0 0 0 '1 2 3  '$ PHQ- 9 Score  0 0 '2 2 5 5    '$ Fall Risk    12/18/2021    3:38 PM 10/23/2021    9:21 AM 09/03/2020    9:20 AM 09/15/2018   10:36 AM 03/01/2018    9:17 AM  Fall Risk   Falls in the past year? 0 0 0 0 0  Number falls in past yr: 0 0  0 0  Injury with Fall? 0 0  0 0  Risk for fall due to : No Fall Risks No Fall Risks     Follow up Falls evaluation completed Falls evaluation completed  Falls evaluation completed Falls evaluation completed    [place]:  Any stairs in or around the home? Yes  If so, are there any without handrails? No  Home free of loose throw rugs in walkways, pet beds, electrical cords, etc? Yes  Adequate lighting in your home to reduce risk of falls? Yes   ASSISTIVE DEVICES UTILIZED TO PREVENT FALLS:  Life alert? No  Use of a cane, walker or w/c? No  Grab bars in the bathroom? No  Shower chair or bench in shower? No  Elevated toilet seat or a handicapped toilet? No   Cognitive Function:        12/18/2021    3:38 PM  6CIT Screen  What Year? 0 points  What month? 0 points  What time? 0 points  Count back from 20 0 points  Months in reverse 0 points  Repeat phrase 0 points  Total Score 0 points    Immunizations Immunization History  Administered Date(s)  Administered   Fluad Quad(high Dose 65+) 09/03/2020, 10/23/2021   Influenza Whole 01/13/2010   Influenza,inj,Quad PF,6+ Mos 10/09/2015, 12/21/2016, 02/28/2018, 09/15/2018, 09/12/2019   Influenza-Unspecified 09/21/2012, 03/06/2014, 09/21/2014   PFIZER(Purple Top)SARS-COV-2 Vaccination 03/22/2019, 04/12/2019   PNEUMOCOCCAL CONJUGATE-20 05/12/2021   Td 04/25/2009   Tdap 06/14/2019   Zoster Recombinat (Shingrix) 12/25/2016, 06/14/2019, 11/15/2019    TDAP status: Up to date  Flu Vaccine status: Up to date  Pneumococcal vaccine status: Up to date  Covid-19 vaccine status: Information provided on how to obtain vaccines.   Qualifies for Shingles Vaccine? Yes   Zostavax completed No   Shingrix Completed?: Yes  Screening Tests Health Maintenance  Topic Date Due   COVID-19 Vaccine (3 - Pfizer risk series) 05/10/2019   Medicare Annual Wellness (AWV)  12/19/2022   DTaP/Tdap/Td (3 - Td or Tdap) 06/13/2029   COLONOSCOPY (Pts 45-69yr Insurance coverage will need to be confirmed)  08/17/2030   Pneumonia Vaccine 66 Years old  Completed   INFLUENZA VACCINE  Completed   Hepatitis C Screening  Completed   Zoster Vaccines- Shingrix  Completed   HPV VACCINES  Aged Out    Health Maintenance  Health Maintenance Due  Topic Date Due   COVID-19 Vaccine (3 - Pfizer risk series) 05/10/2019    Colorectal cancer screening: Type of screening: Colonoscopy. Completed 08/16/20. Repeat every 10 years  Lung Cancer Screening: (Low Dose CT Chest recommended if Age 66-80years, 30 pack-year currently smoking OR have quit w/in 15years.)  does not qualify.   Additional Screening:  Hepatitis C Screening: does qualify; Completed 10/21/15  Vision Screening: Recommended annual ophthalmology exams for early detection of glaucoma and other disorders of the eye. Is the patient up to date with their annual eye exam?  Yes  Who is the provider or what is the name of the office in which the patient attends annual  eye exams? Dr. If pt is not established with a provider, would they like to be referred to a provider to establish care?  N/A .   Dental Screening: Recommended annual dental exams for proper oral hygiene  Community Resource Referral / Chronic Care Management: CRR required this visit?  No   CCM required this visit?  No      Plan:     I have personally reviewed and noted the following in the patient's chart:   Medical and social history Use of alcohol, tobacco or illicit drugs  Current medications and supplements including opioid prescriptions. Patient is not currently taking opioid prescriptions. Functional ability and status Nutritional status Physical activity Advanced directives List of other physicians Hospitalizations, surgeries, and ER visits in previous 12 months Vitals Screenings to include cognitive, depression, and falls Referrals and appointments  In addition, I have reviewed and discussed with patient certain preventive protocols, quality metrics, and best practice recommendations. A written personalized care plan for preventive services as well as general preventive health recommendations were provided to patient.     Michiel Cowboy, RN   12/18/2021   Nurse Notes:  Juan [name] , Thank you for taking time to come for your Medicare Wellness Visit. I appreciate your ongoing commitment to your health goals. Please review the following plan we discussed and let me know if I can assist you in the future.   These are the goals we discussed:  Goals      Patient Stated     I want to lose weight by decreasing the amount of carbohydrates I eat.         This is a list of the screening recommended for you and due dates:  Health Maintenance  Topic Date Due   COVID-19 Vaccine (3 - Pfizer risk series) 05/10/2019   Medicare Annual Wellness Visit  12/19/2022   DTaP/Tdap/Td vaccine (3 - Td or Tdap) 06/13/2029   Colon Cancer Screening  08/17/2030   Pneumonia Vaccine   Completed   Flu Shot  Completed   Hepatitis C Screening: USPSTF Recommendation to screen - Ages 18-79 yo.  Completed   Zoster (Shingles) Vaccine  Completed   HPV Vaccine  Aged Out

## 2022-01-23 ENCOUNTER — Telehealth: Payer: Medicare Other | Admitting: Physician Assistant

## 2022-01-23 DIAGNOSIS — J069 Acute upper respiratory infection, unspecified: Secondary | ICD-10-CM

## 2022-01-23 MED ORDER — FLUTICASONE PROPIONATE 50 MCG/ACT NA SUSP: 2.0000 | Freq: Every day | NASAL | 0 refills | Status: DC

## 2022-01-23 MED ORDER — GUAIFENESIN ER 600 MG PO TB12: 600.0000 mg | ORAL_TABLET | Freq: Two times a day (BID) | ORAL | 0 refills | Status: DC

## 2022-01-23 MED ORDER — PROMETHAZINE-DM 6.25-15 MG/5ML PO SYRP: 5.0000 mL | ORAL_SOLUTION | Freq: Four times a day (QID) | ORAL | 0 refills | Status: DC | PRN

## 2022-01-23 MED ORDER — IPRATROPIUM BROMIDE 0.03 % NA SOLN: 2.0000 | Freq: Two times a day (BID) | NASAL | 0 refills | Status: DC

## 2022-01-23 NOTE — Progress Notes (Signed)
Virtual Visit Consent   Juan [name], you are scheduled for a virtual visit with a [facility] provider today. Just as with appointments in the office, your consent must be obtained to participate. Your consent will be active for this visit and any virtual visit you may have with one of our providers in the next 365 days. If you have a MyChart account, a copy of this consent can be sent to you electronically.  As this is a virtual visit, video technology does not allow for your provider to perform a traditional examination. This may limit your provider's ability to fully assess your condition. If your provider identifies any concerns that need to be evaluated in person or the need to arrange testing (such as labs, EKG, etc.), we will make arrangements to do so. Although advances in technology are sophisticated, we cannot ensure that it will always work on either your end or our end. If the connection with a video visit is poor, the visit may have to be switched to a telephone visit. With either a video or telephone visit, we are not always able to ensure that we have a secure connection.  By engaging in this virtual visit, you consent to the provision of healthcare and authorize for your insurance to be billed (if applicable) for the services provided during this visit. Depending on your insurance coverage, you may receive a charge related to this service.  I need to obtain your verbal consent now. Are you willing to proceed with your visit today? Juan [name] has provided verbal consent on 01/23/2022 for a virtual visit (video or telephone). Mar Daring, PA-C  Date: 01/23/2022 3:24 PM  Virtual Visit via Video Note   I, Mar Daring, connected with  Juan [name]  ([id number], October 26, 1955) on 01/23/22 at  3:15 PM EST by a video-enabled telemedicine application and verified that I am speaking with the correct person using two identifiers.  Location: Patient: Virtual Visit Location Patient:  Home Provider: Virtual Visit Location Provider: Home Office   I discussed the limitations of evaluation and management by telemedicine and the availability of in person appointments. The patient expressed understanding and agreed to proceed.    History of Present Illness: Juan [name] is a 67 y.o. who identifies as a male who was assigned male at birth, and is being seen today for URI symptoms.  HPI: URI  This is a new problem. The current episode started yesterday. The problem has been gradually worsening. There has been no fever. Associated symptoms include congestion, coughing (mild), headaches, rhinorrhea (and post nasal drainage) and a sore throat (scratchy). Pertinent negatives include no diarrhea, ear pain, nausea, plugged ear sensation, sinus pain or vomiting. Associated symptoms comments: myalgias. Treatments tried: ibuprofen, nettipot. The treatment provided no relief.    Negative at home Covid testing.    Problems:  Patient Active Problem List   Diagnosis Date Noted   Changing skin lesion 10/28/2021   Flu vaccine need 10/25/2021   Squamous blepharitis of right lower eyelid 10/25/2021   Benign prostatic hyperplasia without lower urinary tract symptoms 10/23/2021   Lesion of right eyelid 10/23/2021   Need for vaccination 05/12/2021   Prolapsed internal hemorrhoids, grade 2 10/23/2020   Chronic hyperglycemia 09/12/2019   Coronary atherosclerosis due to calcified coronary lesion 06/25/2019   Hyperglyceridemia, pure 01/31/2019   Current mild episode of major depressive disorder without prior episode ([place]) 02/28/2018   Degenerative arthritis of knee, bilateral 07/22/2016   Degenerative arthritis of right knee  10/22/2015   Herniation of lumbar intervertebral disc 12/05/2014   Hyperlipidemia LDL goal <130 08/28/2014   Routine general medical examination at a health care facility 06/25/2010   Hypogonadism male 05/30/2009   Erectile dysfunction of organic origin 05/27/2009    Essential hypertension 04/25/2009   Personal history of colonic polyps - adenoma 04/24/2008    Allergies:  Allergies  Allergen Reactions   Ace Inhibitors     REACTION: cough   Medications:  Current Outpatient Medications:    fluticasone (FLONASE) 50 MCG/ACT nasal spray, Place 2 sprays into both nostrils daily., Disp: 16 g, Rfl: 0   guaiFENesin (MUCINEX) 600 MG 12 hr tablet, Take 1 tablet (600 mg total) by mouth 2 (two) times daily., Disp: 30 tablet, Rfl: 0   ipratropium (ATROVENT) 0.03 % nasal spray, Place 2 sprays into both nostrils every 12 (twelve) hours., Disp: 30 mL, Rfl: 0   promethazine-dextromethorphan (PROMETHAZINE-DM) 6.25-15 MG/5ML syrup, Take 5 mLs by mouth 4 (four) times daily as needed., Disp: 118 mL, Rfl: 0   aspirin 81 MG EC tablet, TAKE 1 TABLET(81 MG) BY MOUTH DAILY, Disp: 90 tablet, Rfl: 3   atorvastatin (LIPITOR) 40 MG tablet, Take 1 tablet (40 mg total) by mouth daily., Disp: 90 tablet, Rfl: 3   carvedilol (COREG) 3.125 MG tablet, TAKE 1 TABLET(3.125 MG) BY MOUTH TWICE DAILY WITH A MEAL, Disp: 180 tablet, Rfl: 1   indapamide (LOZOL) 1.25 MG tablet, TAKE 1 TABLET(1.25 MG) BY MOUTH DAILY, Disp: 90 tablet, Rfl: 1   losartan (COZAAR) 100 MG tablet, TAKE 1 TABLET(100 MG) BY MOUTH DAILY, Disp: 90 tablet, Rfl: 1   tadalafil (CIALIS) 5 MG tablet, TAKE 1 TABLET(5 MG) BY MOUTH DAILY AS NEEDED FOR ERECTILE DYSFUNCTION, Disp: 90 tablet, Rfl: 1   tiZANidine (ZANAFLEX) 4 MG tablet, TAKE 1 TABLET(4 MG) BY MOUTH EVERY 6 HOURS AS NEEDED FOR MUSCLE SPASMS, Disp: 90 tablet, Rfl: 1   traZODone (DESYREL) 50 MG tablet, TAKE 1 TO 2 TABLETS BY MOUTH AT BEDTIME AS NEEDED, Disp: 180 tablet, Rfl: 1   XYOSTED 75 MG/0.5ML SOAJ, INJECT '75MG'$  SUBCUTANEOUSLY ONCE WEEKLY AS DIRECTED, Disp: 6 mL, Rfl: 0  Observations/Objective: Patient is well-developed, well-nourished in no acute distress.  Resting comfortably at home.  Head is normocephalic, atraumatic.  No labored breathing.  Speech is clear and  coherent with logical content.  Patient is alert and oriented at baseline.    Assessment and Plan: 1. Viral URI with cough - fluticasone (FLONASE) 50 MCG/ACT nasal spray; Place 2 sprays into both nostrils daily.  Dispense: 16 g; Refill: 0 - ipratropium (ATROVENT) 0.03 % nasal spray; Place 2 sprays into both nostrils every 12 (twelve) hours.  Dispense: 30 mL; Refill: 0 - promethazine-dextromethorphan (PROMETHAZINE-DM) 6.25-15 MG/5ML syrup; Take 5 mLs by mouth 4 (four) times daily as needed.  Dispense: 118 mL; Refill: 0 - guaiFENesin (MUCINEX) 600 MG 12 hr tablet; Take 1 tablet (600 mg total) by mouth 2 (two) times daily.  Dispense: 30 tablet; Refill: 0  - Suspect viral URI - Fluticasone, Ipratropium bromide nasal spray, Mucinex, and Promethazine DM prescribed - Symptomatic medications of choice over the counter as needed - Push fluids - Rest - Seek further evaluation if symptoms change or worsen   Follow Up Instructions: I discussed the assessment and treatment plan with the patient. The patient was provided an opportunity to ask questions and all were answered. The patient agreed with the plan and demonstrated an understanding of the instructions.  A copy of instructions were  sent to the patient via MyChart unless otherwise noted below.    The patient was advised to call back or seek an in-person evaluation if the symptoms worsen or if the condition fails to improve as anticipated.  Time:  I spent 12 minutes with the patient via telehealth technology discussing the above problems/concerns.    Mar Daring, PA-C

## 2022-01-23 NOTE — Patient Instructions (Signed)
Lamar Sprinkles, thank you for joining Mar Daring, PA-C for today's virtual visit.  While this provider is not your primary care provider (PCP), if your PCP is located in our provider database this encounter information will be shared with them immediately following your visit.   [facility] account gives you access to today's visit and all your visits, tests, and labs performed at [facility] " click here if you don't have a [facility] account or go to [email or web address]  Consent: (Patient) Juan [name] provided verbal consent for this virtual visit at the beginning of the encounter.  Current Medications:  Current Outpatient Medications:    fluticasone (FLONASE) 50 MCG/ACT nasal spray, Place 2 sprays into both nostrils daily., Disp: 16 g, Rfl: 0   guaiFENesin (MUCINEX) 600 MG 12 hr tablet, Take 1 tablet (600 mg total) by mouth 2 (two) times daily., Disp: 30 tablet, Rfl: 0   ipratropium (ATROVENT) 0.03 % nasal spray, Place 2 sprays into both nostrils every 12 (twelve) hours., Disp: 30 mL, Rfl: 0   promethazine-dextromethorphan (PROMETHAZINE-DM) 6.25-15 MG/5ML syrup, Take 5 mLs by mouth 4 (four) times daily as needed., Disp: 118 mL, Rfl: 0   aspirin 81 MG EC tablet, TAKE 1 TABLET(81 MG) BY MOUTH DAILY, Disp: 90 tablet, Rfl: 3   atorvastatin (LIPITOR) 40 MG tablet, Take 1 tablet (40 mg total) by mouth daily., Disp: 90 tablet, Rfl: 3   carvedilol (COREG) 3.125 MG tablet, TAKE 1 TABLET(3.125 MG) BY MOUTH TWICE DAILY WITH A MEAL, Disp: 180 tablet, Rfl: 1   indapamide (LOZOL) 1.25 MG tablet, TAKE 1 TABLET(1.25 MG) BY MOUTH DAILY, Disp: 90 tablet, Rfl: 1   losartan (COZAAR) 100 MG tablet, TAKE 1 TABLET(100 MG) BY MOUTH DAILY, Disp: 90 tablet, Rfl: 1   tadalafil (CIALIS) 5 MG tablet, TAKE 1 TABLET(5 MG) BY MOUTH DAILY AS NEEDED FOR ERECTILE DYSFUNCTION, Disp: 90 tablet, Rfl: 1   tiZANidine (ZANAFLEX) 4 MG tablet, TAKE 1 TABLET(4 MG) BY MOUTH EVERY 6 HOURS AS  NEEDED FOR MUSCLE SPASMS, Disp: 90 tablet, Rfl: 1   traZODone (DESYREL) 50 MG tablet, TAKE 1 TO 2 TABLETS BY MOUTH AT BEDTIME AS NEEDED, Disp: 180 tablet, Rfl: 1   XYOSTED 75 MG/0.5ML SOAJ, INJECT '75MG'$  SUBCUTANEOUSLY ONCE WEEKLY AS DIRECTED, Disp: 6 mL, Rfl: 0   Medications ordered in this encounter:  Meds ordered this encounter  Medications   fluticasone (FLONASE) 50 MCG/ACT nasal spray    Sig: Place 2 sprays into both nostrils daily.    Dispense:  16 g    Refill:  0    Order Specific Question:   Supervising Provider    Answer:   Chase Picket [[id number]]   ipratropium (ATROVENT) 0.03 % nasal spray    Sig: Place 2 sprays into both nostrils every 12 (twelve) hours.    Dispense:  30 mL    Refill:  0    Order Specific Question:   Supervising Provider    Answer:   Chase Picket [id number]   promethazine-dextromethorphan (PROMETHAZINE-DM) 6.25-15 MG/5ML syrup    Sig: Take 5 mLs by mouth 4 (four) times daily as needed.    Dispense:  118 mL    Refill:  0    Order Specific Question:   Supervising Provider    Answer:   Chase Picket [[id number]]   guaiFENesin (MUCINEX) 600 MG 12 hr tablet    Sig: Take 1 tablet (600 mg total) by mouth 2 (two) times daily.  Dispense:  30 tablet    Refill:  0    Order Specific Question:   Supervising Provider    Answer:   Chase Picket [id number]     *If you need refills on other medications prior to your next appointment, please contact your pharmacy*  Follow-Up: Call back or seek an in-person evaluation if the symptoms worsen or if the condition fails to improve as anticipated.  [facility] [phone]  Other Instructions  Upper Respiratory Infection, Adult An upper respiratory infection (URI) is a common viral infection of the nose, throat, and upper air passages that lead to the lungs. The most common type of URI is the common cold. URIs usually get better on their own, without medical treatment. What are the causes? A  URI is caused by a virus. You may catch a virus by: Breathing in droplets from an infected person's cough or sneeze. Touching something that has been exposed to the virus (is contaminated) and then touching your mouth, nose, or eyes. What increases the risk? You are more likely to get a URI if: You are very young or very old. You have close contact with others, such as at work, school, or a health care facility. You smoke. You have long-term (chronic) heart or lung disease. You have a weakened disease-fighting system (immune system). You have nasal allergies or asthma. You are experiencing a lot of stress. You have poor nutrition. What are the signs or symptoms? A URI usually involves some of the following symptoms: Runny or stuffy (congested) nose. Cough. Sneezing. Sore throat. Headache. Fatigue. Fever. Loss of appetite. Pain in your forehead, behind your eyes, and over your cheekbones (sinus pain). Muscle aches. Redness or irritation of the eyes. Pressure in the ears or face. How is this diagnosed? This condition may be diagnosed based on your medical history and symptoms, and a physical exam. Your health care provider may use a swab to take a mucus sample from your nose (nasal swab). This sample can be tested to determine what virus is causing the illness. How is this treated? URIs usually get better on their own within 7-10 days. Medicines cannot cure URIs, but your health care provider may recommend certain medicines to help relieve symptoms, such as: Over-the-counter cold medicines. Cough suppressants. Coughing is a type of defense against infection that helps to clear the respiratory system, so take these medicines only as recommended by your health care provider. Fever-reducing medicines. Follow these instructions at home: Activity Rest as needed. If you have a fever, stay home from work or school until your fever is gone or until your health care provider says your URI  cannot spread to other people (is no longer contagious). Your health care provider may have you wear a face mask to prevent your infection from spreading. Relieving symptoms Gargle with a mixture of salt and water 3-4 times a day or as needed. To make salt water, completely dissolve -1 tsp (3-6 g) of salt in 1 cup (237 mL) of warm water. Use a cool-mist humidifier to add moisture to the air. This can help you breathe more easily. Eating and drinking  Drink enough fluid to keep your urine pale yellow. Eat soups and other clear broths. General instructions  Take over-the-counter and prescription medicines only as told by your health care provider. These include cold medicines, fever reducers, and cough suppressants. Do not use any products that contain nicotine or tobacco. These products include cigarettes, chewing tobacco, and vaping  devices, such as e-cigarettes. If you need help quitting, ask your health care provider. Stay away from secondhand smoke. Stay up to date on all immunizations, including the yearly (annual) flu vaccine. Keep all follow-up visits. This is important. How to prevent the spread of infection to others URIs can be contagious. To prevent the infection from spreading: Wash your hands with soap and water for at least 20 seconds. If soap and water are not available, use hand sanitizer. Avoid touching your mouth, face, eyes, or nose. Cough or sneeze into a tissue or your sleeve or elbow instead of into your hand or into the air.  Contact a health care provider if: You are getting worse instead of better. You have a fever or chills. Your mucus is brown or red. You have yellow or brown discharge coming from your nose. You have pain in your face, especially when you bend forward. You have swollen neck glands. You have pain while swallowing. You have white areas in the back of your throat. Get help right away if: You have shortness of breath that gets worse. You have  severe or persistent: Headache. Ear pain. Sinus pain. Chest pain. You have chronic lung disease along with any of the following: Making high-pitched whistling sounds when you breathe, most often when you breathe out (wheezing). Prolonged cough (more than 14 days). Coughing up blood. A change in your usual mucus. You have a stiff neck. You have changes in your: Vision. Hearing. Thinking. Mood. These symptoms may be an emergency. Get help right away. Call 911. Do not wait to see if the symptoms will go away. Do not drive yourself to the hospital. Summary An upper respiratory infection (URI) is a common infection of the nose, throat, and upper air passages that lead to the lungs. A URI is caused by a virus. URIs usually get better on their own within 7-10 days. Medicines cannot cure URIs, but your health care provider may recommend certain medicines to help relieve symptoms. This information is not intended to replace advice given to you by your health care provider. Make sure you discuss any questions you have with your health care provider. Document Revised: 07/24/2020 Document Reviewed: 07/24/2020 Elsevier Patient Education  [place].    If you have been instructed to have an in-person evaluation today at a local Urgent Care facility, please use the link below. It will take you to a list of all of our available [facility], including address, phone number and hours of operation. Please do not delay care.  [facility]  If you or a family member do not have a primary care provider, use the link below to schedule a visit and establish care. When you choose a [facility] primary care physician or advanced practice provider, you gain a long-term partner in health. Find a Primary Care Provider  Learn more about [facility]'s in-office and virtual care options: [facility]

## 2022-02-18 ENCOUNTER — Encounter: Payer: Self-pay | Admitting: Internal Medicine

## 2022-02-18 DIAGNOSIS — J069 Acute upper respiratory infection, unspecified: Secondary | ICD-10-CM

## 2022-02-18 DIAGNOSIS — U071 COVID-19: Secondary | ICD-10-CM

## 2022-02-22 ENCOUNTER — Other Ambulatory Visit: Payer: Self-pay | Admitting: Internal Medicine

## 2022-02-22 ENCOUNTER — Telehealth: Payer: Medicare Other | Admitting: Family

## 2022-02-22 DIAGNOSIS — U071 COVID-19: Secondary | ICD-10-CM

## 2022-02-22 MED ORDER — NIRMATRELVIR/RITONAVIR (PAXLOVID)TABLET: 3.0000 | ORAL_TABLET | Freq: Two times a day (BID) | ORAL | 0 refills | Status: AC

## 2022-02-22 MED ORDER — PROMETHAZINE-DM 6.25-15 MG/5ML PO SYRP: 5.0000 mL | ORAL_SOLUTION | Freq: Four times a day (QID) | ORAL | 0 refills | Status: AC | PRN

## 2022-02-22 NOTE — Progress Notes (Signed)
PT had visit with PCP. Will cancel this visit.   Evelina Dun, FNP

## 2022-04-04 ENCOUNTER — Other Ambulatory Visit: Payer: Self-pay | Admitting: Internal Medicine

## 2022-04-04 DIAGNOSIS — F409 Phobic anxiety disorder, unspecified: Secondary | ICD-10-CM

## 2022-04-04 DIAGNOSIS — I10 Essential (primary) hypertension: Secondary | ICD-10-CM

## 2022-04-27 ENCOUNTER — Ambulatory Visit: Payer: Medicare Other | Admitting: Internal Medicine

## 2022-05-06 ENCOUNTER — Other Ambulatory Visit: Payer: Self-pay | Admitting: Internal Medicine

## 2022-05-06 DIAGNOSIS — F409 Phobic anxiety disorder, unspecified: Secondary | ICD-10-CM

## 2022-05-06 DIAGNOSIS — I10 Essential (primary) hypertension: Secondary | ICD-10-CM

## 2022-05-07 ENCOUNTER — Other Ambulatory Visit: Payer: Self-pay | Admitting: Internal Medicine

## 2022-05-07 DIAGNOSIS — I2510 Atherosclerotic heart disease of native coronary artery without angina pectoris: Secondary | ICD-10-CM

## 2022-05-11 ENCOUNTER — Ambulatory Visit (INDEPENDENT_AMBULATORY_CARE_PROVIDER_SITE_OTHER): Payer: Medicare Other | Admitting: Internal Medicine

## 2022-05-11 ENCOUNTER — Encounter: Payer: Self-pay | Admitting: Internal Medicine

## 2022-05-11 VITALS — BP 136/78 | HR 59 | Temp 98.2°F | Resp 16 | Ht 72.0 in | Wt 191.0 lb

## 2022-05-11 DIAGNOSIS — E291 Testicular hypofunction: Secondary | ICD-10-CM

## 2022-05-11 DIAGNOSIS — I2510 Atherosclerotic heart disease of native coronary artery without angina pectoris: Secondary | ICD-10-CM | POA: Diagnosis not present

## 2022-05-11 DIAGNOSIS — F5104 Psychophysiologic insomnia: Secondary | ICD-10-CM | POA: Diagnosis not present

## 2022-05-11 DIAGNOSIS — E785 Hyperlipidemia, unspecified: Secondary | ICD-10-CM

## 2022-05-11 DIAGNOSIS — I2584 Coronary atherosclerosis due to calcified coronary lesion: Secondary | ICD-10-CM

## 2022-05-11 DIAGNOSIS — F320 Major depressive disorder, single episode, mild: Secondary | ICD-10-CM

## 2022-05-11 DIAGNOSIS — I10 Essential (primary) hypertension: Secondary | ICD-10-CM | POA: Diagnosis not present

## 2022-05-11 LAB — CBC WITH DIFFERENTIAL/PLATELET
Basophils Absolute: 0.1 10*3/uL (ref 0.0–0.1)
Basophils Relative: 1.1 % (ref 0.0–3.0)
Eosinophils Absolute: 0.2 10*3/uL (ref 0.0–0.7)
Eosinophils Relative: 4.6 % (ref 0.0–5.0)
HCT: 43.2 % (ref 39.0–52.0)
Hemoglobin: 14.8 g/dL (ref 13.0–17.0)
Lymphocytes Relative: 23.4 % (ref 12.0–46.0)
Lymphs Abs: 1.1 10*3/uL (ref 0.7–4.0)
MCHC: 34.1 g/dL (ref 30.0–36.0)
MCV: 88.5 fl (ref 78.0–100.0)
Monocytes Absolute: 0.5 10*3/uL (ref 0.1–1.0)
Monocytes Relative: 9.6 % (ref 3.0–12.0)
Neutro Abs: 2.9 10*3/uL (ref 1.4–7.7)
Neutrophils Relative %: 61.3 % (ref 43.0–77.0)
Platelets: 295 10*3/uL (ref 150.0–400.0)
RBC: 4.89 Mil/uL (ref 4.22–5.81)
RDW: 13.8 % (ref 11.5–15.5)
WBC: 4.8 10*3/uL (ref 4.0–10.5)

## 2022-05-11 LAB — BASIC METABOLIC PANEL
BUN: 19 mg/dL (ref 6–23)
CO2: 27 mEq/L (ref 19–32)
Calcium: 9.1 mg/dL (ref 8.4–10.5)
Chloride: 101 mEq/L (ref 96–112)
Creatinine, Ser: 0.9 mg/dL (ref 0.40–1.50)
GFR: 88.68 mL/min (ref >60.00)
Glucose, Bld: 112 mg/dL — ABNORMAL HIGH (ref 70–99)
Potassium: 3.8 mEq/L (ref 3.5–5.1)
Sodium: 137 mEq/L (ref 135–145)

## 2022-05-11 LAB — LIPID PANEL
Cholesterol: 130 mg/dL (ref 0–200)
HDL: 44.3 mg/dL (ref >39.00)
LDL Cholesterol: 47 mg/dL (ref 0–99)
NonHDL: 85.57
Total CHOL/HDL Ratio: 3
Triglycerides: 193 mg/dL — ABNORMAL HIGH (ref 0.0–149.0)
VLDL: 38.6 mg/dL (ref 0.0–40.0)

## 2022-05-11 LAB — HEPATIC FUNCTION PANEL
ALT: 24 U/L (ref 0–53)
AST: 17 U/L (ref 0–37)
Albumin: 4 g/dL (ref 3.5–5.2)
Alkaline Phosphatase: 30 U/L — ABNORMAL LOW (ref 39–117)
Bilirubin, Direct: 0.1 mg/dL (ref 0.0–0.3)
Total Bilirubin: 0.4 mg/dL (ref 0.2–1.2)
Total Protein: 6.5 g/dL (ref 6.0–8.3)

## 2022-05-11 MED ORDER — TRAZODONE HCL 300 MG PO TABS: 300.0000 mg | ORAL_TABLET | Freq: Every day | ORAL | 1 refills | Status: DC

## 2022-05-11 NOTE — Progress Notes (Unsigned)
Subjective:  Patient ID: Juan [name], male    DOB: 02/13/1955  Age: 67 y.o. MRN: [id number]  CC: Hypertension, Depression, and Hyperlipidemia   HPI Juan [name] presents for f/up ----  He is active and denies chest pain, shortness of breath, diaphoresis, or edema.  He continues to complain of insomnia with frequent awakening and would like to increase the dose of trazodone.  Outpatient Medications Prior to Visit  Medication Sig Dispense Refill   aspirin EC (ASPIRIN LOW DOSE) 81 MG tablet Take 1 tablet (81 mg total) by mouth daily. 90 tablet 1   atorvastatin (LIPITOR) 40 MG tablet Take 1 tablet (40 mg total) by mouth daily. 90 tablet 3   carvedilol (COREG) 3.125 MG tablet TAKE 1 TABLET(3.125 MG) BY MOUTH TWICE DAILY WITH A MEAL 180 tablet 0   fluticasone (FLONASE) 50 MCG/ACT nasal spray Place 2 sprays into both nostrils daily. 16 g 0   guaiFENesin (MUCINEX) 600 MG 12 hr tablet Take 1 tablet (600 mg total) by mouth 2 (two) times daily. 30 tablet 0   indapamide (LOZOL) 1.25 MG tablet TAKE 1 TABLET(1.25 MG) BY MOUTH DAILY 90 tablet 0   ipratropium (ATROVENT) 0.03 % nasal spray Place 2 sprays into both nostrils every 12 (twelve) hours. 30 mL 0   losartan (COZAAR) 100 MG tablet TAKE 1 TABLET(100 MG) BY MOUTH DAILY 90 tablet 0   tadalafil (CIALIS) 5 MG tablet TAKE 1 TABLET(5 MG) BY MOUTH DAILY AS NEEDED FOR ERECTILE DYSFUNCTION 90 tablet 1   tiZANidine (ZANAFLEX) 4 MG tablet TAKE 1 TABLET(4 MG) BY MOUTH EVERY 6 HOURS AS NEEDED FOR MUSCLE SPASMS 90 tablet 1   XYOSTED 75 MG/0.5ML SOAJ INJECT 75MG  SUBCUTANEOUSLY ONCE WEEKLY AS DIRECTED 6 mL 0   traZODone (DESYREL) 50 MG tablet TAKE 1 TO 2 TABLETS BY MOUTH AT BEDTIME AS NEEDED 180 tablet 0   No facility-administered medications prior to visit.    ROS Review of Systems  Constitutional: Negative.  Negative for chills, diaphoresis, fatigue and fever.  HENT: Negative.    Eyes: Negative.   Respiratory:  Negative for cough, chest tightness,  shortness of breath and wheezing.   Cardiovascular:  Negative for chest pain, palpitations and leg swelling.  Gastrointestinal:  Negative for abdominal pain, diarrhea, nausea and vomiting.  Endocrine: Negative.   Genitourinary: Negative.  Negative for difficulty urinating.  Musculoskeletal:  Positive for arthralgias. Negative for myalgias.  Skin: Negative.   Hematological:  Negative for adenopathy. Does not bruise/bleed easily.  Psychiatric/Behavioral:  Positive for sleep disturbance. Negative for confusion, decreased concentration and dysphoric mood. The patient is not nervous/anxious.     Objective:  BP 136/78 (BP Location: Left Arm, Patient Position: Sitting, Cuff Size: Large)   Pulse (!) 59   Temp 98.2 F (36.8 C) (Oral)   Resp 16   Ht 6' (1.829 m)   Wt 191 lb (86.6 kg)   SpO2 94%   BMI 25.90 kg/m   BP Readings from Last 3 Encounters:  05/11/22 136/78  10/28/21 137/84  10/23/21 128/84    Wt Readings from Last 3 Encounters:  05/11/22 191 lb (86.6 kg)  10/28/21 197 lb 9.6 oz (89.6 kg)  10/23/21 199 lb (90.3 kg)    Physical Exam Vitals reviewed.  HENT:     Mouth/Throat:     Mouth: Mucous membranes are moist.  Eyes:     General: No scleral icterus.    Conjunctiva/sclera: Conjunctivae normal.  Cardiovascular:     Rate and Rhythm: Regular rhythm.  Bradycardia present.     Heart sounds: Normal heart sounds, S1 normal and S2 normal. No murmur heard.    No gallop.  Pulmonary:     Effort: Pulmonary effort is normal.     Breath sounds: No stridor. No wheezing, rhonchi or rales.  Abdominal:     General: Abdomen is flat.     Palpations: There is no mass.     Tenderness: There is no abdominal tenderness. There is no guarding.     Hernia: No hernia is present.  Musculoskeletal:        General: Normal range of motion.     Cervical back: Neck supple.     Right lower leg: No edema.     Left lower leg: No edema.  Skin:    General: Skin is warm and dry.     Coloration:  Skin is not pale.  Neurological:     General: No focal deficit present.     Mental Status: He is alert. Mental status is at baseline.  Psychiatric:        Mood and Affect: Mood normal.        Behavior: Behavior normal.     Lab Results  Component Value Date   WBC 4.8 05/11/2022   HGB 14.8 05/11/2022   HCT 43.2 05/11/2022   PLT 295.0 05/11/2022   GLUCOSE 112 (H) 05/11/2022   CHOL 130 05/11/2022   TRIG 193.0 (H) 05/11/2022   HDL 44.30 05/11/2022   LDLDIRECT 98.0 02/28/2018   LDLCALC 47 05/11/2022   ALT 24 05/11/2022   AST 17 05/11/2022   NA 137 05/11/2022   K 3.8 05/11/2022   CL 101 05/11/2022   CREATININE 0.90 05/11/2022   BUN 19 05/11/2022   CO2 27 05/11/2022   TSH 1.07 10/23/2021   PSA 0.21 10/23/2021   INR 1.0 09/12/2019   HGBA1C 5.9 10/23/2021    No results found.  Assessment & Plan:   Current mild episode of major depressive disorder without prior episode (HCC) -     traZODone HCl; Take 1 tablet (300 mg total) by mouth at bedtime.  Dispense: 90 tablet; Refill: 1 -     Hepatic function panel; Future  Essential hypertension- His blood pressure is well-controlled. -     Basic metabolic panel; Future -     CBC with Differential/Platelet; Future -     Hepatic function panel; Future  Hyperlipidemia LDL goal <130 - LDL goal achieved. Doing well on the statin  -     Lipid panel; Future -     Hepatic function panel; Future  Psychophysiological insomnia- Will increase the dose of trazodone. -     traZODone HCl; Take 1 tablet (300 mg total) by mouth at bedtime.  Dispense: 90 tablet; Refill: 1  Hypogonadism male -     CBC with Differential/Platelet; Future  Coronary atherosclerosis due to calcified coronary lesion -     Ambulatory referral to Cardiology     Follow-up: Return in about 6 months (around 11/11/2022).  Sanda Linger, MD

## 2022-05-11 NOTE — Patient Instructions (Signed)
Hypertension, Adult High blood pressure (hypertension) is when the force of blood pumping through the arteries is too strong. The arteries are the blood vessels that carry blood from the heart throughout the body. Hypertension forces the heart to work harder to pump blood and may cause arteries to become narrow or stiff. Untreated or uncontrolled hypertension can lead to a heart attack, heart failure, a stroke, kidney disease, and other problems. A blood pressure reading consists of a higher number over a lower number. Ideally, your blood pressure should be below 120/80. The first ("top") number is called the systolic pressure. It is a measure of the pressure in your arteries as your heart beats. The second ("bottom") number is called the diastolic pressure. It is a measure of the pressure in your arteries as the heart relaxes. What are the causes? The exact cause of this condition is not known. There are some conditions that result in high blood pressure. What increases the risk? Certain factors may make you more likely to develop high blood pressure. Some of these risk factors are under your control, including: Smoking. Not getting enough exercise or physical activity. Being overweight. Having too much fat, sugar, calories, or salt (sodium) in your diet. Drinking too much alcohol. Other risk factors include: Having a personal history of heart disease, diabetes, high cholesterol, or kidney disease. Stress. Having a family history of high blood pressure and high cholesterol. Having obstructive sleep apnea. Age. The risk increases with age. What are the signs or symptoms? High blood pressure may not cause symptoms. Very high blood pressure (hypertensive crisis) may cause: Headache. Fast or irregular heartbeats (palpitations). Shortness of breath. Nosebleed. Nausea and vomiting. Vision changes. Severe chest pain, dizziness, and seizures. How is this diagnosed? This condition is diagnosed by  measuring your blood pressure while you are seated, with your arm resting on a flat surface, your legs uncrossed, and your feet flat on the floor. The cuff of the blood pressure monitor will be placed directly against the skin of your upper arm at the level of your heart. Blood pressure should be measured at least twice using the same arm. Certain conditions can cause a difference in blood pressure between your right and left arms. If you have a high blood pressure reading during one visit or you have normal blood pressure with other risk factors, you may be asked to: Return on a different day to have your blood pressure checked again. Monitor your blood pressure at home for 1 week or longer. If you are diagnosed with hypertension, you may have other blood or imaging tests to help your health care provider understand your overall risk for other conditions. How is this treated? This condition is treated by making healthy lifestyle changes, such as eating healthy foods, exercising more, and reducing your alcohol intake. You may be referred for counseling on a healthy diet and physical activity. Your health care provider may prescribe medicine if lifestyle changes are not enough to get your blood pressure under control and if: Your systolic blood pressure is above 130. Your diastolic blood pressure is above 80. Your personal target blood pressure may vary depending on your medical conditions, your age, and other factors. Follow these instructions at home: Eating and drinking  Eat a diet that is high in fiber and potassium, and low in sodium, added sugar, and fat. An example of this eating plan is called the DASH diet. DASH stands for Dietary Approaches to Stop Hypertension. To eat this way: Eat   plenty of fresh fruits and vegetables. Try to fill one half of your plate at each meal with fruits and vegetables. Eat whole grains, such as whole-wheat pasta, brown rice, or whole-grain bread. Fill about one  fourth of your plate with whole grains. Eat or drink low-fat dairy products, such as skim milk or low-fat yogurt. Avoid fatty cuts of meat, processed or cured meats, and poultry with skin. Fill about one fourth of your plate with lean proteins, such as fish, chicken without skin, beans, eggs, or tofu. Avoid pre-made and processed foods. These tend to be higher in sodium, added sugar, and fat. Reduce your daily sodium intake. Many people with hypertension should eat less than 1,500 mg of sodium a day. Do not drink alcohol if: Your health care provider tells you not to drink. You are pregnant, may be pregnant, or are planning to become pregnant. If you drink alcohol: Limit how much you have to: 0-1 drink a day for women. 0-2 drinks a day for men. Know how much alcohol is in your drink. In the [place] one drink equals one 12 oz bottle of beer (355 mL), one 5 oz glass of wine (148 mL), or one 1 oz glass of hard liquor (44 mL). Lifestyle  Work with your health care provider to maintain a healthy body weight or to lose weight. Ask what an ideal weight is for you. Get at least 30 minutes of exercise that causes your heart to beat faster (aerobic exercise) most days of the week. Activities may include walking, swimming, or biking. Include exercise to strengthen your muscles (resistance exercise), such as Pilates or lifting weights, as part of your weekly exercise routine. Try to do these types of exercises for 30 minutes at least 3 days a week. Do not use any products that contain nicotine or tobacco. These products include cigarettes, chewing tobacco, and vaping devices, such as e-cigarettes. If you need help quitting, ask your health care provider. Monitor your blood pressure at home as told by your health care provider. Keep all follow-up visits. This is important. Medicines Take over-the-counter and prescription medicines only as told by your health care provider. Follow directions carefully. Blood  pressure medicines must be taken as prescribed. Do not skip doses of blood pressure medicine. Doing this puts you at risk for problems and can make the medicine less effective. Ask your health care provider about side effects or reactions to medicines that you should watch for. Contact a health care provider if you: Think you are having a reaction to a medicine you are taking. Have headaches that keep coming back (recurring). Feel dizzy. Have swelling in your ankles. Have trouble with your vision. Get help right away if you: Develop a severe headache or confusion. Have unusual weakness or numbness. Feel faint. Have severe pain in your chest or abdomen. Vomit repeatedly. Have trouble breathing. These symptoms may be an emergency. Get help right away. Call 911. Do not wait to see if the symptoms will go away. Do not drive yourself to the hospital. Summary Hypertension is when the force of blood pumping through your arteries is too strong. If this condition is not controlled, it may put you at risk for serious complications. Your personal target blood pressure may vary depending on your medical conditions, your age, and other factors. For most people, a normal blood pressure is less than 120/80. Hypertension is treated with lifestyle changes, medicines, or a combination of both. Lifestyle changes include losing weight, eating a healthy,   low-sodium diet, exercising more, and limiting alcohol. This information is not intended to replace advice given to you by your health care provider. Make sure you discuss any questions you have with your health care provider. Document Revised: 10/29/2020 Document Reviewed: 10/29/2020 Elsevier Patient Education  2023 [employer]  

## 2022-05-18 ENCOUNTER — Encounter: Payer: Self-pay | Admitting: Internal Medicine

## 2022-05-19 ENCOUNTER — Other Ambulatory Visit: Payer: Self-pay | Admitting: Internal Medicine

## 2022-05-19 DIAGNOSIS — E291 Testicular hypofunction: Secondary | ICD-10-CM

## 2022-05-19 MED ORDER — TESTOSTERONE 50 MG/5GM (1%) TD GEL
10.0000 g | Freq: Every day | TRANSDERMAL | 0 refills | Status: DC
Start: 2022-05-19 — End: 2022-11-13

## 2022-05-19 MED ORDER — TESTOSTERONE 50 MG/5GM (1%) TD GEL
10.0000 g | Freq: Every day | TRANSDERMAL | 0 refills | Status: DC
Start: 2022-05-19 — End: 2022-05-19

## 2022-06-09 DIAGNOSIS — L918 Other hypertrophic disorders of the skin: Secondary | ICD-10-CM | POA: Diagnosis not present

## 2022-06-09 DIAGNOSIS — L570 Actinic keratosis: Secondary | ICD-10-CM | POA: Diagnosis not present

## 2022-06-09 DIAGNOSIS — D225 Melanocytic nevi of trunk: Secondary | ICD-10-CM | POA: Diagnosis not present

## 2022-06-09 DIAGNOSIS — L821 Other seborrheic keratosis: Secondary | ICD-10-CM | POA: Diagnosis not present

## 2022-06-09 DIAGNOSIS — L578 Other skin changes due to chronic exposure to nonionizing radiation: Secondary | ICD-10-CM | POA: Diagnosis not present

## 2022-06-11 IMAGING — CT CT CARDIAC CORONARY ARTERY CALCIUM SCORE
3 series · 14 of 20 positions shown, 15 images · non-contrast
Comparison: None.
COMPARISON: None.

Addendum:
EXAM:
OVER-READ INTERPRETATION  CT CHEST

The following report is an over-read performed by radiologist Dr.
Dias Xato Tusamba [REDACTED] on 06/23/2019. This
over-read does not include interpretation of cardiac or coronary
anatomy or pathology. The coronary calcium score interpretation by
the cardiologist is attached.
CLINICAL DATA: Risk stratification
Coronary Calcium Score
TECHNIQUE: The patient was scanned on a Siemens Force scanner. Axial
non-contrast 3 mm slices were carried out through the heart. The
data set was analyzed on a dedicated work station and scored using
the Agatson method.

[Series 2: casc 3.0 bv41 2 bestdiast 70 % · axial · 0.38mm/px · z∈[-260,-182]mm · 4 of 44 slices shown, 5 images]
[im 9/44  vessel]
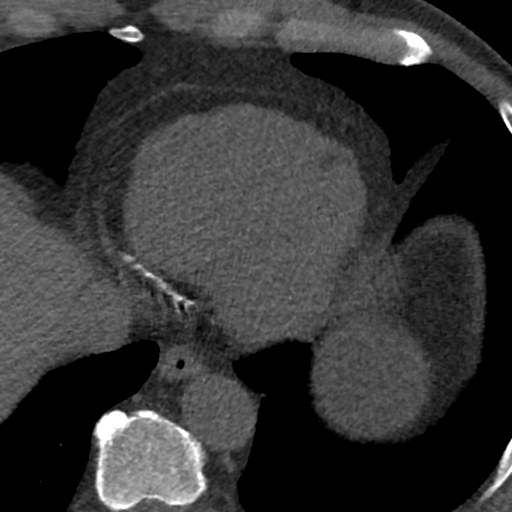
[im 9/44  lung]
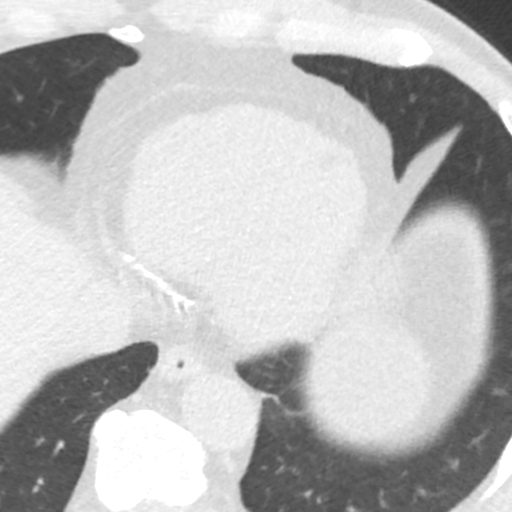
[im 18/44  vessel]
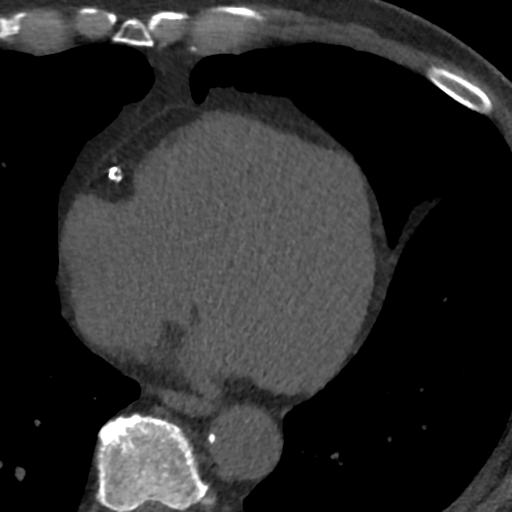
[im 26/44  vessel]
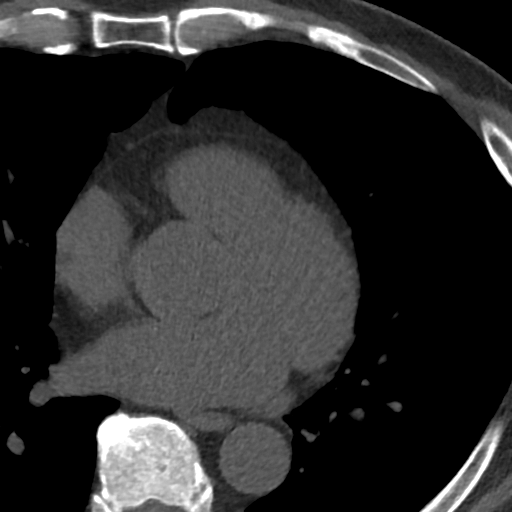
[im 35/44  vessel]
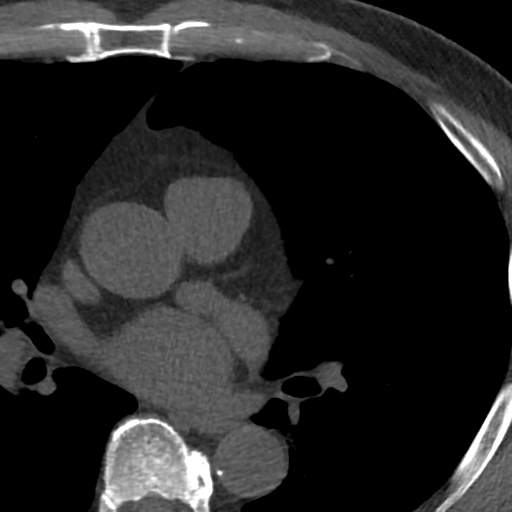

[Series 3: lung 71 % · axial · 0.74mm/px · z∈[-263,-179]mm · 5 of 44 slices shown]
[im 8/44  lung]
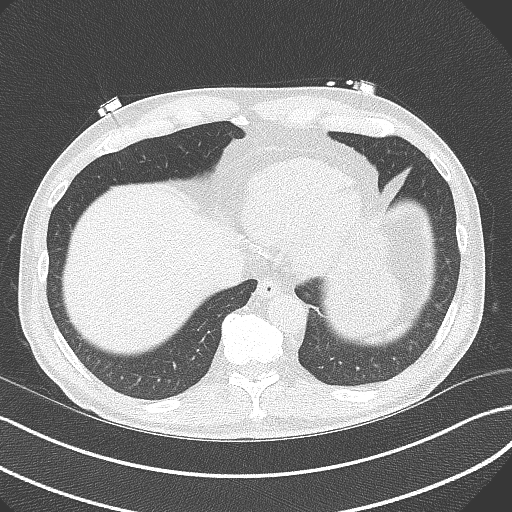
[im 15/44  lung]
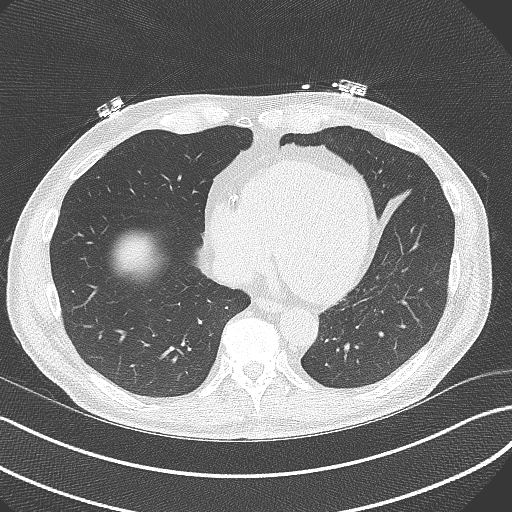
[im 22/44  lung]
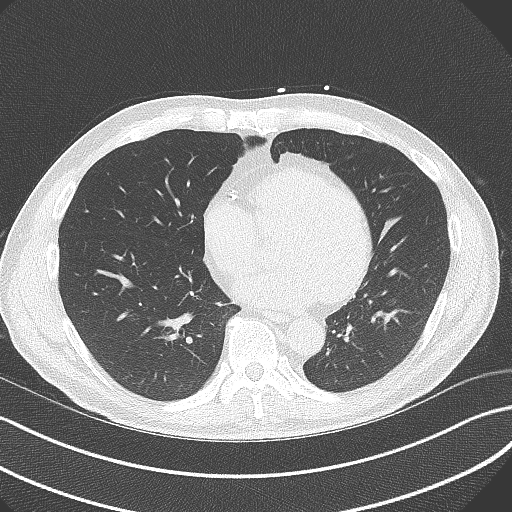
[im 29/44  lung]
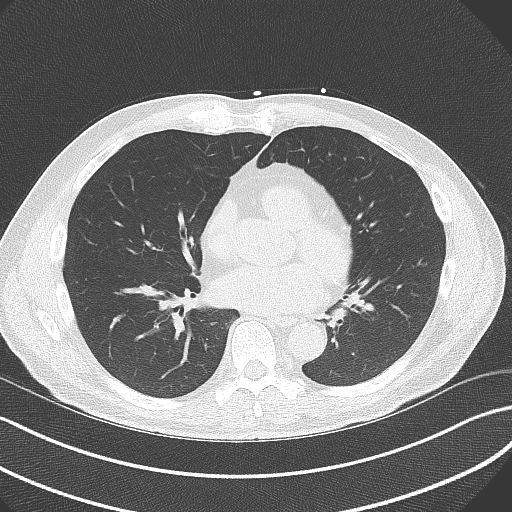
[im 36/44  lung]
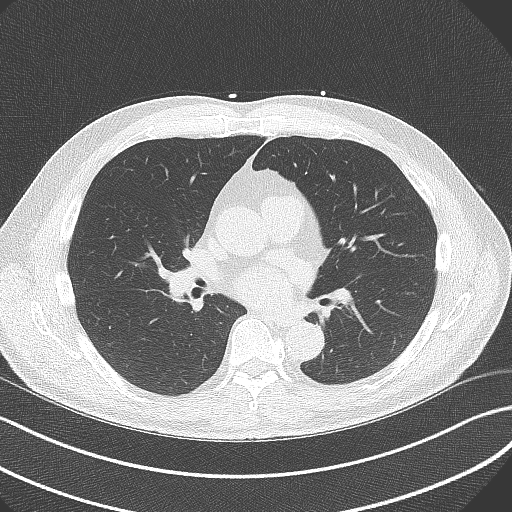

[Series 4: lung st 71 % · axial · 0.74mm/px · z∈[-263,-179]mm · 5 of 44 slices shown]
[im 8/44  lung]
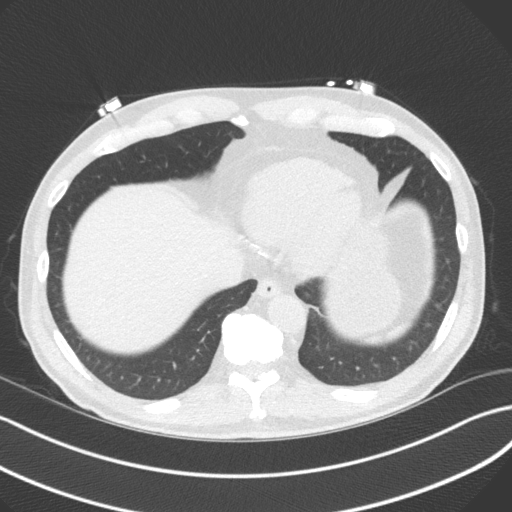
[im 15/44  lung]
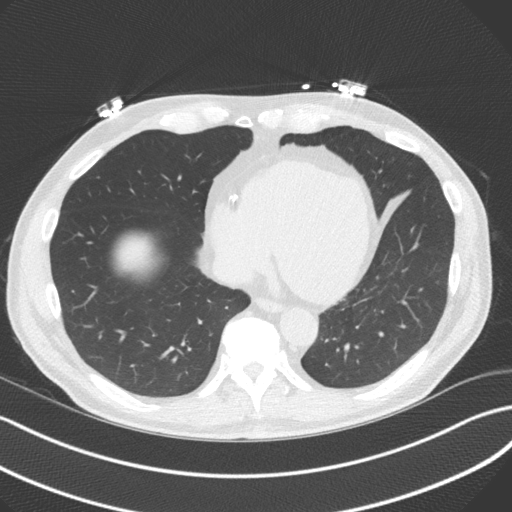
[im 22/44  lung]
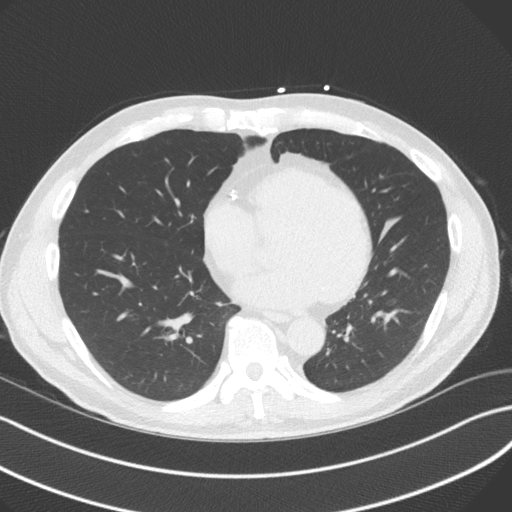
[im 29/44  lung]
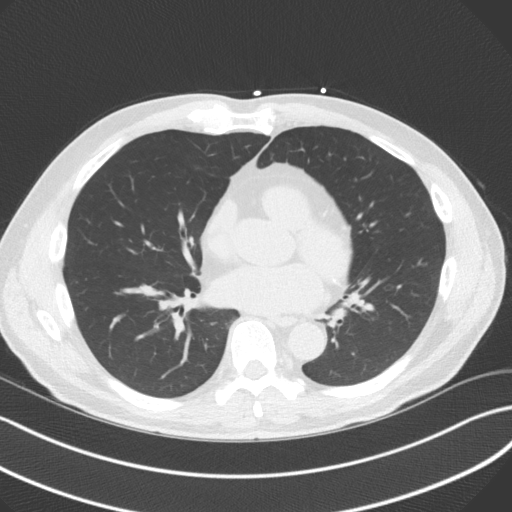
[im 36/44  lung]
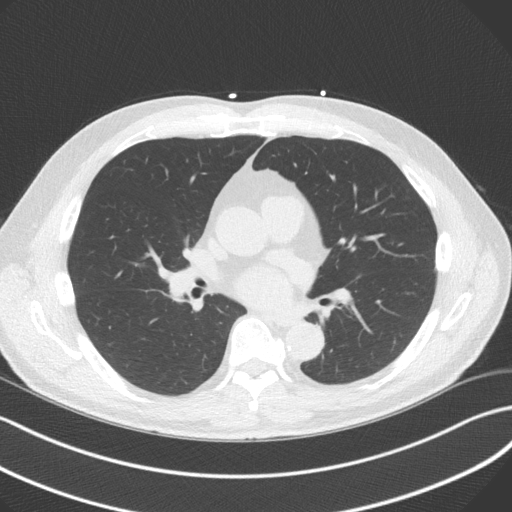

[14 of 20 positions shown; findings below may reference images not displayed]

FINDINGS: Aortic atherosclerosis. Within the visualized portions of the thorax
there are no suspicious appearing pulmonary nodules or masses, there
is no acute consolidative airspace disease, no pleural effusions, no
pneumothorax and no lymphadenopathy. Visualized portions of the
upper abdomen are unremarkable. There are no aggressive appearing
lytic or blastic lesions noted in the visualized portions of the
skeleton.
IMPRESSION: 1.  Aortic Atherosclerosis (HV5JX-NRS.S).
FINDINGS: Non-cardiac: See separate report from [REDACTED].

Ascending Aorta: Normal caliber.  Scattered calcifications.

Pericardium: Normal

Coronary arteries: Normal coronary origins.
IMPRESSION: Coronary calcium score of 4447. This was 97th percentile for age and
sex matched control.

Keifer Bucher

*** End of Addendum ***
EXAM:
OVER-READ INTERPRETATION  CT CHEST

The following report is an over-read performed by radiologist Dr.
Dias Xato Tusamba [REDACTED] on 06/23/2019. This
over-read does not include interpretation of cardiac or coronary
anatomy or pathology. The coronary calcium score interpretation by
the cardiologist is attached.
FINDINGS: Aortic atherosclerosis. Within the visualized portions of the thorax
there are no suspicious appearing pulmonary nodules or masses, there
is no acute consolidative airspace disease, no pleural effusions, no
pneumothorax and no lymphadenopathy. Visualized portions of the
upper abdomen are unremarkable. There are no aggressive appearing
lytic or blastic lesions noted in the visualized portions of the
skeleton.
IMPRESSION: 1.  Aortic Atherosclerosis (HV5JX-NRS.S).

## 2022-07-30 ENCOUNTER — Telehealth: Payer: Medicare Other | Admitting: Nurse Practitioner

## 2022-07-30 ENCOUNTER — Encounter: Payer: Self-pay | Admitting: Internal Medicine

## 2022-07-30 DIAGNOSIS — M62830 Muscle spasm of back: Secondary | ICD-10-CM | POA: Diagnosis not present

## 2022-07-30 MED ORDER — PREDNISONE 10 MG (21) PO TBPK
ORAL_TABLET | ORAL | 0 refills | Status: DC
Start: 2022-07-30 — End: 2022-08-25

## 2022-07-30 NOTE — Progress Notes (Signed)
We are sorry that you are not feeling well.  Here is how we plan to help!  Based on what you have shared with me it looks like you mostly have acute back pain.  Acute back pain is defined as musculoskeletal pain that can resolve in 1-3 weeks with conservative treatment.  I have prescribed  Meds ordered this encounter  Medications   predniSONE (STERAPRED UNI-PAK 21 TAB) 10 MG (21) TBPK tablet    Sig: Take 6 tablets on day one, 5 on day two, 4 on day three, 3 on day four, 2 on day five, and 1 on day six. Take with food.    Dispense:  21 tablet    Refill:  0    Back pain is very common.  The pain often gets better over time.  The cause of back pain is usually not dangerous.  Most people can learn to manage their back pain on their own.  Home Care Stay active.  Start with short walks on flat ground if you can.  Try to walk farther each day. Do not sit, drive or stand in one place for more than 30 minutes.  Do not stay in bed. Do not avoid exercise or work.  Activity can help your back heal faster. Be careful when you bend or lift an object.  Bend at your knees, keep the object close to you, and do not twist. Sleep on a firm mattress.  Lie on your side, and bend your knees.  If you lie on your back, put a pillow under your knees. Only take medicines as told by your doctor. Put ice on the injured area. Put ice in a plastic bag Place a towel between your skin and the bag Leave the ice on for 15-20 minutes, 3-4 times a day for the first 2-3 days. 210 After that, you can switch between ice and heat packs. Ask your doctor about back exercises or massage. Avoid feeling anxious or stressed.  Find good ways to deal with stress, such as exercise.  Get Help Right Way If: Your pain does not go away with rest or medicine. Your pain does not go away in 1 week. You have new problems. You do not feel well. The pain spreads into your legs. You cannot control when you poop (bowel movement) or pee  (urinate) You feel sick to your stomach (nauseous) or throw up (vomit) You have belly (abdominal) pain. You feel like you may pass out (faint). If you develop a fever.  Make Sure you: Understand these instructions. Will watch your condition Will get help right away if you are not doing well or get worse.  Your e-visit answers were reviewed by a board certified advanced clinical practitioner to complete your personal care plan.  Depending on the condition, your plan could have included both over the counter or prescription medications.  If there is a problem please reply  once you have received a response from your provider.  Your safety is important to [place].  If you have drug allergies check your prescription carefully.    You can use MyChart to ask questions about today's visit, request a non-urgent call back, or ask for a work or school excuse for 24 hours related to this e-Visit. If it has been greater than 24 hours you will need to follow up with your provider, or enter a new e-Visit to address those concerns.  You will get an e-mail in the next two days asking about your  experience.  I hope that your e-visit has been valuable and will speed your recovery. Thank you for using e-visits.   Meds ordered this encounter  Medications   predniSONE (STERAPRED UNI-PAK 21 TAB) 10 MG (21) TBPK tablet    Sig: Take 6 tablets on day one, 5 on day two, 4 on day three, 3 on day four, 2 on day five, and 1 on day six. Take with food.    Dispense:  21 tablet    Refill:  0    I spent approximately 5 minutes reviewing the patient's history, current symptoms and coordinating their care today.

## 2022-08-18 ENCOUNTER — Encounter: Payer: Self-pay | Admitting: Internal Medicine

## 2022-08-20 ENCOUNTER — Encounter (INDEPENDENT_AMBULATORY_CARE_PROVIDER_SITE_OTHER): Payer: Self-pay

## 2022-08-20 NOTE — Progress Notes (Signed)
Cardiology Clinic Note   Date: 08/25/2022 ID: Juan [name], DOB 1955/04/15, MRN [id number]  Primary Cardiologist:  Little Ishikawa, MD  Patient Profile    Juan [name] is a 67 y.o. male who presents to the clinic today for routine follow up.     Past medical history significant for: Nonobstructive CAD. CT cardiac scoring 06/23/2019: Calcium score 1716 (97th percentile). Nuclear stress test 07/21/2019: Large sized, moderate intensity fixed inferior/inferior septal perfusion defect with normal wall motion suggestive of artifact or less likely scar.  No significant reversible ischemia.  Intermediate risk given size of defect. Echo 08/03/2019: EF 60 to 65%.  No RWMA.  Normal diastolic parameters.  Normal RV function.  Trivial MR.  Mild aortic valve sclerosis without stenosis. Hypertension. Hyperlipidemia. Lipid panel 05/11/2022: LDL 47, HDL 44, TG 193, total 130.     History of Present Illness    Juan [name] was first evaluated by Dr. Bjorn Pippin on 07/18/2019 for elevated coronary calcium score at the request of Dr. Yetta Barre.  Patient reported occasional central chest tightness that occurs with and without exertion and can last several hours.  He typically did yoga and lifting weights about once a week.  He had been unable to walk secondary to knee pain.  He reported fatigue and dyspnea with exertion.  He worked as a Nutritional therapist.  Family history of father with CABG in early 79s.  He underwent nuclear stress test which showed defect suggestive of artifact.  Echo showed normal LV/RV function with no significant valvular abnormalities.  Patient was last seen in the office by Dr. Bjorn Pippin on 08/28/2021 for routine follow-up.  He complained of mild fatigue but no other symptoms.  No changes were made.  Today, patient is doing well. Patient denies shortness of breath or dyspnea on exertion. No chest pain, pressure, or tightness. Denies lower extremity edema, orthopnea, or PND. No palpitations.  He reports  getting high BP readings at home 140-150/90-95. BP 123/70 today x 2 checks. He reports getting more headaches than usual but not necessarily correlating with high BP readings. He sees his PCP soon and is encouraged to bring his cuff to that visit to see if it correlates with their BP reading. He stays active working as a Nutritional therapist. He used to do a lot of yoga but has not done it in quite some time. He is encouraged to start back slowly and advance as tolerated.     ROS: All other systems reviewed and are otherwise negative except as noted in History of Present Illness.  Studies Reviewed    EKG Interpretation Date/Time:  Tuesday August 25 2022 07:56:35 EDT Ventricular Rate:  64 PR Interval:  204 QRS Duration:  90 QT Interval:  408 QTC Calculation: 420 R Axis:   -7  Text Interpretation: Normal sinus rhythm Normal ECG When compared with ECG of 23-Aug-2019 13:45, PREVIOUS ECG IS PRESENT Confirmed by Carlos Levering [phone]) on 08/25/2022 8:04:37 AM           Physical Exam    VS:  BP 123/70   Pulse 64   Ht 6' (1.829 m)   Wt 192 lb (87.1 kg)   SpO2 95%   BMI 26.04 kg/m  , BMI Body mass index is 26.04 kg/m.  GEN: Well nourished, well developed, in no acute distress. Neck: No JVD or carotid bruits. Cardiac:  RRR. No murmurs. No rubs or gallops.   Respiratory:  Respirations regular and unlabored. Clear to auscultation without rales, wheezing or rhonchi. GI: Soft,  nontender, nondistended. Extremities: Radials/DP/PT 2+ and equal bilaterally. No clubbing or cyanosis. No edema.  Skin: Warm and dry, no rash. Neuro: Strength intact.  Assessment & Plan    Nonobstructive CAD.  Coronary calcium score 1716 June 2021.  Nuclear stress test showed a defect suggestive of artifact.  Echo showed normal LV/RV function with no significant valvular abnormalities. Patient denies chest pain, pressure, tightness.  Continue aspirin, atorvastatin, carvedilol. Hypertension: BP today 123/70. Patient denies  dizziness or vision changes. He has been getting high readings at home 140150/90-95. He will bring his cuff to PCP to correlate readings. Continue carvedilol, losartan. Hyperlipidemia.  LDL May 2024 47, at goal.  Continue atorvastatin.  Disposition: Return in 1 year or sooner as needed.          Signed, Etta Grandchild. Teaira Croft, DNP, NP-C

## 2022-08-25 ENCOUNTER — Encounter: Payer: Self-pay | Admitting: Student

## 2022-08-25 ENCOUNTER — Ambulatory Visit: Payer: Medicare Other | Attending: Student | Admitting: Student

## 2022-08-25 VITALS — BP 123/70 | HR 64 | Ht 72.0 in | Wt 192.0 lb

## 2022-08-25 DIAGNOSIS — I10 Essential (primary) hypertension: Secondary | ICD-10-CM

## 2022-08-25 DIAGNOSIS — I2510 Atherosclerotic heart disease of native coronary artery without angina pectoris: Secondary | ICD-10-CM | POA: Diagnosis not present

## 2022-08-25 DIAGNOSIS — E785 Hyperlipidemia, unspecified: Secondary | ICD-10-CM

## 2022-08-25 NOTE — Patient Instructions (Addendum)
Medication Instructions:  *If you need a refill on your cardiac medications before your next appointment, please call your pharmacy*   Lab Work: If you have labs (blood work) drawn today and your tests are completely normal, you will receive your results only by: MyChart Message (if you have MyChart) OR A paper copy in the mail If you have any lab test that is abnormal or we need to change your treatment, we will call you to review the results.   Follow-Up: At [facility], you and your health needs are our priority.  As part of our continuing mission to provide you with exceptional heart care, we have created designated Provider Care Teams.  These Care Teams include your primary Cardiologist (physician) and Advanced Practice Providers (APPs -  Physician Assistants and Nurse Practitioners) who all work together to provide you with the care you need, when you need it.  We recommend signing up for the patient portal called "MyChart".  Sign up information is provided on this After Visit Summary.  MyChart is used to connect with patients for Virtual Visits (Telemedicine).  Patients are able to view lab/test results, encounter notes, upcoming appointments, etc.  Non-urgent messages can be sent to your provider as well.   To learn more about what you can do with MyChart, go to [email or web address].    Your next appointment:   1 year(s)  Provider:   Little Ishikawa, MD

## 2022-09-17 ENCOUNTER — Other Ambulatory Visit: Payer: Self-pay | Admitting: Internal Medicine

## 2022-09-17 DIAGNOSIS — I10 Essential (primary) hypertension: Secondary | ICD-10-CM

## 2022-09-23 ENCOUNTER — Other Ambulatory Visit: Payer: Self-pay | Admitting: Cardiology

## 2022-09-23 ENCOUNTER — Ambulatory Visit: Payer: Medicare Other | Admitting: Internal Medicine

## 2022-09-23 DIAGNOSIS — Z20822 Contact with and (suspected) exposure to covid-19: Secondary | ICD-10-CM | POA: Diagnosis not present

## 2022-09-23 DIAGNOSIS — R0981 Nasal congestion: Secondary | ICD-10-CM | POA: Diagnosis not present

## 2022-10-01 ENCOUNTER — Other Ambulatory Visit: Payer: Self-pay | Admitting: Internal Medicine

## 2022-10-01 DIAGNOSIS — I10 Essential (primary) hypertension: Secondary | ICD-10-CM

## 2022-11-10 ENCOUNTER — Ambulatory Visit (INDEPENDENT_AMBULATORY_CARE_PROVIDER_SITE_OTHER): Payer: Medicare Other

## 2022-11-10 VITALS — Ht 72.0 in | Wt 192.0 lb

## 2022-11-10 DIAGNOSIS — Z0000 Encounter for general adult medical examination without abnormal findings: Secondary | ICD-10-CM

## 2022-11-10 NOTE — Patient Instructions (Signed)
Mr. Streety , Thank you for taking time to come for your Medicare Wellness Visit. I appreciate your ongoing commitment to your health goals. Please review the following plan we discussed and let me know if I can assist you in the future.   Referrals/Orders/Follow-Ups/Clinician Recommendations: You are due for a Flu and Ci=ovid vaccine.  Remember to ask your provider during your next visit about a eye doctor referral.  Keep up the good work.  This is a list of the screening recommended for you and due dates:  Health Maintenance  Topic Date Due   COVID-19 Vaccine (3 - Pfizer risk series) 01/05/2023*   Flu Shot  04/05/2023*   Medicare Annual Wellness Visit  11/10/2023   DTaP/Tdap/Td vaccine (3 - Td or Tdap) 06/13/2029   Colon Cancer Screening  08/17/2030   Pneumonia Vaccine  Completed   Hepatitis C Screening  Completed   Zoster (Shingles) Vaccine  Completed   HPV Vaccine  Aged Out  *Topic was postponed. The date shown is not the original due date.    Advanced directives: (Copy Requested) Please bring a copy of your health care power of attorney and living will to the office to be added to your chart at your convenience.  Next Medicare Annual Wellness Visit scheduled for next year: Yes

## 2022-11-10 NOTE — Progress Notes (Signed)
Subjective:   Juan [name] is a 67 y.o. male who presents for Medicare Annual/Subsequent preventive examination.  Visit Complete: Virtual I connected with  Juan [name] on 11/10/22 by a audio enabled telemedicine application and verified that I am speaking with the correct person using two identifiers.  Patient Location: Home  Provider Location: Home Office  I discussed the limitations of evaluation and management by telemedicine. The patient expressed understanding and agreed to proceed.  Vital Signs: Because this visit was a virtual/telehealth visit, some criteria may be missing or patient reported. Any vitals not documented were not able to be obtained and vitals that have been documented are patient reported.  Patient Medicare AWV questionnaire was completed by the patient on 11/09/2022; I have confirmed that all information answered by patient is correct and no changes since this date.  Cardiac Risk Factors include: advanced age (>70men, >51 women);male gender;hypertension;dyslipidemia;Other (see comment), Risk factor comments: Coronary atherosclerosis     Objective:    Today's Vitals   11/10/22 0818  Weight: 192 lb (87.1 kg)  Height: 6' (1.829 m)   Body mass index is 26.04 kg/m.     11/10/2022    8:25 AM 12/18/2021    3:37 PM 08/23/2019    1:46 PM 06/13/2013   11:17 AM 05/11/2013    7:55 AM  Advanced Directives  Does Patient Have a Medical Advance Directive? Yes Yes No Patient does not have advance directive Patient does not have advance directive  Type of Advance Directive Healthcare Power of [place];Living will Healthcare Power of [place];Living will     Does patient want to make changes to medical advance directive?  No - Patient declined     Copy of Healthcare Power of Attorney in Chart? No - copy requested No - copy requested     Would patient like information on creating a medical advance directive?   Yes (ED - Information included in AVS)      Current  Medications (verified) Outpatient Encounter Medications as of 11/10/2022  Medication Sig   aspirin EC (ASPIRIN LOW DOSE) 81 MG tablet Take 1 tablet (81 mg total) by mouth daily.   atorvastatin (LIPITOR) 40 MG tablet TAKE 1 TABLET(40 MG) BY MOUTH DAILY   carvedilol (COREG) 3.125 MG tablet TAKE 1 TABLET(3.125 MG) BY MOUTH TWICE DAILY WITH A MEAL   indapamide (LOZOL) 1.25 MG tablet TAKE 1 TABLET(1.25 MG) BY MOUTH DAILY   losartan (COZAAR) 100 MG tablet TAKE 1 TABLET(100 MG) BY MOUTH DAILY   tadalafil (CIALIS) 5 MG tablet TAKE 1 TABLET(5 MG) BY MOUTH DAILY AS NEEDED FOR ERECTILE DYSFUNCTION   testosterone (ANDROGEL) 50 MG/5GM (1%) GEL Place 10 g onto the skin daily.   trazodone (DESYREL) 300 MG tablet Take 1 tablet (300 mg total) by mouth at bedtime.   guaiFENesin (MUCINEX) 600 MG 12 hr tablet Take 1 tablet (600 mg total) by mouth 2 (two) times daily.   No facility-administered encounter medications on file as of 11/10/2022.    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   Family History  Problem Relation Age of Onset   Prostate cancer Father  Heart disease Father    Colon cancer Neg Hx    Esophageal cancer Neg Hx    Pancreatic cancer Neg Hx    Stomach cancer Neg Hx    Social History   Socioeconomic History   Marital status: Married    Spouse name: [name]   Number of children: 1   Years of education: Not on file   Highest education level: 12th grade  Occupational History   Not on file  Tobacco Use   Smoking status: Former    Current packs/day: 0.00    Types: Cigarettes    Quit date: 01/06/1999    Years since quitting: 23.8   Smokeless tobacco: Never   Vaping Use   Vaping status: Never Used  Substance and Sexual Activity   Alcohol use: No   Drug use: No    Comment: 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      Lives with wife.  Has 2 cats.,   Social Determinants of Health   Financial Resource Strain: Low Risk  (11/09/2022)   Overall Financial Resource Strain (CARDIA)    Difficulty of Paying Living Expenses: Not hard at all  Food Insecurity: No Food Insecurity (11/09/2022)   Hunger Vital Sign    Worried About Running Out of Food in the Last Year: Never true    Ran Out of Food in the Last Year: Never true  Transportation Needs: No Transportation Needs (11/09/2022)   PRAPARE - Administrator, Civil Service (Medical): No    Lack of Transportation (Non-Medical): No  Physical Activity: Insufficiently Active (11/09/2022)   Exercise Vital Sign    Days of Exercise per Week: 2 days    Minutes of Exercise per Session: 70 min  Stress: No Stress Concern Present (11/09/2022)   [employer] of Occupational Health - Occupational Stress Questionnaire    Feeling of Stress : Only a little  Social Connections: Moderately Isolated (11/09/2022)   Social Connection and Isolation Panel [NHANES]    Frequency of Communication with Friends and Family: Once a week    Frequency of Social Gatherings with Friends and Family: Once a week    Attends Religious Services: Never    Database administrator or Organizations: Yes    Attends Engineer, structural: More than 4 times per year    Marital Status: Married    Tobacco Counseling Counseling given: Not Answered   Clinical Intake:     Pain : No/denies pain     BMI - recorded: 26.04 Nutritional Status: BMI 25 -29 Overweight Nutritional Risks:  None Diabetes: No  How often do you need to have someone help you when you read instructions, pamphlets, or other written materials from your doctor or pharmacy?: 1 - Never  Interpreter Needed?: No  Information entered by :: Tayllor Breitenstein, RMA   Activities of Daily Living    11/09/2022    7:26 AM 12/18/2021    3:37 PM  In your present state of health, do you have any difficulty performing the following activities:  Hearing? 0 0  Vision? 0 0  Difficulty concentrating or making decisions? 0 0  Walking or climbing stairs? 0 0  Dressing or bathing? 0 0  Doing errands, shopping? 0 0  Preparing Food and eating ? N N  Using the Toilet? N N  In the past six months, have you accidently leaked urine? N N  Do you have problems with loss of bowel control? N N  Managing your Medications? N N  Managing your Finances? N N  Housekeeping or managing your Housekeeping? N N    Patient Care Team: Etta Grandchild, MD as PCP - General Little Ishikawa, MD as PCP - Cardiology (Cardiology)  Indicate any recent Medical Services you may have received from other than [facility] providers in the past year (date may be approximate).     Assessment:   This is a routine wellness examination for Jemal.  Hearing/Vision screen Hearing Screening - Comments:: Denies hearing difficulties   Vision Screening - Comments:: Wears eyeglasses    Goals Addressed             This Visit's Progress    Patient Stated   Not on track    I want to lose weight by decreasing the amount of carbohydrates I eat.       Depression Screen    11/10/2022    8:27 AM 12/18/2021    3:35 PM 09/03/2020    9:21 AM 04/02/2020    7:58 AM 06/14/2019    8:31 AM 01/31/2019    4:09 PM 09/15/2018   10:36 AM  PHQ 2/9 Scores  PHQ - 2 Score 0 0 0 0 0 1 2  PHQ- 9 Score 0  0 0 2 2 5     Fall Risk    11/09/2022    7:26 AM 12/18/2021    3:38 PM 10/23/2021    9:21 AM 09/03/2020    9:20 AM 09/15/2018   10:36 AM  Fall Risk   Falls  in the past year? 0 0 0 0 0  Number falls in past yr: 0 0 0  0  Injury with Fall? 0 0 0  0  Risk for fall due to :  No Fall Risks No Fall Risks    Follow up Falls evaluation completed;Falls prevention discussed Falls evaluation completed Falls evaluation completed  Falls evaluation completed    MEDICARE RISK AT HOME: Medicare Risk at Home Any stairs in or around the home?: Yes If so, are there any without handrails?: No Home free of loose throw rugs in walkways, pet beds, electrical cords, etc?: Yes Adequate lighting in your home to reduce risk of falls?: Yes Life alert?: No Use of a cane, walker or w/c?: No Grab bars in the bathroom?: No Shower chair or bench in shower?: No Elevated toilet seat or a handicapped toilet?: No  TIMED UP AND GO:  Was the test performed?  No    Cognitive Function:        11/10/2022    8:25 AM 12/18/2021    3:38 PM  6CIT Screen  What Year? 0 points 0 points  What month? 0 points 0 points  What time? 0 points 0 points  Count back from 20 0 points 0 points  Months in reverse 0 points 0 points  Repeat phrase 0 points 0 points  Total Score 0 points 0 points    Immunizations Immunization History  Administered Date(s) Administered   Fluad Quad(high Dose 65+) 09/03/2020, 10/23/2021   Influenza Whole 01/13/2010   Influenza,inj,Quad PF,6+ Mos 10/09/2015, 12/21/2016, 02/28/2018, 09/15/2018, 09/12/2019   Influenza-Unspecified 09/21/2012, 03/06/2014, 09/21/2014   PFIZER(Purple Top)SARS-COV-2 Vaccination 03/22/2019, 04/12/2019   PNEUMOCOCCAL CONJUGATE-20 05/12/2021   Td 04/25/2009  Tdap 06/14/2019   Zoster Recombinant(Shingrix) 12/25/2016, 06/14/2019, 11/15/2019    TDAP status: Up to date  Flu Vaccine status: Due, Education has been provided regarding the importance of this vaccine. Advised may receive this vaccine at local pharmacy or Health Dept. Aware to provide a copy of the vaccination record if obtained from local pharmacy or Health Dept.  Verbalized acceptance and understanding.  Pneumococcal vaccine status: Up to date  Covid-19 vaccine status: Information provided on how to obtain vaccines.   Qualifies for Shingles Vaccine? Yes   Zostavax completed Yes   Shingrix Completed?: Yes  Screening Tests Health Maintenance  Topic Date Due   COVID-19 Vaccine (3 - Pfizer risk series) 01/05/2023 (Originally 05/10/2019)   INFLUENZA VACCINE  04/05/2023 (Originally 08/06/2022)   Medicare Annual Wellness (AWV)  11/10/2023   DTaP/Tdap/Td (3 - Td or Tdap) 06/13/2029   Colonoscopy  08/17/2030   Pneumonia Vaccine 69+ Years old  Completed   Hepatitis C Screening  Completed   Zoster Vaccines- Shingrix  Completed   HPV VACCINES  Aged Out    Health Maintenance  There are no preventive care reminders to display for this patient.   Colorectal cancer screening: Type of screening: Colonoscopy. Completed 2022. Repeat every 10 years  Lung Cancer Screening: (Low Dose CT Chest recommended if Age 71-80 years, 20 pack-year currently smoking OR have quit w/in 15years.) does not qualify.   Lung Cancer Screening Referral: N/A  Additional Screening:  Hepatitis C Screening: does qualify; Completed 10/21/2015  Vision Screening: Recommended annual ophthalmology exams for early detection of glaucoma and other disorders of the eye. Is the patient up to date with their annual eye exam?  No  Who is the provider or what is the name of the office in which the patient attends annual eye exams? N/A If pt is not established with a provider, would they like to be referred to a provider to establish care? Yes .   Dental Screening: Recommended annual dental exams for proper oral hygiene   Community Resource Referral / Chronic Care Management: CRR required this visit?  No   CCM required this visit?  No     Plan:     I have personally reviewed and noted the following in the patient's chart:   Medical and social history Use of alcohol, tobacco or  illicit drugs  Current medications and supplements including opioid prescriptions. Patient is not currently taking opioid prescriptions. Functional ability and status Nutritional status Physical activity Advanced directives List of other physicians Hospitalizations, surgeries, and ER visits in previous 12 months Vitals Screenings to include cognitive, depression, and falls Referrals and appointments  In addition, I have reviewed and discussed with patient certain preventive protocols, quality metrics, and best practice recommendations. A written personalized care plan for preventive services as well as general preventive health recommendations were provided to patient.     Dajae Kizer L Tieisha Darden, CMA   11/10/2022   After Visit Summary: (MyChart) Due to this being a telephonic visit, the after visit summary with patients personalized plan was offered to patient via MyChart   Nurse Notes: Patient is due for a Flu and covid vaccine.  He is not up to date with yearly eye exam.  Patient would like to be referred to an eye doctor, during his up coming office visit.  He had no other concerns to address today.

## 2022-11-12 ENCOUNTER — Ambulatory Visit: Payer: Medicare Other | Admitting: Internal Medicine

## 2022-11-12 ENCOUNTER — Encounter: Payer: Self-pay | Admitting: Internal Medicine

## 2022-11-12 VITALS — BP 126/84 | HR 62 | Temp 97.6°F | Resp 16 | Ht 72.0 in | Wt 193.4 lb

## 2022-11-12 DIAGNOSIS — Z23 Encounter for immunization: Secondary | ICD-10-CM | POA: Diagnosis not present

## 2022-11-12 DIAGNOSIS — E291 Testicular hypofunction: Secondary | ICD-10-CM | POA: Diagnosis not present

## 2022-11-12 DIAGNOSIS — I10 Essential (primary) hypertension: Secondary | ICD-10-CM

## 2022-11-12 DIAGNOSIS — N400 Enlarged prostate without lower urinary tract symptoms: Secondary | ICD-10-CM

## 2022-11-12 DIAGNOSIS — R739 Hyperglycemia, unspecified: Secondary | ICD-10-CM

## 2022-11-12 DIAGNOSIS — E785 Hyperlipidemia, unspecified: Secondary | ICD-10-CM | POA: Diagnosis not present

## 2022-11-12 DIAGNOSIS — Z0000 Encounter for general adult medical examination without abnormal findings: Secondary | ICD-10-CM

## 2022-11-12 DIAGNOSIS — Z0001 Encounter for general adult medical examination with abnormal findings: Secondary | ICD-10-CM

## 2022-11-12 LAB — CBC WITH DIFFERENTIAL/PLATELET
Basophils Absolute: 0 10*3/uL (ref 0.0–0.1)
Basophils Relative: 0.9 % (ref 0.0–3.0)
Eosinophils Absolute: 0.2 10*3/uL (ref 0.0–0.7)
Eosinophils Relative: 3.6 % (ref 0.0–5.0)
HCT: 46.9 % (ref 39.0–52.0)
Hemoglobin: 15.5 g/dL (ref 13.0–17.0)
Lymphocytes Relative: 19.5 % (ref 12.0–46.0)
Lymphs Abs: 1 10*3/uL (ref 0.7–4.0)
MCHC: 33 g/dL (ref 30.0–36.0)
MCV: 89.8 fL (ref 78.0–100.0)
Monocytes Absolute: 0.5 10*3/uL (ref 0.1–1.0)
Monocytes Relative: 10.2 % (ref 3.0–12.0)
Neutro Abs: 3.4 10*3/uL (ref 1.4–7.7)
Neutrophils Relative %: 65.8 % (ref 43.0–77.0)
Platelets: 324 10*3/uL (ref 150.0–400.0)
RBC: 5.22 Mil/uL (ref 4.22–5.81)
RDW: 14.4 % (ref 11.5–15.5)
WBC: 5.1 10*3/uL (ref 4.0–10.5)

## 2022-11-12 LAB — URINALYSIS, ROUTINE W REFLEX MICROSCOPIC
Bilirubin Urine: NEGATIVE
Hgb urine dipstick: NEGATIVE
Ketones, ur: NEGATIVE
Leukocytes,Ua: NEGATIVE
Nitrite: NEGATIVE
Specific Gravity, Urine: 1.015 (ref 1.000–1.030)
Total Protein, Urine: NEGATIVE
Urine Glucose: NEGATIVE
Urobilinogen, UA: 0.2 (ref 0.0–1.0)
pH: 7 (ref 5.0–8.0)

## 2022-11-12 LAB — BASIC METABOLIC PANEL
BUN: 21 mg/dL (ref 6–23)
CO2: 28 meq/L (ref 19–32)
Calcium: 9.9 mg/dL (ref 8.4–10.5)
Chloride: 102 meq/L (ref 96–112)
Creatinine, Ser: 1.03 mg/dL (ref 0.40–1.50)
GFR: 75.15 mL/min (ref >60.00)
Glucose, Bld: 89 mg/dL (ref 70–99)
Potassium: 4.2 meq/L (ref 3.5–5.1)
Sodium: 138 meq/L (ref 135–145)

## 2022-11-12 LAB — HEMOGLOBIN A1C: Hgb A1c MFr Bld: 5.8 % (ref 4.6–6.5)

## 2022-11-12 LAB — TSH: TSH: 1.13 u[IU]/mL (ref 0.35–5.50)

## 2022-11-12 LAB — PSA: PSA: 0.25 ng/mL (ref 0.10–4.00)

## 2022-11-12 NOTE — Patient Instructions (Signed)
Health Maintenance, Male Adopting a healthy lifestyle and getting preventive care are important in promoting health and wellness. Ask your health care provider about: The right schedule for you to have regular tests and exams. Things you can do on your own to prevent diseases and keep yourself healthy. What should I know about diet, weight, and exercise? Eat a healthy diet  Eat a diet that includes plenty of vegetables, fruits, low-fat dairy products, and lean protein. Do not eat a lot of foods that are high in solid fats, added sugars, or sodium. Maintain a healthy weight Body mass index (BMI) is a measurement that can be used to identify possible weight problems. It estimates body fat based on height and weight. Your health care provider can help determine your BMI and help you achieve or maintain a healthy weight. Get regular exercise Get regular exercise. This is one of the most important things you can do for your health. Most adults should: Exercise for at least 150 minutes each week. The exercise should increase your heart rate and make you sweat (moderate-intensity exercise). Do strengthening exercises at least twice a week. This is in addition to the moderate-intensity exercise. Spend less time sitting. Even light physical activity can be beneficial. Watch cholesterol and blood lipids Have your blood tested for lipids and cholesterol at 67 years of age, then have this test every 5 years. You may need to have your cholesterol levels checked more often if: Your lipid or cholesterol levels are high. You are older than 67 years of age. You are at high risk for heart disease. What should I know about cancer screening? Many types of cancers can be detected early and may often be prevented. Depending on your health history and family history, you may need to have cancer screening at various ages. This may include screening for: Colorectal cancer. Prostate cancer. Skin cancer. Lung  cancer. What should I know about heart disease, diabetes, and high blood pressure? Blood pressure and heart disease High blood pressure causes heart disease and increases the risk of stroke. This is more likely to develop in people who have high blood pressure readings or are overweight. Talk with your health care provider about your target blood pressure readings. Have your blood pressure checked: Every 3-5 years if you are 18-39 years of age. Every year if you are 40 years old or older. If you are between the ages of 65 and 75 and are a current or former smoker, ask your health care provider if you should have a one-time screening for abdominal aortic aneurysm (AAA). Diabetes Have regular diabetes screenings. This checks your fasting blood sugar level. Have the screening done: Once every three years after age 45 if you are at a normal weight and have a low risk for diabetes. More often and at a younger age if you are overweight or have a high risk for diabetes. What should I know about preventing infection? Hepatitis B If you have a higher risk for hepatitis B, you should be screened for this virus. Talk with your health care provider to find out if you are at risk for hepatitis B infection. Hepatitis C Blood testing is recommended for: Everyone born from 1945 through 1965. Anyone with known risk factors for hepatitis C. Sexually transmitted infections (STIs) You should be screened each year for STIs, including gonorrhea and chlamydia, if: You are sexually active and are younger than 67 years of age. You are older than 67 years of age and your   health care provider tells you that you are at risk for this type of infection. Your sexual activity has changed since you were last screened, and you are at increased risk for chlamydia or gonorrhea. Ask your health care provider if you are at risk. Ask your health care provider about whether you are at high risk for HIV. Your health care provider  may recommend a prescription medicine to help prevent HIV infection. If you choose to take medicine to prevent HIV, you should first get tested for HIV. You should then be tested every 3 months for as long as you are taking the medicine. Follow these instructions at home: Alcohol use Do not drink alcohol if your health care provider tells you not to drink. If you drink alcohol: Limit how much you have to 0-2 drinks a day. Know how much alcohol is in your drink. In the [place] one drink equals one 12 oz bottle of beer (355 mL), one 5 oz glass of wine (148 mL), or one 1 oz glass of hard liquor (44 mL). Lifestyle Do not use any products that contain nicotine or tobacco. These products include cigarettes, chewing tobacco, and vaping devices, such as e-cigarettes. If you need help quitting, ask your health care provider. Do not use street drugs. Do not share needles. Ask your health care provider for help if you need support or information about quitting drugs. General instructions Schedule regular health, dental, and eye exams. Stay current with your vaccines. Tell your health care provider if: You often feel depressed. You have ever been abused or do not feel safe at home. Summary Adopting a healthy lifestyle and getting preventive care are important in promoting health and wellness. Follow your health care provider's instructions about healthy diet, exercising, and getting tested or screened for diseases. Follow your health care provider's instructions on monitoring your cholesterol and blood pressure. This information is not intended to replace advice given to you by your health care provider. Make sure you discuss any questions you have with your health care provider. Document Revised: 05/13/2020 Document Reviewed: 05/13/2020 Elsevier Patient Education  2024 [employer]  

## 2022-11-12 NOTE — Progress Notes (Signed)
Subjective:  Patient ID: Juan [name], male    DOB: 10/29/1955  Age: 67 y.o. MRN: [id number]  CC: Annual Exam, Hypertension, and Hyperlipidemia   HPI Juan [name] presents for a CPX and f/up -   Discussed the use of AI scribe software for clinical note transcription with the patient, who gave verbal consent to proceed.  History of Present Illness   The patient, with a history of hypertension and cardiovascular disease, reports no recent cardiovascular symptoms such as chest pain, shortness of breath, or dizziness. They are unsure about their blood pressure status but believe it to be okay. Recent readings have shown a decrease in blood pressure, with the most recent being 126/84. The patient denies any changes in cardiovascular conditioning or lifestyle that might account for this decrease.  The patient is currently on a regimen of losartan, indapamide, and carvedilol, with a heart rate of 60. They deny any muscle or joint aches and report feeling generally well. They also report no recent issues with hemorrhoid bleeding following a successful procedure.  The patient acknowledges they are not up to date on their COVID-19 vaccination but plans to schedule it. They also report seasonal allergies, which have been less severe in recent years. They deny any symptoms suggestive of a brain tumor such as headache, blurred vision, visual disturbance, numbness, weakness, tingling, or slurred speech.  The patient denies any abdominal pain or issues with bowel movements, reporting regular bowel habits. They express a desire to lose about 15 pounds but are unsure how to achieve this. They also report no pain during the abdominal examination.       Outpatient Medications Prior to Visit  Medication Sig Dispense Refill   aspirin EC (ASPIRIN LOW DOSE) 81 MG tablet Take 1 tablet (81 mg total) by mouth daily. 90 tablet 1   atorvastatin (LIPITOR) 40 MG tablet TAKE 1 TABLET(40 MG) BY MOUTH DAILY 90 tablet 4    carvedilol (COREG) 3.125 MG tablet TAKE 1 TABLET(3.125 MG) BY MOUTH TWICE DAILY WITH A MEAL 180 tablet 0   indapamide (LOZOL) 1.25 MG tablet TAKE 1 TABLET(1.25 MG) BY MOUTH DAILY 90 tablet 0   losartan (COZAAR) 100 MG tablet TAKE 1 TABLET(100 MG) BY MOUTH DAILY 90 tablet 0   tadalafil (CIALIS) 5 MG tablet TAKE 1 TABLET(5 MG) BY MOUTH DAILY AS NEEDED FOR ERECTILE DYSFUNCTION 90 tablet 1   trazodone (DESYREL) 300 MG tablet Take 1 tablet (300 mg total) by mouth at bedtime. 90 tablet 1   testosterone (ANDROGEL) 50 MG/5GM (1%) GEL Place 10 g onto the skin daily. 900 g 0   guaiFENesin (MUCINEX) 600 MG 12 hr tablet Take 1 tablet (600 mg total) by mouth 2 (two) times daily. 30 tablet 0   No facility-administered medications prior to visit.    ROS Review of Systems  Constitutional: Negative.  Negative for diaphoresis and fatigue.  HENT: Negative.    Eyes: Negative.   Respiratory:  Negative for cough, chest tightness, shortness of breath and wheezing.   Cardiovascular:  Negative for chest pain, palpitations and leg swelling.  Gastrointestinal:  Negative for abdominal pain, blood in stool, constipation, diarrhea, nausea and vomiting.  Endocrine: Negative.   Genitourinary: Negative.   Musculoskeletal:  Negative for arthralgias and myalgias.  Skin: Negative.   Neurological: Negative.  Negative for dizziness, weakness and light-headedness.  Hematological:  Negative for adenopathy. Does not bruise/bleed easily.  Psychiatric/Behavioral: Negative.      Objective:  BP 126/84 (BP Location: Left Arm, Patient  Position: Sitting, Cuff Size: Normal)   Pulse 62   Temp 97.6 F (36.4 C) (Oral)   Resp 16   Ht 6' (1.829 m)   Wt 193 lb 6.4 oz (87.7 kg)   SpO2 97%   BMI 26.23 kg/m   BP Readings from Last 3 Encounters:  11/12/22 126/84  08/25/22 123/70  05/11/22 136/78    Wt Readings from Last 3 Encounters:  11/12/22 193 lb 6.4 oz (87.7 kg)  11/10/22 192 lb (87.1 kg)  08/25/22 192 lb (87.1 kg)     Physical Exam Vitals reviewed.  Constitutional:      Appearance: Normal appearance.  HENT:     Mouth/Throat:     Mouth: Mucous membranes are moist.  Eyes:     General: No scleral icterus.    Conjunctiva/sclera: Conjunctivae normal.  Cardiovascular:     Rate and Rhythm: Normal rate and regular rhythm.     Heart sounds: No murmur heard.    No friction rub. No gallop.  Pulmonary:     Effort: Pulmonary effort is normal.     Breath sounds: No stridor. No wheezing, rhonchi or rales.  Abdominal:     General: Abdomen is flat.     Palpations: There is no mass.     Tenderness: There is no abdominal tenderness. There is no guarding.     Hernia: No hernia is present.  Musculoskeletal:        General: Normal range of motion.     Cervical back: Neck supple.     Right lower leg: No edema.     Left lower leg: No edema.  Lymphadenopathy:     Cervical: No cervical adenopathy.  Skin:    General: Skin is warm and dry.     Coloration: Skin is not pale.     Findings: No rash.  Neurological:     General: No focal deficit present.     Mental Status: He is alert. Mental status is at baseline.  Psychiatric:        Mood and Affect: Mood normal.        Behavior: Behavior normal.     Lab Results  Component Value Date   WBC 5.1 11/12/2022   HGB 15.5 11/12/2022   HCT 46.9 11/12/2022   PLT 324.0 11/12/2022   GLUCOSE 89 11/12/2022   CHOL 130 05/11/2022   TRIG 193.0 (H) 05/11/2022   HDL 44.30 05/11/2022   LDLDIRECT 98.0 02/28/2018   LDLCALC 47 05/11/2022   ALT 24 05/11/2022   AST 17 05/11/2022   NA 138 11/12/2022   K 4.2 11/12/2022   CL 102 11/12/2022   CREATININE 1.03 11/12/2022   BUN 21 11/12/2022   CO2 28 11/12/2022   TSH 1.13 11/12/2022   PSA 0.25 11/12/2022   INR 1.0 09/12/2019   HGBA1C 5.8 11/12/2022    No results found.  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 Will decrease the T dose. -     CBC with Differential/Platelet; Future -     Testosterone  Total,Free,Bio, Males; Future -     Testosterone; Place 5 g onto the skin daily.  Dispense: 450 g; Refill: 1  Essential hypertension- His BP is well controlled. -     TSH; Future -     Urinalysis, Routine w reflex microscopic; Future -     CBC with Differential/Platelet; Future -     Basic metabolic panel; Future  Benign prostatic hyperplasia without lower urinary tract symptoms -  PSA; Future -     Urinalysis, Routine w reflex microscopic; Future  Chronic hyperglycemia- A1C is normal. -     Hemoglobin A1c; Future -     Basic metabolic panel; Future  Hyperlipidemia LDL goal <130 - LDL goal achieved. Doing well on the statin  -     TSH; Future  Need for immunization against influenza -     Flu Vaccine Trivalent High Dose (Fluad)  Encounter for general adult medical examination with abnormal findings - Exam completed, labs reviewed, vaccines reviewed and updated, cancer screenings addressed, pt ed material was given.      Follow-up: Return in about 6 months (around 05/12/2023).  Sanda Linger, MD

## 2022-11-13 ENCOUNTER — Encounter: Payer: Self-pay | Admitting: Internal Medicine

## 2022-11-13 DIAGNOSIS — Z0001 Encounter for general adult medical examination with abnormal findings: Secondary | ICD-10-CM | POA: Insufficient documentation

## 2022-11-13 DIAGNOSIS — Z23 Encounter for immunization: Secondary | ICD-10-CM | POA: Insufficient documentation

## 2022-11-13 LAB — TESTOSTERONE TOTAL,FREE,BIO, MALES
Albumin: 4.4 g/dL (ref 3.6–5.1)
Sex Hormone Binding: 42 nmol/L (ref 22–77)
Testosterone, Bioavailable: 582.6 ng/dL — ABNORMAL HIGH (ref 110.0–575.0)
Testosterone, Free: 289.4 pg/mL — ABNORMAL HIGH (ref 46.0–224.0)
Testosterone: 1796 ng/dL — ABNORMAL HIGH (ref 250–827)

## 2022-11-13 MED ORDER — TESTOSTERONE 50 MG/5GM (1%) TD GEL: 5.0000 g | Freq: Every day | TRANSDERMAL | 1 refills | Status: DC

## 2022-12-10 ENCOUNTER — Other Ambulatory Visit: Payer: Self-pay | Admitting: Internal Medicine

## 2022-12-10 DIAGNOSIS — I10 Essential (primary) hypertension: Secondary | ICD-10-CM

## 2023-01-22 ENCOUNTER — Other Ambulatory Visit: Payer: Self-pay | Admitting: Internal Medicine

## 2023-01-22 DIAGNOSIS — I2510 Atherosclerotic heart disease of native coronary artery without angina pectoris: Secondary | ICD-10-CM

## 2023-01-24 ENCOUNTER — Other Ambulatory Visit: Payer: Self-pay | Admitting: Internal Medicine

## 2023-01-24 DIAGNOSIS — I10 Essential (primary) hypertension: Secondary | ICD-10-CM

## 2023-02-18 ENCOUNTER — Other Ambulatory Visit: Payer: Self-pay | Admitting: Internal Medicine

## 2023-02-18 DIAGNOSIS — F5104 Psychophysiologic insomnia: Secondary | ICD-10-CM

## 2023-02-18 DIAGNOSIS — F320 Major depressive disorder, single episode, mild: Secondary | ICD-10-CM

## 2023-02-25 ENCOUNTER — Other Ambulatory Visit: Payer: Self-pay | Admitting: Internal Medicine

## 2023-02-25 DIAGNOSIS — F5104 Psychophysiologic insomnia: Secondary | ICD-10-CM

## 2023-02-25 DIAGNOSIS — F320 Major depressive disorder, single episode, mild: Secondary | ICD-10-CM

## 2023-03-25 ENCOUNTER — Other Ambulatory Visit: Payer: Self-pay | Admitting: Internal Medicine

## 2023-03-25 DIAGNOSIS — I10 Essential (primary) hypertension: Secondary | ICD-10-CM

## 2023-04-01 ENCOUNTER — Other Ambulatory Visit: Payer: Self-pay | Admitting: Internal Medicine

## 2023-04-01 DIAGNOSIS — I10 Essential (primary) hypertension: Secondary | ICD-10-CM

## 2023-04-05 ENCOUNTER — Encounter: Payer: Self-pay | Admitting: Internal Medicine

## 2023-04-12 ENCOUNTER — Other Ambulatory Visit: Payer: Self-pay | Admitting: Internal Medicine

## 2023-04-12 DIAGNOSIS — I10 Essential (primary) hypertension: Secondary | ICD-10-CM

## 2023-04-12 MED ORDER — LOSARTAN POTASSIUM 100 MG PO TABS
ORAL_TABLET | ORAL | 0 refills | Status: DC
Start: 2023-04-12 — End: 2023-07-01

## 2023-05-04 ENCOUNTER — Other Ambulatory Visit: Payer: Self-pay

## 2023-05-06 ENCOUNTER — Encounter: Payer: Self-pay | Admitting: Internal Medicine

## 2023-05-06 ENCOUNTER — Ambulatory Visit (INDEPENDENT_AMBULATORY_CARE_PROVIDER_SITE_OTHER): Admitting: Internal Medicine

## 2023-05-06 VITALS — BP 128/80 | HR 54 | Temp 97.9°F | Resp 16 | Ht 72.0 in | Wt 197.0 lb

## 2023-05-06 DIAGNOSIS — T733XXA Exhaustion due to excessive exertion, initial encounter: Secondary | ICD-10-CM | POA: Insufficient documentation

## 2023-05-06 DIAGNOSIS — E785 Hyperlipidemia, unspecified: Secondary | ICD-10-CM

## 2023-05-06 DIAGNOSIS — R0609 Other forms of dyspnea: Secondary | ICD-10-CM | POA: Insufficient documentation

## 2023-05-06 DIAGNOSIS — E781 Pure hyperglyceridemia: Secondary | ICD-10-CM | POA: Diagnosis not present

## 2023-05-06 DIAGNOSIS — R931 Abnormal findings on diagnostic imaging of heart and coronary circulation: Secondary | ICD-10-CM | POA: Diagnosis not present

## 2023-05-06 DIAGNOSIS — E291 Testicular hypofunction: Secondary | ICD-10-CM

## 2023-05-06 DIAGNOSIS — M170 Bilateral primary osteoarthritis of knee: Secondary | ICD-10-CM | POA: Diagnosis not present

## 2023-05-06 DIAGNOSIS — I10 Essential (primary) hypertension: Secondary | ICD-10-CM

## 2023-05-06 DIAGNOSIS — R001 Bradycardia, unspecified: Secondary | ICD-10-CM | POA: Insufficient documentation

## 2023-05-06 LAB — HEPATIC FUNCTION PANEL
ALT: 20 U/L (ref 0–53)
AST: 19 U/L (ref 0–37)
Albumin: 4.3 g/dL (ref 3.5–5.2)
Alkaline Phosphatase: 25 U/L — ABNORMAL LOW (ref 39–117)
Bilirubin, Direct: 0.1 mg/dL (ref 0.0–0.3)
Total Bilirubin: 0.6 mg/dL (ref 0.2–1.2)
Total Protein: 6.8 g/dL (ref 6.0–8.3)

## 2023-05-06 LAB — URINALYSIS, ROUTINE W REFLEX MICROSCOPIC
Bilirubin Urine: NEGATIVE
Hgb urine dipstick: NEGATIVE
Ketones, ur: NEGATIVE
Leukocytes,Ua: NEGATIVE
Nitrite: NEGATIVE
Specific Gravity, Urine: 1.01 (ref 1.000–1.030)
Total Protein, Urine: NEGATIVE
Urine Glucose: NEGATIVE
Urobilinogen, UA: 1 (ref 0.0–1.0)
pH: 6 (ref 5.0–8.0)

## 2023-05-06 LAB — CBC WITH DIFFERENTIAL/PLATELET
Basophils Absolute: 0 10*3/uL (ref 0.0–0.1)
Basophils Relative: 0.9 % (ref 0.0–3.0)
Eosinophils Absolute: 0.2 10*3/uL (ref 0.0–0.7)
Eosinophils Relative: 4.1 % (ref 0.0–5.0)
HCT: 44.8 % (ref 39.0–52.0)
Hemoglobin: 15 g/dL (ref 13.0–17.0)
Lymphocytes Relative: 22.6 % (ref 12.0–46.0)
Lymphs Abs: 1.2 10*3/uL (ref 0.7–4.0)
MCHC: 33.6 g/dL (ref 30.0–36.0)
MCV: 89 fl (ref 78.0–100.0)
Monocytes Absolute: 0.5 10*3/uL (ref 0.1–1.0)
Monocytes Relative: 9.2 % (ref 3.0–12.0)
Neutro Abs: 3.3 10*3/uL (ref 1.4–7.7)
Neutrophils Relative %: 63.2 % (ref 43.0–77.0)
Platelets: 299 10*3/uL (ref 150.0–400.0)
RBC: 5.04 Mil/uL (ref 4.22–5.81)
RDW: 13.7 % (ref 11.5–15.5)
WBC: 5.2 10*3/uL (ref 4.0–10.5)

## 2023-05-06 LAB — BASIC METABOLIC PANEL WITH GFR
BUN: 16 mg/dL (ref 6–23)
CO2: 29 meq/L (ref 19–32)
Calcium: 9.2 mg/dL (ref 8.4–10.5)
Chloride: 101 meq/L (ref 96–112)
Creatinine, Ser: 0.97 mg/dL (ref 0.40–1.50)
GFR: 80.49 mL/min (ref >60.00)
Glucose, Bld: 92 mg/dL (ref 70–99)
Potassium: 3.8 meq/L (ref 3.5–5.1)
Sodium: 137 meq/L (ref 135–145)

## 2023-05-06 LAB — BRAIN NATRIURETIC PEPTIDE: Pro B Natriuretic peptide (BNP): 65 pg/mL (ref 0.0–100.0)

## 2023-05-06 LAB — LIPID PANEL
Cholesterol: 118 mg/dL (ref 0–200)
HDL: 42 mg/dL (ref >39.00)
LDL Cholesterol: 51 mg/dL (ref 0–99)
NonHDL: 76.37
Total CHOL/HDL Ratio: 3
Triglycerides: 128 mg/dL (ref 0.0–149.0)
VLDL: 25.6 mg/dL (ref 0.0–40.0)

## 2023-05-06 LAB — TROPONIN I (HIGH SENSITIVITY): High Sens Troponin I: 4 ng/L (ref 2–17)

## 2023-05-06 MED ORDER — MELOXICAM 7.5 MG PO TABS: 7.5000 mg | ORAL_TABLET | Freq: Every day | ORAL | 0 refills | Status: DC

## 2023-05-06 NOTE — Patient Instructions (Signed)
 Bradycardia, Adult Bradycardia is a slower-than-normal heartbeat. A normal resting heart rate for an adult ranges from 60 to 100 beats per minute. With bradycardia, the resting heart rate is less than 60 beats per minute. Bradycardia can prevent enough oxygen from reaching certain areas of your body when you are active. It can be serious if it keeps enough oxygen from reaching your brain and other parts of your body. Bradycardia is not a problem for everyone. For some healthy adults, a slow resting heart rate is normal. What are the causes? This condition may be caused by: A problem with the heart, including: A problem with the heart's electrical system, such as a heart block. With a heart block, electrical signals between the chambers of the heart are partially or completely blocked, so they are not able to work as they should. A problem with the heart's natural pacemaker (sinus node). Heart disease. A heart attack. Heart damage. Lyme disease. A heart infection. A heart condition that is present at birth (congenital heart defect). Certain medicines that treat heart conditions. Certain conditions, such as hypothyroidism and obstructive sleep apnea. Problems with the balance of chemicals and other substances, like potassium, in the blood. Trauma. Radiation therapy. What increases the risk? You are more likely to develop this condition if you: Are age 51 or older. Have high blood pressure (hypertension), high cholesterol (hyperlipidemia), or diabetes. Drink heavily, use tobacco or nicotine products, or use drugs. What are the signs or symptoms? Symptoms of this condition include: Light-headedness. Feeling faint or fainting. Fatigue and weakness. Trouble with activity or exercise. Shortness of breath. Chest pain (angina). Drowsiness. Confusion. Dizziness. How is this diagnosed? This condition may be diagnosed based on: Your symptoms. Your medical history. A physical exam. During  the exam, your health care provider will listen to your heartbeat and check your pulse. To confirm the diagnosis, your health care provider may order tests, such as: Blood tests. An electrocardiogram (ECG). This test records the heart's electrical activity. The test can show how fast your heart is beating and whether the heartbeat is steady. A test in which you wear a portable device (event recorder or Holter monitor) to record your heart's electrical activity while you go about your day. An exercise test. How is this treated? Treatment for this condition depends on the cause of the condition and how severe your symptoms are. Treatment may involve: Treatment of the underlying condition. Changing your medicines or how much medicine you take. Having a small, battery-operated device called a pacemaker implanted under the skin. When bradycardia occurs, this device can be used to increase your heart rate and help your heart beat in a regular rhythm. Follow these instructions at home: Lifestyle Manage any health conditions that contribute to bradycardia as told by your health care provider. Follow a heart-healthy diet. A nutrition specialist (dietitian) can help educate you about healthy food options and changes. Follow an exercise program that is approved by your health care provider. Maintain a healthy weight. Try to reduce or manage your stress, such as with yoga or meditation. If you need help reducing stress, ask your health care provider. Do not use any products that contain nicotine or tobacco. These products include cigarettes, chewing tobacco, and vaping devices, such as e-cigarettes. If you need help quitting, ask your health care provider. Do not use illegal drugs. Alcohol use If you drink alcohol: Limit how much you have to: 0-1 drink a day for women who are not pregnant. 0-2 drinks a day  for men. Know how much alcohol is in a drink. In the [place] one drink equals one 12 oz bottle of  beer (355 mL), one 5 oz glass of wine (148 mL), or one 1 oz glass of hard liquor (44 mL). General instructions Take over-the-counter and prescription medicines only as told by your health care provider. Keep all follow-up visits. This is important. How is this prevented? In some cases, bradycardia may be prevented by: Treating underlying medical problems. Stopping behaviors or medicines that can trigger the condition. Contact a health care provider if: You feel light-headed or dizzy. You almost faint. You feel weak or are easily fatigued during physical activity. You experience confusion or have memory problems. Get help right away if: You faint. You have chest pains or an irregular heartbeat (palpitations). You have trouble breathing. These symptoms may represent a serious problem that is an emergency. Do not wait to see if the symptoms will go away. Get medical help right away. Call your local emergency services (911 in the [place] Do not drive yourself to the hospital. Summary Bradycardia is a slower-than-normal heartbeat. With bradycardia, the resting heart rate is less than 60 beats per minute. Treatment for this condition depends on the cause. Manage any health conditions that contribute to bradycardia as told by your health care provider. Do not use any products that contain nicotine or tobacco. These products include cigarettes, chewing tobacco, and vaping devices, such as e-cigarettes. Keep all follow-up visits. This is important. This information is not intended to replace advice given to you by your health care provider. Make sure you discuss any questions you have with your health care provider. Document Revised: 04/14/2020 Document Reviewed: 04/14/2020 Elsevier Patient Education  2024 [employer].

## 2023-05-06 NOTE — Progress Notes (Signed)
 Subjective:  Patient ID: Juan [name], male    DOB: 01/27/1955  Age: 68 y.o. MRN: [id number]  CC: Medical Management of Chronic Issues (Patient states that he's been very fatigue lately, Left Knee achy ), Hypertension, and Hyperlipidemia   HPI Juan [name] presents for f/up -----  Discussed the use of AI scribe software for clinical note transcription with the patient, who gave verbal consent to proceed.  History of Present Illness   Juan [name] is a 68 year old male who presents with fatigue and shortness of breath on exertion.  He has been experiencing significant fatigue over the past several months. No dizziness, lightheadedness, cold sweats, or episodes of feeling on the verge of passing out. Occasional shortness of breath occurs primarily with activity, but there is no associated chest pain.  He reports left knee pain, described as aching. He recalls a previous knee replacement and mentions that arthritis was noted at that time. He is not currently taking any medication for the knee pain and has not started injections for this knee, although he had injections in the past for the other knee prior to replacement.  He continues to use AndroGel , a testosterone  replacement therapy.  He quit smoking over ten years ago after having smoked from the age of 37.       Outpatient Medications Prior to Visit  Medication Sig Dispense Refill   ASPIRIN  LOW DOSE 81 MG tablet TAKE 1 TABLET(81 MG) BY MOUTH DAILY 90 tablet 1   atorvastatin  (LIPITOR) 40 MG tablet TAKE 1 TABLET(40 MG) BY MOUTH DAILY 90 tablet 4   carvedilol  (COREG ) 3.125 MG tablet TAKE 1 TABLET(3.125 MG) BY MOUTH TWICE DAILY WITH A MEAL 180 tablet 0   indapamide  (LOZOL ) 1.25 MG tablet TAKE 1 TABLET(1.25 MG) BY MOUTH DAILY 90 tablet 0   losartan  (COZAAR ) 100 MG tablet TAKE 1 TABLET(100 MG) BY MOUTH DAILY 90 tablet 0   tadalafil  (CIALIS ) 5 MG tablet TAKE 1 TABLET(5 MG) BY MOUTH DAILY AS NEEDED FOR ERECTILE DYSFUNCTION 90 tablet 1    testosterone  (ANDROGEL ) 50 MG/5GM (1%) GEL Place 5 g onto the skin daily. 450 g 1   trazodone  (DESYREL ) 300 MG tablet TAKE 1 TABLET(300 MG) BY MOUTH AT BEDTIME 90 tablet 0   No facility-administered medications prior to visit.    ROS Review of Systems  Constitutional:  Positive for fatigue. Negative for appetite change, chills, diaphoresis and unexpected weight change.  Respiratory:  Positive for shortness of breath. Negative for cough, chest tightness and stridor.   Cardiovascular:  Negative for chest pain, palpitations and leg swelling.  Gastrointestinal: Negative.  Negative for abdominal pain, constipation, diarrhea, nausea and vomiting.  Genitourinary: Negative.  Negative for difficulty urinating.  Musculoskeletal:  Positive for arthralgias. Negative for joint swelling and myalgias.  Skin:  Negative for color change and rash.  Neurological:  Negative for dizziness, weakness and light-headedness.  Hematological:  Negative for adenopathy. Does not bruise/bleed easily.  Psychiatric/Behavioral: Negative.      Objective:  BP 128/80 (BP Location: Left Arm, Patient Position: Sitting, Cuff Size: Normal)   Pulse (!) 54   Temp 97.9 F (36.6 C) (Oral)   Resp 16   Ht 6' (1.829 m)   Wt 197 lb (89.4 kg)   SpO2 96%   BMI 26.72 kg/m   BP Readings from Last 3 Encounters:  05/06/23 128/80  11/12/22 126/84  08/25/22 123/70    Wt Readings from Last 3 Encounters:  05/06/23 197 lb (89.4 kg)  11/12/22  193 lb 6.4 oz (87.7 kg)  11/10/22 192 lb (87.1 kg)    Physical Exam Vitals reviewed.  Constitutional:      Appearance: Normal appearance.  HENT:     Nose: Nose normal.     Mouth/Throat:     Mouth: Mucous membranes are moist.  Eyes:     General: No scleral icterus. Cardiovascular:     Rate and Rhythm: Bradycardia present.     Heart sounds: Heart sounds are distant. No murmur heard.    No gallop.     Comments: EKG --- SB with 1st degree AV block, 53 bpm No LVH, Q waves, or ST/T  wave changes  Unchanged  Pulmonary:     Effort: Pulmonary effort is normal.     Breath sounds: No stridor. No wheezing, rhonchi or rales.  Abdominal:     General: Abdomen is flat.     Palpations: There is no mass.     Tenderness: There is no abdominal tenderness. There is no guarding.     Hernia: No hernia is present.  Musculoskeletal:        General: Deformity (DJD) present. No swelling or tenderness.     Cervical back: Neck supple.     Right lower leg: No edema.     Left lower leg: No edema.  Skin:    General: Skin is warm and dry.  Neurological:     General: No focal deficit present.     Mental Status: He is alert. Mental status is at baseline.  Psychiatric:        Mood and Affect: Mood normal.        Behavior: Behavior normal.     Lab Results  Component Value Date   WBC 5.2 05/06/2023   HGB 15.0 05/06/2023   HCT 44.8 05/06/2023   PLT 299.0 05/06/2023   GLUCOSE 92 05/06/2023   CHOL 118 05/06/2023   TRIG 128.0 05/06/2023   HDL 42.00 05/06/2023   LDLDIRECT 98.0 02/28/2018   LDLCALC 51 05/06/2023   ALT 20 05/06/2023   AST 19 05/06/2023   NA 137 05/06/2023   K 3.8 05/06/2023   CL 101 05/06/2023   CREATININE 0.97 05/06/2023   BUN 16 05/06/2023   CO2 29 05/06/2023   TSH 1.13 11/12/2022   PSA 0.25 11/12/2022   INR 1.0 09/12/2019   HGBA1C 5.8 11/12/2022    No results found.  Assessment & Plan:  Essential hypertension- BP is well controlled. -     Basic metabolic panel with GFR; Future -     CBC with Differential/Platelet; Future -     Hepatic function panel; Future -     Urinalysis, Routine w reflex microscopic; Future  Hyperglyceridemia, pure -     Lipid panel; Future -     Hepatic function panel; Future  Hyperlipidemia LDL goal <130 -     Lipid panel; Future -     Hepatic function panel; Future  DOE (dyspnea on exertion)- Will evaluate with a CA CT scan. -     EKG 12-Lead -     Thyroid  Panel With TSH; Future -     Troponin I (High Sensitivity);  Future -     Brain natriuretic peptide; Future -     CT CORONARY MORPH W/CTA COR W/SCORE W/CA W/CM &/OR WO/CM; Future  Bradycardia -     EKG 12-Lead -     Thyroid  Panel With TSH; Future -     Troponin I (High Sensitivity); Future -  Brain natriuretic peptide; Future  Fatigue due to excessive exertion, initial encounter -     Thyroid  Panel With TSH; Future -     Troponin I (High Sensitivity); Future -     Brain natriuretic peptide; Future -     CT CORONARY MORPH W/CTA COR W/SCORE W/CA W/CM &/OR WO/CM; Future  Hypogonadism male -     Testosterone  Total,Free,Bio, Males; Future  Primary osteoarthritis of both knees -     Meloxicam ; Take 1 tablet (7.5 mg total) by mouth daily.  Dispense: 90 tablet; Refill: 0  Agatston CAC score, >400 -     CT CORONARY MORPH W/CTA COR W/SCORE W/CA W/CM &/OR WO/CM; Future     Follow-up: Return in about 3 months (around 08/06/2023).  Sandra Crouch, MD

## 2023-05-07 LAB — TESTOSTERONE TOTAL,FREE,BIO, MALES
Albumin: 4.3 g/dL (ref 3.6–5.1)
Sex Hormone Binding: 38 nmol/L (ref 22–77)
Testosterone, Bioavailable: 302.8 ng/dL (ref 110.0–575.0)
Testosterone, Free: 153.8 pg/mL (ref 46.0–224.0)
Testosterone: 1065 ng/dL — ABNORMAL HIGH (ref 250–827)

## 2023-05-07 LAB — THYROID PANEL WITH TSH
Free Thyroxine Index: 3 (ref 1.4–3.8)
T3 Uptake: 32 % (ref 22–35)
T4, Total: 9.5 ug/dL (ref 4.9–10.5)
TSH: 0.86 m[IU]/L (ref 0.40–4.50)

## 2023-05-13 ENCOUNTER — Other Ambulatory Visit: Payer: Self-pay | Admitting: Internal Medicine

## 2023-05-13 DIAGNOSIS — F5104 Psychophysiologic insomnia: Secondary | ICD-10-CM

## 2023-05-13 DIAGNOSIS — F320 Major depressive disorder, single episode, mild: Secondary | ICD-10-CM

## 2023-05-19 ENCOUNTER — Encounter (HOSPITAL_COMMUNITY): Payer: Self-pay

## 2023-05-20 ENCOUNTER — Other Ambulatory Visit: Payer: Self-pay | Admitting: Internal Medicine

## 2023-05-20 DIAGNOSIS — F320 Major depressive disorder, single episode, mild: Secondary | ICD-10-CM

## 2023-05-20 DIAGNOSIS — F5104 Psychophysiologic insomnia: Secondary | ICD-10-CM

## 2023-05-21 ENCOUNTER — Ambulatory Visit (HOSPITAL_COMMUNITY)
Admission: RE | Admit: 2023-05-21 | Discharge: 2023-05-21 | Disposition: A | Discharge status: Home / Self Care | Source: Ambulatory Visit | Attending: Internal Medicine | Admitting: Internal Medicine

## 2023-05-21 DIAGNOSIS — I2510 Atherosclerotic heart disease of native coronary artery without angina pectoris: Secondary | ICD-10-CM | POA: Insufficient documentation

## 2023-05-21 DIAGNOSIS — T733XXA Exhaustion due to excessive exertion, initial encounter: Secondary | ICD-10-CM | POA: Diagnosis not present

## 2023-05-21 DIAGNOSIS — R931 Abnormal findings on diagnostic imaging of heart and coronary circulation: Secondary | ICD-10-CM | POA: Insufficient documentation

## 2023-05-21 DIAGNOSIS — R0609 Other forms of dyspnea: Secondary | ICD-10-CM | POA: Insufficient documentation

## 2023-05-21 MED ORDER — NITROGLYCERIN 0.4 MG SL SUBL
SUBLINGUAL_TABLET | SUBLINGUAL | Status: AC
  Filled 2023-05-21: qty 2

## 2023-05-21 MED ORDER — NITROGLYCERIN 0.4 MG SL SUBL
0.8000 mg | SUBLINGUAL_TABLET | Freq: Once | SUBLINGUAL | Status: AC
  Administered 2023-05-21: 0.8 mg via SUBLINGUAL

## 2023-05-21 MED ORDER — IOHEXOL 350 MG/ML SOLN
100.0000 mL | Freq: Once | INTRAVENOUS | Status: AC | PRN
  Administered 2023-05-21: 100 mL via INTRAVENOUS

## 2023-05-24 ENCOUNTER — Ambulatory Visit (HOSPITAL_COMMUNITY)
Admission: RE | Admit: 2023-05-24 | Discharge: 2023-05-24 | Disposition: A | Discharge status: Home / Self Care | Source: Ambulatory Visit | Attending: Internal Medicine | Admitting: Internal Medicine

## 2023-05-24 ENCOUNTER — Other Ambulatory Visit: Payer: Self-pay | Admitting: Internal Medicine

## 2023-05-24 ENCOUNTER — Ambulatory Visit: Payer: Self-pay | Admitting: Internal Medicine

## 2023-05-24 DIAGNOSIS — I2510 Atherosclerotic heart disease of native coronary artery without angina pectoris: Secondary | ICD-10-CM

## 2023-05-24 DIAGNOSIS — R931 Abnormal findings on diagnostic imaging of heart and coronary circulation: Secondary | ICD-10-CM | POA: Diagnosis not present

## 2023-05-24 NOTE — Progress Notes (Signed)
CT sent for FFR 

## 2023-05-26 ENCOUNTER — Encounter (HOSPITAL_BASED_OUTPATIENT_CLINIC_OR_DEPARTMENT_OTHER): Payer: Self-pay | Admitting: Cardiology

## 2023-05-27 NOTE — Telephone Encounter (Signed)
 Can we schedule him appointment with either me or APP within the next few weeks?

## 2023-06-19 ENCOUNTER — Other Ambulatory Visit: Payer: Self-pay | Admitting: Internal Medicine

## 2023-06-19 DIAGNOSIS — E291 Testicular hypofunction: Secondary | ICD-10-CM

## 2023-06-19 DIAGNOSIS — I10 Essential (primary) hypertension: Secondary | ICD-10-CM

## 2023-06-22 ENCOUNTER — Other Ambulatory Visit: Payer: Self-pay | Admitting: Internal Medicine

## 2023-06-29 ENCOUNTER — Other Ambulatory Visit: Payer: Self-pay | Admitting: Internal Medicine

## 2023-06-29 DIAGNOSIS — I10 Essential (primary) hypertension: Secondary | ICD-10-CM

## 2023-06-30 ENCOUNTER — Other Ambulatory Visit: Payer: Self-pay | Admitting: Internal Medicine

## 2023-06-30 DIAGNOSIS — I10 Essential (primary) hypertension: Secondary | ICD-10-CM

## 2023-07-22 ENCOUNTER — Encounter: Payer: Self-pay | Admitting: Nurse Practitioner

## 2023-07-22 ENCOUNTER — Ambulatory Visit: Attending: Nurse Practitioner | Admitting: Nurse Practitioner

## 2023-07-22 VITALS — BP 130/88 | HR 57 | Ht 72.0 in | Wt 199.6 lb

## 2023-07-22 DIAGNOSIS — E785 Hyperlipidemia, unspecified: Secondary | ICD-10-CM | POA: Diagnosis not present

## 2023-07-22 DIAGNOSIS — R5382 Chronic fatigue, unspecified: Secondary | ICD-10-CM

## 2023-07-22 DIAGNOSIS — I10 Essential (primary) hypertension: Secondary | ICD-10-CM

## 2023-07-22 DIAGNOSIS — I2510 Atherosclerotic heart disease of native coronary artery without angina pectoris: Secondary | ICD-10-CM | POA: Diagnosis not present

## 2023-07-22 MED ORDER — AMLODIPINE BESYLATE 2.5 MG PO TABS: 2.5000 mg | ORAL_TABLET | Freq: Every day | ORAL | 3 refills | Status: DC

## 2023-07-22 MED ORDER — ISOSORBIDE MONONITRATE ER 30 MG PO TB24: 30.0000 mg | ORAL_TABLET | Freq: Every day | ORAL | 3 refills | Status: DC

## 2023-07-22 NOTE — Progress Notes (Signed)
 Office Visit    Patient Name: Juan [name] Date of Encounter: 07/22/2023  Primary Care Provider:  Joshua Debby CROME, MD Primary Cardiologist:  Lonni CROME Nanas, MD  Chief Complaint    68 year old male with history of nonobstructive CAD, hypertension, and hyperlipidemia who presents for follow-up related to CAD.  Past Medical History    Past Medical History:  Diagnosis Date   COVID-19 04/23/2020   Hypertension    Personal history of colonic polyps - adenoma 04/24/2008   04/2008 - diminutive adenoma 06/13/2013     Past Surgical History:  Procedure Laterality Date   APPENDECTOMY  01/05/2005   ruptured   COLONOSCOPY     REPLACEMENT TOTAL KNEE Right 10/2020    Allergies  Allergies  Allergen Reactions   Ace Inhibitors     REACTION: cough     Labs/Other Studies Reviewed    The following studies were reviewed today:  Cardiac Studies & Procedures   ______________________________________________________________________________________________   STRESS TESTS  MYOCARDIAL PERFUSION IMAGING 07/21/2019  Interpretation Summary  The left ventricular ejection fraction is mildly decreased (45-54%).  Nuclear stress EF: 51%.  No T wave inversion was noted during stress.  There was no ST segment deviation noted during stress.  Defect 1: There is a large defect of moderate severity present in the mid inferoseptal, mid inferior, apical septal and apical inferior location.  This is an intermediate risk study.  Large size, moderate intensity fixed inferior/inferoseptal perfusion defect with normal wall motion, suggestive of artifact or less likely scar. No significant reversible ischemia. LVEF 51% with normal wall motion. This is an intermediate risk study given the size of the defect.   ECHOCARDIOGRAM  ECHOCARDIOGRAM COMPLETE 08/03/2019  Narrative ECHOCARDIOGRAM REPORT    Patient Name:   Juan [name]  Date of Exam: 08/03/2019 Medical Rec #:  [id number]     Height:        72.0 in Accession #:    [id number]    Weight:       180.0 lb Date of Birth:  [birth year]     BSA:          2.037 m Patient Age:    64 years      BP:           119/79 mmHg Patient Gender: M             HR:           57 bpm. Exam Location:  [address]  Procedure: 2D Echo, 3D Echo, Cardiac Doppler, Color Doppler and Strain Analysis  Indications:    R07.9 Chest pain  History:        Patient has no prior history of Echocardiogram examinations. Risk Factors:Hypertension, Dyslipidemia, Former Games developer and Former cocaine and marijuana abuse.  Sonographer:    Carl Rodgers-Jones RDCS Referring Phys: [id number] CHRISTOPHER L SCHUMANN  IMPRESSIONS   1. Left ventricular ejection fraction, by estimation, is 60 to 65%. The left ventricle has normal function. The left ventricle has no regional wall motion abnormalities. Left ventricular diastolic parameters were normal. 2. Right ventricular systolic function is normal. The right ventricular size is normal. 3. The mitral valve is normal in structure. Trivial mitral valve regurgitation. No evidence of mitral stenosis. 4. The aortic valve is normal in structure. Aortic valve regurgitation is mild. Mild aortic valve sclerosis is present, with no evidence of aortic valve stenosis. 5. The inferior vena cava is normal in size with greater than 50% respiratory variability, suggesting right atrial pressure  of 3 mmHg.  FINDINGS Left Ventricle: Left ventricular ejection fraction, by estimation, is 60 to 65%. The left ventricle has normal function. The left ventricle has no regional wall motion abnormalities. The left ventricular internal cavity size was normal in size. There is no left ventricular hypertrophy. Left ventricular diastolic parameters were normal.  Right Ventricle: The right ventricular size is normal. No increase in right ventricular wall thickness. Right ventricular systolic function is normal.  Left Atrium: Left atrial size was normal in  size.  Right Atrium: Right atrial size was normal in size.  Pericardium: There is no evidence of pericardial effusion.  Mitral Valve: The mitral valve is normal in structure. Normal mobility of the mitral valve leaflets. Trivial mitral valve regurgitation. No evidence of mitral valve stenosis.  Tricuspid Valve: The tricuspid valve is normal in structure. Tricuspid valve regurgitation is trivial. No evidence of tricuspid stenosis.  Aortic Valve: The aortic valve is normal in structure. Aortic valve regurgitation is mild. Mild aortic valve sclerosis is present, with no evidence of aortic valve stenosis.  Pulmonic Valve: The pulmonic valve was normal in structure. Pulmonic valve regurgitation is not visualized. No evidence of pulmonic stenosis.  Aorta: The aortic root is normal in size and structure.  Venous: The inferior vena cava is normal in size with greater than 50% respiratory variability, suggesting right atrial pressure of 3 mmHg.  IAS/Shunts: No atrial level shunt detected by color flow Doppler.   LEFT VENTRICLE PLAX 2D LVIDd:         5.00 cm  Diastology LVIDs:         2.60 cm  LV e' lateral:   11.60 cm/s LV PW:         1.00 cm  LV E/e' lateral: 6.3 LV IVS:        1.00 cm  LV e' medial:    5.66 cm/s LVOT diam:     2.10 cm  LV E/e' medial:  13.0 LV SV:         89 LV SV Index:   44       2D Longitudinal Strain LVOT Area:     3.46 cm 2D Strain GLS (A2C):   -24.7 % 2D Strain GLS (A3C):   -22.6 % 2D Strain GLS (A4C):   -20.6 % 2D Strain GLS Avg:     -22.6 %  3D Volume EF: 3D EF:        66 % LV EDV:       119 ml LV ESV:       41 ml LV SV:        78 ml  RIGHT VENTRICLE             IVC RV Basal diam:  3.60 cm     IVC diam: 2.00 cm RV S prime:     11.70 cm/s TAPSE (M-mode): 2.1 cm  LEFT ATRIUM             Index       RIGHT ATRIUM           Index LA diam:        4.30 cm 2.11 cm/m  RA Area:     13.00 cm LA Vol (A2C):   52.1 ml 25.57 ml/m RA Volume:   33.20 ml  16.30  ml/m LA Vol (A4C):   47.9 ml 23.51 ml/m LA Biplane Vol: 51.3 ml 25.18 ml/m AORTIC VALVE LVOT Vmax:   121.00 cm/s LVOT Vmean:  88.550 cm/s LVOT VTI:  0.257 m  AORTA Ao Root diam: 3.80 cm Ao Asc diam:  3.50 cm  MITRAL VALVE MV Area (PHT): 3.72 cm    SHUNTS MV Decel Time: 204 msec    Systemic VTI:  0.26 m MV E velocity: 73.30 cm/s  Systemic Diam: 2.10 cm MV A velocity: 58.70 cm/s MV E/A ratio:  1.25  Maude Emmer MD Electronically signed by Maude Emmer MD Signature Date/Time: 08/03/2019/9:28:48 AM    Final      CT SCANS  CT CORONARY MORPH W/CTA COR W/SCORE 05/21/2023  Addendum 05/25/2023 10:13 PM ADDENDUM REPORT: 05/25/2023 22:11  EXAM: OVER-READ INTERPRETATION  CT CHEST  The following report is an over-read performed by radiologist Dr. Suzen Dials of [facility] on 05/25/2023. This over-read does not include interpretation of cardiac or coronary anatomy or pathology. The coronary calcium  score/coronary CTA interpretation by the cardiologist is attached.  COMPARISON:  June 23, 2019  FINDINGS: Cardiovascular: There are no significant extracardiac vascular findings.  Mediastinum/Nodes: There are no enlarged lymph nodes within the visualized mediastinum.  Lungs/Pleura: There is no pleural effusion. The visualized lungs appear clear.  Upper abdomen: No significant findings in the visualized upper abdomen.  Musculoskeletal/Chest wall: No chest wall mass or suspicious osseous findings within the visualized chest.  IMPRESSION: No significant extracardiac findings within the visualized chest.   Electronically Signed By: Suzen Dials M.D. On: 05/25/2023 22:11  Narrative CLINICAL DATA:  68 yo male with DOE and high calcium  score  EXAM: Cardiac/Coronary CTA  TECHNIQUE: A non-contrast, gated CT scan was obtained with axial slices of 2.5 mm through the heart for calcium  scoring. Calcium  scoring was performed using the Agatston  method. A 120 kV prospective, gated, contrast cardiac CT scan was obtained. Gantry rotation speed was 230 msec and collimation was 0.63 mm. Two sublingual nitroglycerin  tablets (0.8 mg) were given. The 3D data set was reconstructed with motion correction for the best systolic or diastolic phase. Images were analyzed on a dedicated workstation using MPR, MIP, and VRT modes. The patient received 100mL OMNIPAQUE  IOHEXOL  350 MG/ML SOLN of contrast.  FINDINGS: Image quality: Excellent.  Noise artifact is: Limited.  Coronary arteries: Normal coronary origins.  Right dominance.  Right Coronary Artery: Dominant. Heavily calcified in the mid and distal vessel with probably severe 70-99% proximal vessel stenosis and 50-69% distal vessel stenosis. Calcified ostial R-PDA branch with mild 25-49% stenosis. Normal small R-PLB branch.  Left Main Coronary Artery: Eccentric calcification with minimal 1-24% stenosis. Bifurcates into the LAD and LCx arteries.  Left Anterior Descending Coronary Artery: Large anterior artery that wraps around the apex. There is heavy proximal calcification with at least moderate 50-69% stenosis. The LAD sharply tapers distal to this. A large proximal D1 branch is noted, with proximal calcification but no stenosis.  Left Circumflex Artery: AV groove vessel which demonstrates mild mixed 25-49% proximal stenosis. Large mid-vessel OM branch which is calcified proximally, but demonstrates only mild 25-49% stenosis.  Aorta: Normal size, 33 mm at the mid ascending aorta (level of the PA bifurcation) measured double oblique. No calcifications. No dissection.  Aortic Valve: Trileaflet. No calcifications.  Other findings:  Normal pulmonary vein drainage into the left atrium.  Normal left atrial appendage without a thrombus.  Normal size of the pulmonary artery.  Extra-cardiac findings: See attached radiology report for non-cardiac structures.  IMPRESSION: 1.  Moderate to severe mixed CAD, possible obstructive, CADRADS = 4. CT FFR will be performed and reported separately.  2. Normal coronary origin with right  dominance.  3. Coronary artery calcium  score is 2502, which places the patient in the 96th percentile for age/race and sex-matched controls (MESA).  4. Total plaque volume 1825 mm3, of which 754 mm3 (41%) is calcified plaque and 1071 mm3 (59%) is non-calcified plaque. TPV is (extensive).  RECOMMENDATIONS: 1. CAD-RADS 0: No evidence of CAD (0%). Consider non-atherosclerotic causes of chest pain.  2. CAD-RADS 1: Minimal non-obstructive CAD (0-24%). Consider non-atherosclerotic causes of chest pain. Consider preventive therapy and risk factor modification.  3. CAD-RADS 2: Mild non-obstructive CAD (25-49%). Consider non-atherosclerotic causes of chest pain. Consider preventive therapy and risk factor modification.  4. CAD-RADS 3: Moderate stenosis. Consider symptom-guided anti-ischemic pharmacotherapy as well as risk factor modification per guideline directed care. Additional analysis with CT FFR will be submitted.  5. CAD-RADS 4: Severe stenosis. (70-99% or > 50% left main). Cardiac catheterization or CT FFR is recommended. Consider symptom-guided anti-ischemic pharmacotherapy as well as risk factor modification per guideline directed care. Invasive coronary angiography recommended with revascularization per published guideline statements.  6. CAD-RADS 5: Total coronary occlusion (100%). Consider cardiac catheterization or viability assessment. Consider symptom-guided anti-ischemic pharmacotherapy as well as risk factor modification per guideline directed care.  7. CAD-RADS N: Non-diagnostic study. Obstructive CAD can't be excluded. Alternative evaluation is recommended.  Vinie Maxcy, MD  Electronically Signed: By: Vinie JAYSON Maxcy M.D. On: 05/24/2023 10:23   CT SCANS  CT CARDIAC SCORING (SELF PAY ONLY)  06/23/2019  Addendum 06/23/2019 11:01 PM ADDENDUM REPORT: 06/23/2019 22:59  CLINICAL DATA:  Risk stratification  EXAM: Coronary Calcium  Score  TECHNIQUE: The patient was scanned on a [employer] scanner. Axial non-contrast 3 mm slices were carried out through the heart. The data set was analyzed on a dedicated work station and scored using the Agatson method.  FINDINGS: Non-cardiac: See separate report from [facility].  Ascending Aorta: Normal caliber.  Scattered calcifications.  Pericardium: Normal  Coronary arteries: Normal coronary origins.  IMPRESSION: Coronary calcium  score of 1716. This was 97th percentile for age and sex matched control.  Wilbert Bihari   Electronically Signed By: Wilbert Bihari On: 06/23/2019 22:59  Narrative EXAM: OVER-READ INTERPRETATION  CT CHEST  The following report is an over-read performed by radiologist Dr. Toribio Aye of [facility] on 06/23/2019. This over-read does not include interpretation of cardiac or coronary anatomy or pathology. The coronary calcium  score interpretation by the cardiologist is attached.  COMPARISON:  None.  FINDINGS: Aortic atherosclerosis. Within the visualized portions of the thorax there are no suspicious appearing pulmonary nodules or masses, there is no acute consolidative airspace disease, no pleural effusions, no pneumothorax and no lymphadenopathy. Visualized portions of the upper abdomen are unremarkable. There are no aggressive appearing lytic or blastic lesions noted in the visualized portions of the skeleton.  IMPRESSION: 1.  Aortic Atherosclerosis (ICD10-I70.0).  Electronically Signed: By: Toribio Aye M.D. On: 06/23/2019 08:53     ______________________________________________________________________________________________     Recent Labs: 05/06/2023: ALT 20; BUN 16; Creatinine, Ser 0.97; Hemoglobin 15.0; Platelets 299.0; Potassium 3.8; Pro B  Natriuretic peptide (BNP) 65.0; Sodium 137; TSH 0.86  Recent Lipid Panel    Component Value Date/Time   CHOL 118 05/06/2023 0932   CHOL 130 09/28/2019 1006   TRIG 128.0 05/06/2023 0932   HDL 42.00 05/06/2023 0932   HDL 46 09/28/2019 1006   CHOLHDL 3 05/06/2023 0932   VLDL 25.6 05/06/2023 0932   LDLCALC 51 05/06/2023 0932   LDLCALC 61 09/28/2019 1006   LDLDIRECT 98.0 02/28/2018  1546    History of Present Illness    68 year old male with the above past medical history including nonobstructive CAD, hypertension, and hyperlipidemia.  Coronary calcium  score in 2021 was 1716 (97th percentile).  Lexiscan  Myoview  in 07/2019 showed large moderate intensity fixed inferior/ inferoseptal perfusion defect with normal wall motion, suggestive of artifact, no significant reversible ischemia, EF 51%.  Follow-up echocardiogram in 07/2019 showed EF 60 to 65%, normal LV function, no RWMA, normal RV systolic function, no significant valvular abnormalities.  He was last seen in the office on 08/25/2022 and was doing well from a cardiac standpoint.  Coronary CT angiogram in 05/2023 in the setting of fatigue, mild dyspnea on exertion per PCP showed coronary calcium  score 2502 (96 percentile), extensive TPV, borderline proximal to mid RCA stenosis FFR 0.63 in the distal RPDA, small vessel.  Symptom guided anti-ischemic medical therapy and aggressive secondary risk reduction was recommended.  He presents today for follow-up.  Since his last visit he has been stable from a cardiac standpoint.  He has had ongoing symptoms of fatigue, he denies any chest pain, significant dyspnea.  Overall, he reports feeling well.    Home Medications    Current Outpatient Medications  Medication Sig Dispense Refill   ASPIRIN  LOW DOSE 81 MG tablet TAKE 1 TABLET(81 MG) BY MOUTH DAILY 90 tablet 1   atorvastatin  (LIPITOR) 40 MG tablet TAKE 1 TABLET(40 MG) BY MOUTH DAILY (Patient taking differently: Take 40 mg by mouth daily.) 90 tablet 4    carvedilol  (COREG ) 3.125 MG tablet TAKE 1 TABLET(3.125 MG) BY MOUTH TWICE DAILY WITH A MEAL (Patient taking differently: Take 3.125 mg by mouth in the morning and at bedtime.) 180 tablet 0   indapamide  (LOZOL ) 1.25 MG tablet TAKE 1 TABLET(1.25 MG) BY MOUTH DAILY 90 tablet 0   losartan  (COZAAR ) 100 MG tablet TAKE 1 TABLET(100 MG) BY MOUTH DAILY (Patient taking differently: Take 100 mg by mouth daily. TAKE 1 TABLET(100 MG) BY MOUTH DAILY) 90 tablet 0   meloxicam  (MOBIC ) 7.5 MG tablet Take 1 tablet (7.5 mg total) by mouth daily. (Patient taking differently: Take 7.5 mg by mouth as needed.) 90 tablet 0   tadalafil  (CIALIS ) 5 MG tablet TAKE 1 TABLET(5 MG) BY MOUTH DAILY AS NEEDED FOR ERECTILE DYSFUNCTION (Patient taking differently: Take 5 mg by mouth as needed.) 90 tablet 1   testosterone  (ANDROGEL ) 50 MG/5GM (1%) GEL PLACE 5 GRAMS ONTO THE SKIN DAILY (Patient taking differently: 5 g daily.) 450 g 0   trazodone  (DESYREL ) 300 MG tablet TAKE 1 TABLET(300 MG) BY MOUTH AT BEDTIME 90 tablet 0   No current facility-administered medications for this visit.     Review of Systems    He denies chest pain, palpitations, dyspnea, pnd, orthopnea, n, v, dizziness, syncope, edema, weight gain, or early satiety. All other systems reviewed and are otherwise negative except as noted above.   Physical Exam    VS:  BP 130/88   Pulse (!) 57   Ht 6' (1.829 m)   Wt 199 lb 9.6 oz (90.5 kg)   SpO2 95%   BMI 27.07 kg/m  GEN: Well nourished, well developed, in no acute distress. HEENT: normal. Neck: Supple, no JVD, carotid bruits, or masses. Cardiac: RRR, no murmurs, rubs, or gallops. No clubbing, cyanosis, edema.  Radials/DP/PT 2+ and equal bilaterally.  Respiratory:  Respirations regular and unlabored, clear to auscultation bilaterally. GI: Soft, nontender, nondistended, BS + x 4. MS: no deformity or atrophy. Skin: warm and dry, no  rash. Neuro:  Strength and sensation are intact. Psych: Normal  affect.  Accessory Clinical Findings    ECG personally reviewed by me today -    - no EKG in office today.    Lab Results  Component Value Date   WBC 5.2 05/06/2023   HGB 15.0 05/06/2023   HCT 44.8 05/06/2023   MCV 89.0 05/06/2023   PLT 299.0 05/06/2023   Lab Results  Component Value Date   CREATININE 0.97 05/06/2023   BUN 16 05/06/2023   NA 137 05/06/2023   K 3.8 05/06/2023   CL 101 05/06/2023   CO2 29 05/06/2023   Lab Results  Component Value Date   ALT 20 05/06/2023   AST 19 05/06/2023   ALKPHOS 25 (L) 05/06/2023   BILITOT 0.6 05/06/2023   Lab Results  Component Value Date   CHOL 118 05/06/2023   HDL 42.00 05/06/2023   LDLCALC 51 05/06/2023   LDLDIRECT 98.0 02/28/2018   TRIG 128.0 05/06/2023   CHOLHDL 3 05/06/2023    Lab Results  Component Value Date   HGBA1C 5.8 11/12/2022    Assessment & Plan   1. CAD/generalized fatigue: Coronary CT angiogram in 05/2023 in the setting of fatigue, mild dyspnea on exertion showed coronary calcium  score 2502 (96 percentile), extensive TPV, borderline proximal to mid RCA stenosis FFR 0.63 in the distal RPDA, small vessel.  Symptom guided anti-ischemic medical therapy and aggressive secondary risk reduction was recommended.  He continues to report symptoms of fatigue, he denies chest pain, no significant dyspnea.  Not  a candidate for Imdur  given regular Cialis  use.  Will trial amlodipine  2.5 mg daily.  He is getting back into his yoga practice.  Encouraged increased activity as tolerated.  Continue to monitor symptoms.  Continue aspirin , carvedilol , losartan , Lipitor.  2. Hypertension: BP mildly elevated in office today, generally well-controlled.  Continue current antihypertensive medication with addition of amlodipine  as above.  3. Hyperlipidemia: LDL was 51 in 05/2023. Continue Lipitor.   4. Disposition: Follow-up in 3 months, sooner if needed.       Damien JAYSON Braver, NP 07/22/2023, 8:21 AM

## 2023-07-22 NOTE — Patient Instructions (Addendum)
 Medication Instructions:  Start Amlodipine  2.5 mg daily   *If you need a refill on your cardiac medications before your next appointment, please call your pharmacy*  Lab Work: NONE ordered at this time of appointment   Testing/Procedures: NONE ordered at this time of appointment   Follow-Up: At [facility], you and your health needs are our priority.  As part of our continuing mission to provide you with exceptional heart care, our providers are all part of one team.  This team includes your primary Cardiologist (physician) and Advanced Practice Providers or APPs (Physician Assistants and Nurse Practitioners) who all work together to provide you with the care you need, when you need it.  Your next appointment:   3 month(s)  Provider:   Lonni LITTIE Nanas, MD or Damien Braver, NP          We recommend signing up for the patient portal called MyChart.  Sign up information is provided on this After Visit Summary.  MyChart is used to connect with patients for Virtual Visits (Telemedicine).  Patients are able to view lab/test results, encounter notes, upcoming appointments, etc.  Non-urgent messages can be sent to your provider as well.   To learn more about what you can do with MyChart, go to [email or web address].

## 2023-07-25 ENCOUNTER — Encounter: Payer: Self-pay | Admitting: Nurse Practitioner

## 2023-07-27 ENCOUNTER — Telehealth: Admitting: Family Medicine

## 2023-07-27 DIAGNOSIS — M62830 Muscle spasm of back: Secondary | ICD-10-CM

## 2023-07-27 MED ORDER — PREDNISONE 10 MG (21) PO TBPK: ORAL_TABLET | ORAL | 0 refills | Status: DC

## 2023-07-27 MED ORDER — METHOCARBAMOL 500 MG PO TABS
500.0000 mg | ORAL_TABLET | Freq: Four times a day (QID) | ORAL | 0 refills | Status: AC | PRN
Start: 2023-07-27 — End: 2023-08-06

## 2023-07-27 NOTE — Progress Notes (Signed)
 Virtual Visit Consent   Juan [name], you are scheduled for a virtual visit with a [facility] provider today. Just as with appointments in the office, your consent must be obtained to participate. Your consent will be active for this visit and any virtual visit you may have with one of our providers in the next 365 days. If you have a MyChart account, a copy of this consent can be sent to you electronically.  As this is a virtual visit, video technology does not allow for your provider to perform a traditional examination. This may limit your provider's ability to fully assess your condition. If your provider identifies any concerns that need to be evaluated in person or the need to arrange testing (such as labs, EKG, etc.), we will make arrangements to do so. Although advances in technology are sophisticated, we cannot ensure that it will always work on either your end or our end. If the connection with a video visit is poor, the visit may have to be switched to a telephone visit. With either a video or telephone visit, we are not always able to ensure that we have a secure connection.  By engaging in this virtual visit, you consent to the provision of healthcare and authorize for your insurance to be billed (if applicable) for the services provided during this visit. Depending on your insurance coverage, you may receive a charge related to this service.  I need to obtain your verbal consent now. Are you willing to proceed with your visit today? Juan [name] has provided verbal consent on 07/27/2023 for a virtual visit (video or telephone). Juan DELENA Darby, FNP  Date: 07/27/2023 1:03 PM   Virtual Visit via Video Note   I, Juan [name], connected with  Juan [name]  ([id number], 1955-10-17) on 07/27/23 at 12:45 PM EDT by a video-enabled telemedicine application and verified that I am speaking with the correct person using two identifiers.  Location: Patient: Virtual Visit Location Patient:  Home Provider: Virtual Visit Location Provider: Home Office   I discussed the limitations of evaluation and management by telemedicine and the availability of in person appointments. The patient expressed understanding and agreed to proceed.    History of Present Illness: Juan [name] is a 68 y.o. male, and is being seen today for back spasms. He has typically had this occur yearly. He reports not doing anything strenuous. No numbness or tingling down the legs. Feels a tightening sensation in his low back reducing his mobility. He reports this has been going on for a few days and is worsening.  He has not taken any NSAIDs.  He does have a prescription for meloxicam  at home that he takes for his knee as needed but has not needed it in at least 1 week.  He is flying to [place]  in 2 days and is concerned that he cannot get on a plane in his current condition.  He reports that in the past he has always been prescribed steroids and muscle relaxers to help.  He reports that the methocarbamol  helped more than the tizanidine  he has been prescribed in the past.  He reports that the muscle spasms will not get better without the prednisone .  HPI:  Problems:  Patient Active Problem List   Diagnosis Date Noted   Agatston CAC score, >400 05/06/2023   Need for immunization against influenza 11/13/2022   Encounter for general adult medical examination with abnormal findings 11/13/2022   Squamous blepharitis of right lower eyelid 10/25/2021  Benign prostatic hyperplasia without lower urinary tract symptoms 10/23/2021   Need for vaccination 05/12/2021   Prolapsed internal hemorrhoids, grade 2 10/23/2020   Chronic hyperglycemia 09/12/2019   Coronary atherosclerosis due to calcified coronary lesion 06/25/2019   Hyperglyceridemia, pure 01/31/2019   Current mild episode of major depressive disorder without prior episode (HCC) 02/28/2018   Degenerative arthritis of knee, bilateral 07/22/2016   Degenerative  arthritis of right knee 10/22/2015   Herniation of lumbar intervertebral disc 12/05/2014   Hyperlipidemia LDL goal <130 08/28/2014   Hypogonadism male 05/30/2009   Erectile dysfunction of organic origin 05/27/2009   Essential hypertension 04/25/2009   History of colonic polyps 04/24/2008    Allergies:  Allergies  Allergen Reactions   Ace Inhibitors     REACTION: cough   Medications:  Current Outpatient Medications:    methocarbamol  (ROBAXIN ) 500 MG tablet, Take 1 tablet (500 mg total) by mouth 4 (four) times daily as needed for up to 10 days for muscle spasms., Disp: 40 tablet, Rfl: 0   amLODipine  (NORVASC ) 2.5 MG tablet, Take 1 tablet (2.5 mg total) by mouth daily., Disp: 90 tablet, Rfl: 3   ASPIRIN  LOW DOSE 81 MG tablet, TAKE 1 TABLET(81 MG) BY MOUTH DAILY, Disp: 90 tablet, Rfl: 1   atorvastatin  (LIPITOR) 40 MG tablet, TAKE 1 TABLET(40 MG) BY MOUTH DAILY (Patient taking differently: Take 40 mg by mouth daily.), Disp: 90 tablet, Rfl: 4   carvedilol  (COREG ) 3.125 MG tablet, TAKE 1 TABLET(3.125 MG) BY MOUTH TWICE DAILY WITH A MEAL (Patient taking differently: Take 3.125 mg by mouth in the morning and at bedtime.), Disp: 180 tablet, Rfl: 0   indapamide  (LOZOL ) 1.25 MG tablet, TAKE 1 TABLET(1.25 MG) BY MOUTH DAILY, Disp: 90 tablet, Rfl: 0   losartan  (COZAAR ) 100 MG tablet, TAKE 1 TABLET(100 MG) BY MOUTH DAILY (Patient taking differently: Take 100 mg by mouth daily. TAKE 1 TABLET(100 MG) BY MOUTH DAILY), Disp: 90 tablet, Rfl: 0   meloxicam  (MOBIC ) 7.5 MG tablet, Take 1 tablet (7.5 mg total) by mouth daily. (Patient taking differently: Take 7.5 mg by mouth as needed.), Disp: 90 tablet, Rfl: 0   predniSONE  (STERAPRED UNI-PAK 21 TAB) 10 MG (21) TBPK tablet, Take 6 tablets on day one, 5 on day two, 4 on day three, 3 on day four, 2 on day five, and 1 on day six. Take with food., Disp: 21 tablet, Rfl: 0   tadalafil  (CIALIS ) 5 MG tablet, TAKE 1 TABLET(5 MG) BY MOUTH DAILY AS NEEDED FOR ERECTILE  DYSFUNCTION (Patient taking differently: Take 5 mg by mouth as needed.), Disp: 90 tablet, Rfl: 1   testosterone  (ANDROGEL ) 50 MG/5GM (1%) GEL, PLACE 5 GRAMS ONTO THE SKIN DAILY (Patient taking differently: 5 g daily.), Disp: 450 g, Rfl: 0   trazodone  (DESYREL ) 300 MG tablet, TAKE 1 TABLET(300 MG) BY MOUTH AT BEDTIME, Disp: 90 tablet, Rfl: 0  Observations/Objective: Patient is well-developed, well-nourished in no acute distress.  Resting upright and forward leaning sitting position, at home.  Appears to be stiff moving.  Head is normocephalic, atraumatic.  No labored breathing.  Speech is clear and coherent with logical content.  Patient is alert and oriented at baseline.    Assessment and Plan: 1. Muscle spasm of back - methocarbamol  (ROBAXIN ) 500 MG tablet; Take 1 tablet (500 mg total) by mouth 4 (four) times daily as needed for up to 10 days for muscle spasms.  Dispense: 40 tablet; Refill: 0 - predniSONE  (STERAPRED UNI-PAK 21 TAB) 10 MG (21)  TBPK tablet; Take 6 tablets on day one, 5 on day two, 4 on day three, 3 on day four, 2 on day five, and 1 on day six. Take with food.  Dispense: 21 tablet; Refill: 0  Discussed that I would strongly prefer he completes a course of NSAIDs prior to starting oral steroid. Lengthy conversation regarding risks and benefits of oral steroids.  Patient opts for steroids despite risks. Patient reports he has to fly in 2 days and that he has tried NSAIDs in the past without relief.  He insist that the only thing that will make his back pain better is a dose pack of steroids Prescription placed for methocarbamol  (Robaxin ) to be taken every 6 hours as needed for muscle spasms.  Do not drive or operate machinery while taking muscle relaxer's Prescription for prednisone  Dosepak sent to pharmacy. Recommend daily stretching however no heavy lifting until symptoms resolve Recommend consideration of follow-up with PCP due to persistent nature of symptoms and possibly  updated imaging  Follow Up Instructions: I discussed the assessment and treatment plan with the patient. The patient was provided an opportunity to ask questions and all were answered. The patient agreed with the plan and demonstrated an understanding of the instructions.  A copy of instructions were sent to the patient via MyChart unless otherwise noted below.   The patient was advised to call back or seek an in-person evaluation if the symptoms worsen or if the condition fails to improve as anticipated.    Juan DELENA Darby, FNP

## 2023-07-27 NOTE — Patient Instructions (Addendum)
 Juan [name], thank you for joining Olam DELENA Darby, FNP for today's virtual visit.  While this provider is not your primary care provider (PCP), if your PCP is located in our provider database this encounter information will be shared with them immediately following your visit.   A [facility] MyChart account gives you access to today's visit and all your visits, tests, and labs performed at [facility]  click here if you don't have a [name] MyChart account or go to [email or web address]  Consent: (Patient) Juan [name] provided verbal consent for this virtual visit at the beginning of the encounter.  Current Medications:  Current Outpatient Medications:    amLODipine  (NORVASC ) 2.5 MG tablet, Take 1 tablet (2.5 mg total) by mouth daily., Disp: 90 tablet, Rfl: 3   ASPIRIN  LOW DOSE 81 MG tablet, TAKE 1 TABLET(81 MG) BY MOUTH DAILY, Disp: 90 tablet, Rfl: 1   atorvastatin  (LIPITOR) 40 MG tablet, TAKE 1 TABLET(40 MG) BY MOUTH DAILY (Patient taking differently: Take 40 mg by mouth daily.), Disp: 90 tablet, Rfl: 4   carvedilol  (COREG ) 3.125 MG tablet, TAKE 1 TABLET(3.125 MG) BY MOUTH TWICE DAILY WITH A MEAL (Patient taking differently: Take 3.125 mg by mouth in the morning and at bedtime.), Disp: 180 tablet, Rfl: 0   indapamide  (LOZOL ) 1.25 MG tablet, TAKE 1 TABLET(1.25 MG) BY MOUTH DAILY, Disp: 90 tablet, Rfl: 0   losartan  (COZAAR ) 100 MG tablet, TAKE 1 TABLET(100 MG) BY MOUTH DAILY (Patient taking differently: Take 100 mg by mouth daily. TAKE 1 TABLET(100 MG) BY MOUTH DAILY), Disp: 90 tablet, Rfl: 0   meloxicam  (MOBIC ) 7.5 MG tablet, Take 1 tablet (7.5 mg total) by mouth daily. (Patient taking differently: Take 7.5 mg by mouth as needed.), Disp: 90 tablet, Rfl: 0   tadalafil  (CIALIS ) 5 MG tablet, TAKE 1 TABLET(5 MG) BY MOUTH DAILY AS NEEDED FOR ERECTILE DYSFUNCTION (Patient taking differently: Take 5 mg by mouth as needed.), Disp: 90 tablet, Rfl: 1   testosterone  (ANDROGEL ) 50  MG/5GM (1%) GEL, PLACE 5 GRAMS ONTO THE SKIN DAILY (Patient taking differently: 5 g daily.), Disp: 450 g, Rfl: 0   trazodone  (DESYREL ) 300 MG tablet, TAKE 1 TABLET(300 MG) BY MOUTH AT BEDTIME, Disp: 90 tablet, Rfl: 0   Medications ordered in this encounter:  No orders of the defined types were placed in this encounter.    *If you need refills on other medications prior to your next appointment, please contact your pharmacy*  Follow-Up: Call back or seek an in-person evaluation if the symptoms worsen or if the condition fails to improve as anticipated.  [facility] [phone]  Other Instructions Prescription placed for methocarbamol  (Robaxin ) to be taken every 6 hours as needed for muscle spasms.  Do not drive or operate machinery while taking muscle relaxer's Prescription for prednisone  Dosepak sent to pharmacy. Recommend daily stretching however no heavy lifting until symptoms resolve Recommend consideration of follow-up with PCP due to persistent nature of symptoms and possibly updated imaging   If you have been instructed to have an in-person evaluation today at a local Urgent Care facility, please use the link below. It will take you to a list of all of our available [facility], including address, phone number and hours of operation. Please do not delay care.   Urgent Cares  If you or a family member do not have a primary care provider, use the link below to schedule a visit and establish care. When you choose  a [facility] primary care physician or advanced practice provider, you gain a long-term partner in health. Find a Primary Care Provider  Learn more about [facility] in-office and virtual care options: [facility] - Get Care Now

## 2023-08-08 ENCOUNTER — Other Ambulatory Visit: Payer: Self-pay | Admitting: Internal Medicine

## 2023-08-08 DIAGNOSIS — M170 Bilateral primary osteoarthritis of knee: Secondary | ICD-10-CM

## 2023-08-10 NOTE — Telephone Encounter (Signed)
 Last OV 05/06/23 Next OV 11/12/23  Last refill 05/06/23 Qty #90/0   Renal labs 05/06/23  Component Ref Range & Units (hover) 3 mo ago (05/06/23) 9 mo ago (11/12/22) 1 yr ago (05/11/22) 1 yr ago (10/23/21) 2 yr ago (05/12/21) 2 yr ago (09/03/20) 3 yr ago (04/02/20)  Sodium 137 138 137 138 137 136 137  Potassium 3.8 4.2 3.8 4.1 3.5 4.2 3.9  Chloride 101 102 101 102 100 102 99  CO2 29 28 27 29 28 27 30   Glucose, Bld 92 89 112 High  104 High  116 High  90 91  BUN 16 21 19 16 22 24  High  23  Creatinine, Ser 0.97 1.03 0.90 0.92 0.93 0.94 0.95  GFR 80.49 75.15 CM 88.68 CM 86.70 CM 85.85 CM 85.17 CM 84.34 CM  Comment: Calculated using the CKD-EPI Creatinine Equation (2021)  Calcium  9.2 9.9 9.1 9.4 9.2 9.0 9.5

## 2023-08-11 ENCOUNTER — Other Ambulatory Visit: Payer: Self-pay | Admitting: Internal Medicine

## 2023-08-11 DIAGNOSIS — I2510 Atherosclerotic heart disease of native coronary artery without angina pectoris: Secondary | ICD-10-CM

## 2023-08-12 ENCOUNTER — Other Ambulatory Visit: Payer: Self-pay | Admitting: Internal Medicine

## 2023-08-12 DIAGNOSIS — M170 Bilateral primary osteoarthritis of knee: Secondary | ICD-10-CM

## 2023-08-12 NOTE — Telephone Encounter (Signed)
 Last OV 05/06/23 Next OV 11/12/23  Last refill 01/22/23 Qty #90/1

## 2023-09-11 ENCOUNTER — Other Ambulatory Visit: Payer: Self-pay | Admitting: Internal Medicine

## 2023-09-11 DIAGNOSIS — I10 Essential (primary) hypertension: Secondary | ICD-10-CM

## 2023-09-20 ENCOUNTER — Encounter: Payer: Self-pay | Admitting: Internal Medicine

## 2023-09-20 ENCOUNTER — Other Ambulatory Visit: Payer: Self-pay | Admitting: Internal Medicine

## 2023-09-20 DIAGNOSIS — I10 Essential (primary) hypertension: Secondary | ICD-10-CM

## 2023-09-30 ENCOUNTER — Other Ambulatory Visit: Payer: Self-pay | Admitting: Internal Medicine

## 2023-09-30 DIAGNOSIS — E291 Testicular hypofunction: Secondary | ICD-10-CM

## 2023-09-30 NOTE — Telephone Encounter (Unsigned)
 Copied from CRM [id number]. Topic: Clinical - Medication Refill >> Sep 30, 2023  4:28 PM Harlene ORN wrote: Medication: indapamide  (LOZOL ) 1.25 MG tablet  Has the patient contacted their pharmacy? Yes (Agent: If no, request that the patient contact the pharmacy for the refill. If patient does not wish to contact the pharmacy document the reason why and proceed with request.) (Agent: If yes, when and what did the pharmacy advise?)   [id number] [address], [place] - [address] AT [facility] [address] [place] [address] Phone: [phone] Fax: [phone]  Is this the correct pharmacy for this prescription? Yes If no, delete pharmacy and type the correct one.   Has the prescription been filled recently? No  Is the patient out of the medication? Yes  Has the patient been seen for an appointment in the last year OR does the patient have an upcoming appointment? Yes  Can we respond through MyChart? Yes  Agent: Please be advised that Rx refills may take up to 3 business days. We ask that you follow-up with your pharmacy.

## 2023-10-07 ENCOUNTER — Other Ambulatory Visit: Payer: Self-pay | Admitting: Internal Medicine

## 2023-10-07 DIAGNOSIS — I10 Essential (primary) hypertension: Secondary | ICD-10-CM

## 2023-10-07 DIAGNOSIS — E291 Testicular hypofunction: Secondary | ICD-10-CM

## 2023-10-28 ENCOUNTER — Ambulatory Visit: Admitting: Cardiology

## 2023-11-02 ENCOUNTER — Ambulatory Visit: Admitting: Internal Medicine

## 2023-11-02 ENCOUNTER — Encounter: Payer: Self-pay | Admitting: Internal Medicine

## 2023-11-02 VITALS — BP 132/76 | HR 67 | Temp 98.1°F | Resp 16 | Ht 72.0 in | Wt 201.6 lb

## 2023-11-02 DIAGNOSIS — E785 Hyperlipidemia, unspecified: Secondary | ICD-10-CM

## 2023-11-02 DIAGNOSIS — I10 Essential (primary) hypertension: Secondary | ICD-10-CM

## 2023-11-02 DIAGNOSIS — N400 Enlarged prostate without lower urinary tract symptoms: Secondary | ICD-10-CM

## 2023-11-02 DIAGNOSIS — Z23 Encounter for immunization: Secondary | ICD-10-CM | POA: Diagnosis not present

## 2023-11-02 DIAGNOSIS — E291 Testicular hypofunction: Secondary | ICD-10-CM

## 2023-11-02 LAB — CBC WITH DIFFERENTIAL/PLATELET
Basophils Absolute: 0.1 K/uL (ref 0.0–0.1)
Basophils Relative: 0.7 % (ref 0.0–3.0)
Eosinophils Absolute: 0.3 K/uL (ref 0.0–0.7)
Eosinophils Relative: 4.4 % (ref 0.0–5.0)
HCT: 44.1 % (ref 39.0–52.0)
Hemoglobin: 14.8 g/dL (ref 13.0–17.0)
Lymphocytes Relative: 22.5 % (ref 12.0–46.0)
Lymphs Abs: 1.6 K/uL (ref 0.7–4.0)
MCHC: 33.7 g/dL (ref 30.0–36.0)
MCV: 87.1 fl (ref 78.0–100.0)
Monocytes Absolute: 0.7 K/uL (ref 0.1–1.0)
Monocytes Relative: 9.9 % (ref 3.0–12.0)
Neutro Abs: 4.5 K/uL (ref 1.4–7.7)
Neutrophils Relative %: 62.5 % (ref 43.0–77.0)
Platelets: 340 K/uL (ref 150.0–400.0)
RBC: 5.06 Mil/uL (ref 4.22–5.81)
RDW: 13.9 % (ref 11.5–15.5)
WBC: 7.2 K/uL (ref 4.0–10.5)

## 2023-11-02 LAB — URINALYSIS, ROUTINE W REFLEX MICROSCOPIC
Bilirubin Urine: NEGATIVE
Hgb urine dipstick: NEGATIVE
Ketones, ur: NEGATIVE
Leukocytes,Ua: NEGATIVE
Nitrite: NEGATIVE
Specific Gravity, Urine: 1.01 (ref 1.000–1.030)
Total Protein, Urine: NEGATIVE
Urine Glucose: NEGATIVE
Urobilinogen, UA: 1 (ref 0.0–1.0)
pH: 7 (ref 5.0–8.0)

## 2023-11-02 LAB — BASIC METABOLIC PANEL WITH GFR
BUN: 22 mg/dL (ref 6–23)
CO2: 27 meq/L (ref 19–32)
Calcium: 9.5 mg/dL (ref 8.4–10.5)
Chloride: 103 meq/L (ref 96–112)
Creatinine, Ser: 1.04 mg/dL (ref 0.40–1.50)
GFR: 73.78 mL/min (ref >60.00)
Glucose, Bld: 124 mg/dL — ABNORMAL HIGH (ref 70–99)
Potassium: 3.9 meq/L (ref 3.5–5.1)
Sodium: 137 meq/L (ref 135–145)

## 2023-11-02 LAB — CK: Total CK: 102 U/L (ref 17–232)

## 2023-11-02 LAB — PSA: PSA: 0.28 ng/mL (ref 0.10–4.00)

## 2023-11-02 MED ORDER — COVID-19 MRNA VAC-TRIS(PFIZER) 30 MCG/0.3ML IM SUSY: 0.3000 mL | PREFILLED_SYRINGE | Freq: Once | INTRAMUSCULAR | 0 refills | Status: AC

## 2023-11-02 NOTE — Progress Notes (Unsigned)
 Subjective:  Patient ID: Juan [name], male    DOB: 12/21/1955  Age: 68 y.o. MRN: [id number]  CC: Hypertension   HPI [name] presents for f/up ---  He completed a 10-mile hike recently and did not experience any CP, DOE, diaphoresis, edema, fatigue, or palpitations.  Outpatient Medications Prior to Visit  Medication Sig Dispense Refill   ASPIRIN  LOW DOSE 81 MG tablet TAKE 1 TABLET(81 MG) BY MOUTH DAILY 90 tablet 1   atorvastatin  (LIPITOR) 40 MG tablet TAKE 1 TABLET(40 MG) BY MOUTH DAILY (Patient taking differently: Take 40 mg by mouth daily.) 90 tablet 4   carvedilol  (COREG ) 3.125 MG tablet TAKE 1 TABLET(3.125 MG) BY MOUTH TWICE DAILY WITH A MEAL 180 tablet 0   indapamide  (LOZOL ) 1.25 MG tablet TAKE 1 TABLET(1.25 MG) BY MOUTH DAILY 90 tablet 0   losartan  (COZAAR ) 100 MG tablet TAKE 1 TABLET(100 MG) BY MOUTH DAILY 90 tablet 0   meloxicam  (MOBIC ) 7.5 MG tablet TAKE 1 TABLET(7.5 MG) BY MOUTH DAILY 90 tablet 0   tadalafil  (CIALIS ) 5 MG tablet TAKE 1 TABLET(5 MG) BY MOUTH DAILY AS NEEDED FOR ERECTILE DYSFUNCTION (Patient taking differently: Take 5 mg by mouth as needed.) 90 tablet 1   trazodone  (DESYREL ) 300 MG tablet TAKE 1 TABLET(300 MG) BY MOUTH AT BEDTIME 90 tablet 0   testosterone  (ANDROGEL ) 50 MG/5GM (1%) GEL PLACE 5 GRAMS ONTO SKIN DAILY 450 g 0   amLODipine  (NORVASC ) 2.5 MG tablet Take 1 tablet (2.5 mg total) by mouth daily. 90 tablet 3   predniSONE  (STERAPRED UNI-PAK 21 TAB) 10 MG (21) TBPK tablet Take 6 tablets on day one, 5 on day two, 4 on day three, 3 on day four, 2 on day five, and 1 on day six. Take with food. 21 tablet 0   No facility-administered medications prior to visit.    ROS Review of Systems  Constitutional:  Negative for appetite change, chills, diaphoresis, fatigue and fever.  HENT: Negative.    Eyes:  Negative for visual disturbance.  Respiratory: Negative.  Negative for cough, chest tightness, shortness of breath and wheezing.   Cardiovascular:   Negative for chest pain, palpitations and leg swelling.  Gastrointestinal: Negative.  Negative for abdominal pain, constipation, diarrhea, nausea and vomiting.  Genitourinary:  Positive for frequency. Negative for difficulty urinating, dysuria and hematuria.  Musculoskeletal:  Positive for arthralgias. Negative for joint swelling and myalgias.  Skin:  Negative for color change and rash.  Neurological: Negative.  Negative for dizziness, weakness and light-headedness.  Hematological:  Negative for adenopathy. Does not bruise/bleed easily.  Psychiatric/Behavioral: Negative.      Objective:  BP 132/76 (BP Location: Left Arm, Patient Position: Sitting, Cuff Size: Normal)   Pulse 67   Temp 98.1 F (36.7 C) (Oral)   Resp 16   Ht 6' (1.829 m)   Wt 201 lb 9.6 oz (91.4 kg)   SpO2 94%   BMI 27.34 kg/m   BP Readings from Last 3 Encounters:  11/02/23 132/76  07/22/23 130/88  05/21/23 131/76    Wt Readings from Last 3 Encounters:  11/02/23 201 lb 9.6 oz (91.4 kg)  07/22/23 199 lb 9.6 oz (90.5 kg)  05/06/23 197 lb (89.4 kg)    Physical Exam Vitals reviewed.  Constitutional:      Appearance: Normal appearance.  HENT:     Nose: Nose normal.     Mouth/Throat:     Mouth: Mucous membranes are moist.  Eyes:     General: No scleral  icterus.    Conjunctiva/sclera: Conjunctivae normal.  Cardiovascular:     Rate and Rhythm: Normal rate and regular rhythm.     Heart sounds: No murmur heard.    No friction rub. No gallop.  Pulmonary:     Effort: Pulmonary effort is normal.     Breath sounds: No stridor. No wheezing, rhonchi or rales.  Abdominal:     General: Abdomen is flat.     Palpations: There is no mass.     Tenderness: There is no abdominal tenderness. There is no guarding.     Hernia: No hernia is present.  Musculoskeletal:        General: Normal range of motion.     Cervical back: Neck supple.     Right lower leg: No edema.     Left lower leg: No edema.  Lymphadenopathy:      Cervical: No cervical adenopathy.  Skin:    General: Skin is warm and dry.  Neurological:     General: No focal deficit present.     Mental Status: He is alert. Mental status is at baseline.  Psychiatric:        Mood and Affect: Mood normal.        Behavior: Behavior normal.     Lab Results  Component Value Date   WBC 7.2 11/02/2023   HGB 14.8 11/02/2023   HCT 44.1 11/02/2023   PLT 340.0 11/02/2023   GLUCOSE 124 (H) 11/02/2023   CHOL 118 05/06/2023   TRIG 128.0 05/06/2023   HDL 42.00 05/06/2023   LDLDIRECT 98.0 02/28/2018   LDLCALC 51 05/06/2023   ALT 20 05/06/2023   AST 19 05/06/2023   NA 137 11/02/2023   K 3.9 11/02/2023   CL 103 11/02/2023   CREATININE 1.04 11/02/2023   BUN 22 11/02/2023   CO2 27 11/02/2023   TSH 0.86 05/06/2023   PSA 0.28 11/02/2023   INR 1.0 09/12/2019   HGBA1C 5.8 11/12/2022    CT CORONARY FRACTIONAL FLOW RESERVE FLUID ANALYSIS Result Date: 05/24/2023 EXAM: CT FFR ANALYSIS CLINICAL DATA:  68 yo male with doe FINDINGS: FFRct analysis was performed on the original cardiac CT angiogram dataset. Diagrammatic representation of the FFRct analysis is provided in a separate PDF document in PACS. This dictation was created using the PDF document and an interactive 3D model of the results. 3D model is not available in the EMR/PACS. Normal FFR range is >0.80. 1. Left Main:  No significant stenosis. FFR = 1.00 2. LAD: No significant stenosis. Proximal FFR = 0.97, Mid FFR = 0.89, Distal FFR = not modeled 3. LCX: No significant stenosis. Proximal FFR = 0.98, Distal FFR = 0.93 4. RCA: Borderline significant stenosis. Proximal FFR = 0.99, Mid FFR = 0.79 (tapers sharply), Distal FFR = 0.79, Distal R-PDA = 0.63 IMPRESSION: 1. CT FFR analysis did not show any significant discrete stenosis (<0.75), however, the proximal to mid RCA is borderline significant (0.79) with a discrete drop in flow in the proximal to mid vessel segment. The most distal R-PDA is abnormal at 0.63,  however, this is a small vessel. 2. Consider symptom-guided anti-ischemic medical therapy and aggressive secondary risk reduction. Electronically Signed   By: Vinie JAYSON Maxcy M.D.   On: 05/24/2023 10:30    Assessment & Plan:  Need for immunization against influenza -     Flu vaccine HIGH DOSE PF(Fluzone Trivalent)  Essential hypertension- BP is well controlled. Lytes and renal function are normal. -     Basic metabolic  panel with GFR; Future -     CBC with Differential/Platelet; Future -     COVID-19 mRNA Vac-TriS(Pfizer); Inject 0.3 mLs into the muscle once for 1 dose.  Dispense: 0.3 mL; Refill: 0 -     Urinalysis, Routine w reflex microscopic; Future  Benign prostatic hyperplasia without lower urinary tract symptoms -     PSA; Future -     Urinalysis, Routine w reflex microscopic; Future  Hypogonadism male -     Testosterone  Total,Free,Bio, Males; Future -     Testosterone ; Place 5 g onto the skin daily.  Dispense: 450 g; Refill: 0  Hyperlipidemia LDL goal <130 -     CK; Future     Follow-up: Return in about 6 months (around 05/02/2024).  Debby Molt, MD

## 2023-11-02 NOTE — Patient Instructions (Signed)
 Hypertension, Adult High blood pressure (hypertension) is when the force of blood pumping through the arteries is too strong. The arteries are the blood vessels that carry blood from the heart throughout the body. Hypertension forces the heart to work harder to pump blood and may cause arteries to become narrow or stiff. Untreated or uncontrolled hypertension can lead to a heart attack, heart failure, a stroke, kidney disease, and other problems. A blood pressure reading consists of a higher number over a lower number. Ideally, your blood pressure should be below 120/80. The first ("top") number is called the systolic pressure. It is a measure of the pressure in your arteries as your heart beats. The second ("bottom") number is called the diastolic pressure. It is a measure of the pressure in your arteries as the heart relaxes. What are the causes? The exact cause of this condition is not known. There are some conditions that result in high blood pressure. What increases the risk? Certain factors may make you more likely to develop high blood pressure. Some of these risk factors are under your control, including: Smoking. Not getting enough exercise or physical activity. Being overweight. Having too much fat, sugar, calories, or salt (sodium) in your diet. Drinking too much alcohol. Other risk factors include: Having a personal history of heart disease, diabetes, high cholesterol, or kidney disease. Stress. Having a family history of high blood pressure and high cholesterol. Having obstructive sleep apnea. Age. The risk increases with age. What are the signs or symptoms? High blood pressure may not cause symptoms. Very high blood pressure (hypertensive crisis) may cause: Headache. Fast or irregular heartbeats (palpitations). Shortness of breath. Nosebleed. Nausea and vomiting. Vision changes. Severe chest pain, dizziness, and seizures. How is this diagnosed? This condition is diagnosed by  measuring your blood pressure while you are seated, with your arm resting on a flat surface, your legs uncrossed, and your feet flat on the floor. The cuff of the blood pressure monitor will be placed directly against the skin of your upper arm at the level of your heart. Blood pressure should be measured at least twice using the same arm. Certain conditions can cause a difference in blood pressure between your right and left arms. If you have a high blood pressure reading during one visit or you have normal blood pressure with other risk factors, you may be asked to: Return on a different day to have your blood pressure checked again. Monitor your blood pressure at home for 1 week or longer. If you are diagnosed with hypertension, you may have other blood or imaging tests to help your health care provider understand your overall risk for other conditions. How is this treated? This condition is treated by making healthy lifestyle changes, such as eating healthy foods, exercising more, and reducing your alcohol intake. You may be referred for counseling on a healthy diet and physical activity. Your health care provider may prescribe medicine if lifestyle changes are not enough to get your blood pressure under control and if: Your systolic blood pressure is above 130. Your diastolic blood pressure is above 80. Your personal target blood pressure may vary depending on your medical conditions, your age, and other factors. Follow these instructions at home: Eating and drinking  Eat a diet that is high in fiber and potassium, and low in sodium, added sugar, and fat. An example of this eating plan is called the DASH diet. DASH stands for Dietary Approaches to Stop Hypertension. To eat this way: Eat  plenty of fresh fruits and vegetables. Try to fill one half of your plate at each meal with fruits and vegetables. Eat whole grains, such as whole-wheat pasta, brown rice, or whole-grain bread. Fill about one  fourth of your plate with whole grains. Eat or drink low-fat dairy products, such as skim milk or low-fat yogurt. Avoid fatty cuts of meat, processed or cured meats, and poultry with skin. Fill about one fourth of your plate with lean proteins, such as fish, chicken without skin, beans, eggs, or tofu. Avoid pre-made and processed foods. These tend to be higher in sodium, added sugar, and fat. Reduce your daily sodium intake. Many people with hypertension should eat less than 1,500 mg of sodium a day. Do not drink alcohol if: Your health care provider tells you not to drink. You are pregnant, may be pregnant, or are planning to become pregnant. If you drink alcohol: Limit how much you have to: 0-1 drink a day for women. 0-2 drinks a day for men. Know how much alcohol is in your drink. In the [place] one drink equals one 12 oz bottle of beer (355 mL), one 5 oz glass of wine (148 mL), or one 1 oz glass of hard liquor (44 mL). Lifestyle  Work with your health care provider to maintain a healthy body weight or to lose weight. Ask what an ideal weight is for you. Get at least 30 minutes of exercise that causes your heart to beat faster (aerobic exercise) most days of the week. Activities may include walking, swimming, or biking. Include exercise to strengthen your muscles (resistance exercise), such as Pilates or lifting weights, as part of your weekly exercise routine. Try to do these types of exercises for 30 minutes at least 3 days a week. Do not use any products that contain nicotine or tobacco. These products include cigarettes, chewing tobacco, and vaping devices, such as e-cigarettes. If you need help quitting, ask your health care provider. Monitor your blood pressure at home as told by your health care provider. Keep all follow-up visits. This is important. Medicines Take over-the-counter and prescription medicines only as told by your health care provider. Follow directions carefully. Blood  pressure medicines must be taken as prescribed. Do not skip doses of blood pressure medicine. Doing this puts you at risk for problems and can make the medicine less effective. Ask your health care provider about side effects or reactions to medicines that you should watch for. Contact a health care provider if you: Think you are having a reaction to a medicine you are taking. Have headaches that keep coming back (recurring). Feel dizzy. Have swelling in your ankles. Have trouble with your vision. Get help right away if you: Develop a severe headache or confusion. Have unusual weakness or numbness. Feel faint. Have severe pain in your chest or abdomen. Vomit repeatedly. Have trouble breathing. These symptoms may be an emergency. Get help right away. Call 911. Do not wait to see if the symptoms will go away. Do not drive yourself to the hospital. Summary Hypertension is when the force of blood pumping through your arteries is too strong. If this condition is not controlled, it may put you at risk for serious complications. Your personal target blood pressure may vary depending on your medical conditions, your age, and other factors. For most people, a normal blood pressure is less than 120/80. Hypertension is treated with lifestyle changes, medicines, or a combination of both. Lifestyle changes include losing weight, eating a healthy,  low-sodium diet, exercising more, and limiting alcohol. This information is not intended to replace advice given to you by your health care provider. Make sure you discuss any questions you have with your health care provider. Document Revised: 10/29/2020 Document Reviewed: 10/29/2020 Elsevier Patient Education  2024 [employer].

## 2023-11-03 ENCOUNTER — Ambulatory Visit: Payer: Self-pay | Admitting: Internal Medicine

## 2023-11-03 LAB — TESTOSTERONE TOTAL,FREE,BIO, MALES
Albumin: 4.4 g/dL (ref 3.6–5.1)
Sex Hormone Binding: 41 nmol/L (ref 22–77)
Testosterone, Bioavailable: 342.9 ng/dL (ref 110.0–575.0)
Testosterone, Free: 170.3 pg/mL (ref 46.0–224.0)
Testosterone: 1221 ng/dL — ABNORMAL HIGH (ref 250–827)

## 2023-11-03 MED ORDER — TESTOSTERONE 50 MG/5GM (1%) TD GEL: 5.0000 g | Freq: Every day | TRANSDERMAL | 0 refills | Status: AC

## 2023-11-09 ENCOUNTER — Other Ambulatory Visit: Payer: Self-pay | Admitting: Internal Medicine

## 2023-11-09 DIAGNOSIS — F320 Major depressive disorder, single episode, mild: Secondary | ICD-10-CM

## 2023-11-09 DIAGNOSIS — F5104 Psychophysiologic insomnia: Secondary | ICD-10-CM

## 2023-12-07 ENCOUNTER — Encounter: Payer: Self-pay | Admitting: Internal Medicine

## 2023-12-13 ENCOUNTER — Other Ambulatory Visit: Payer: Self-pay | Admitting: Internal Medicine

## 2023-12-13 DIAGNOSIS — I10 Essential (primary) hypertension: Secondary | ICD-10-CM

## 2023-12-20 ENCOUNTER — Other Ambulatory Visit: Payer: Self-pay | Admitting: Internal Medicine

## 2023-12-20 DIAGNOSIS — L989 Disorder of the skin and subcutaneous tissue, unspecified: Secondary | ICD-10-CM | POA: Insufficient documentation

## 2024-01-04 ENCOUNTER — Other Ambulatory Visit: Payer: Self-pay | Admitting: Internal Medicine

## 2024-01-04 DIAGNOSIS — I10 Essential (primary) hypertension: Secondary | ICD-10-CM

## 2024-01-10 ENCOUNTER — Encounter: Payer: Self-pay | Admitting: Cardiology

## 2024-01-11 NOTE — Progress Notes (Unsigned)
 " Cardiology Office Note:    Date:  01/12/2024   ID:  Juan [name], DOB 02/19/1955, MRN [id number]  PCP:  Joshua Debby CROME, MD  Cardiologist:  Lonni CROME Nanas, MD  Electrophysiologist:  None   Referring MD: Joshua Debby CROME, MD   Chief Complaint  Patient presents with   Coronary Artery Disease   History of Present Illness:    Juan [name] is a 69 y.o. male with a hx of hypertension, hyperlipidemia who presents for follow-up.  He was referred by Dr. Joshua for evaluation of CAD, initially seen on 07/18/2019.  He underwent calcium  score on 06/23/2019, which was 1716 (97th percentile).  He reports he has been having chest pain.  Occurs about once per month.  Describes as tightness in center of his chest.  Has not noted a relationship with exertion, can occur at rest.  States it is 2-3 out of 10 in intensity and can last for several hours.  For exercise he does yoga and lifts weights about once per week.  Used to go for walks but not recently he is having knee pain and is being evaluated for knee replacement.  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  Reports he has had some fatigue dyspnea with exertion.  He denies any lightheadedness, syncope, lower extremity edema.  Reports his BP has been under good control recently. 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  Quit cigarettes 18 years ago.  Father had CABG in early 24s.    Lexiscan  Myoview  on 07/21/2019 showed large size moderate intensity fixed inferior/inferoseptal perfusion defect with normal wall motion, suggestive of artifact or less likely scar, no ischemia, LVEF 51%.  Echocardiogram on 08/03/2019 showed normal biventricular function, mild AI.  Coronary CTA 05/2023 showed severe stenosis in RCA (CTFFR borderline at 0.79), also with severe stenosis in distal RCA (CT FFR dropped to 0.63, but small vessel), calcium  score 2502 (96 percentile).  Since last clinic visit, he reports he is doing okay.  Does report  he has been having shortness of breath.  Notices it when walking upstairs.  Has not noted chest pain.  Denies any lightheadedness, syncope, lower extremity edema, or palpitations.  He lifts weights 2 days/week.   Past Medical History:  Diagnosis Date   COVID-19 04/23/2020   Hypertension    Personal history of colonic polyps - adenoma 04/24/2008   04/2008 - diminutive adenoma 06/13/2013      Past Surgical History:  Procedure Laterality Date   APPENDECTOMY  01/05/2005   ruptured   COLONOSCOPY     REPLACEMENT TOTAL KNEE Right 10/2020    Current Medications: Current Meds  Medication Sig   ASPIRIN  LOW DOSE 81 MG tablet TAKE 1 TABLET(81 MG) BY MOUTH DAILY   atorvastatin  (LIPITOR) 40 MG tablet TAKE 1 TABLET(40 MG) BY MOUTH DAILY (Patient taking differently: Take 40 mg by mouth daily.)   carvedilol  (COREG ) 3.125 MG tablet TAKE 1 TABLET(3.125 MG) BY MOUTH TWICE DAILY WITH A MEAL   indapamide  (LOZOL ) 1.25 MG tablet TAKE 1 TABLET(1.25 MG) BY MOUTH DAILY   losartan  (COZAAR ) 100 MG tablet TAKE 1 TABLET(100 MG) BY MOUTH DAILY   meloxicam  (MOBIC ) 7.5 MG tablet TAKE 1 TABLET(7.5 MG) BY MOUTH DAILY   nitroGLYCERIN  (NITROSTAT ) 0.4 MG SL tablet Place 1 tablet (0.4 mg total) under the tongue every 5 (five) minutes as needed for chest pain.   tadalafil  (CIALIS ) 5 MG tablet TAKE 1 TABLET(5 MG) BY MOUTH  DAILY AS NEEDED FOR ERECTILE DYSFUNCTION (Patient taking differently: Take 5 mg by mouth as needed for erectile dysfunction.)   testosterone  (ANDROGEL ) 50 MG/5GM (1%) GEL Place 5 g onto the skin daily.     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      Lives with wife.  Has 2 cats.,   Social Drivers of Health   Tobacco Use: Medium Risk (01/12/2024)   Patient History    Smoking Tobacco Use: Former    Smokeless Tobacco Use: Never    Passive Exposure: Not on file  Financial Resource Strain: Low Risk (11/01/2023)   Overall Financial Resource Strain (CARDIA)    Difficulty of Paying Living Expenses: Not hard at all  Food Insecurity: No Food Insecurity (11/01/2023)   Epic    Worried About Programme Researcher, Broadcasting/film/video in the Last Year: Never true    Ran Out of Food in the Last Year: Never true  Transportation Needs: No Transportation Needs (11/01/2023)   Epic    Lack of Transportation (Medical): No    Lack of Transportation (Non-Medical): No  Physical Activity: Insufficiently Active (11/01/2023)   Exercise Vital Sign    Days of Exercise per Week: 2 days    Minutes of Exercise per Session: 60 min  Stress: No Stress Concern Present (11/01/2023)   [employer] of Occupational Health - Occupational Stress Questionnaire    Feeling of Stress: Only a little  Social Connections: Moderately Integrated (11/01/2023)   Social Connection and Isolation Panel    Frequency of Communication with Friends and Family: Three times a week    Frequency of Social Gatherings with Friends and Family: Once a week    Attends Religious Services: Never    Database Administrator or Organizations: Yes    Attends Engineer, Structural: More than 4 times per year    Marital Status: Married  Depression (PHQ2-9): Low Risk (05/06/2023)   Depression (PHQ2-9)     PHQ-2 Score: 3  Alcohol Screen: Low Risk (12/18/2021)   Alcohol Screen    Last Alcohol Screening Score (AUDIT): 0  Housing: Low Risk (11/01/2023)   Epic    Unable to Pay for Housing in the Last Year: No    Number of Times Moved in the Last Year: 0    Homeless in the Last Year: No  Utilities: Not At Risk (11/09/2022)   AHC Utilities    Threatened with loss of utilities: No  Health Literacy: Not on file     Family History: The patient's family history includes Heart disease  in his father; Prostate cancer in his father. There is no history of Colon cancer, Esophageal cancer, Pancreatic cancer, or Stomach cancer.  ROS:   Please see the history of present illness.     All other systems reviewed and are negative.  EKGs/Labs/Other Studies Reviewed:    The following studies were reviewed today:   EKG:   08/28/2021: Sinus bradycardia, first-degree AV block, rate 59, no ST abnormalities  Recent Labs: 05/06/2023: ALT 20; Pro B Natriuretic peptide (BNP) 65.0; TSH 0.86 11/02/2023: BUN 22; Creatinine, Ser 1.04; Hemoglobin 14.8; Platelets 340.0; Potassium 3.9; Sodium 137  Recent Lipid Panel    Component Value Date/Time   CHOL 118 05/06/2023 0932   CHOL 130 09/28/2019 1006   TRIG 128.0 05/06/2023 0932   HDL 42.00 05/06/2023 0932   HDL 46 09/28/2019 1006   CHOLHDL 3 05/06/2023 0932   VLDL 25.6 05/06/2023 0932   LDLCALC 51 05/06/2023 0932   LDLCALC 61 09/28/2019 1006   LDLDIRECT 98.0 02/28/2018 1546    Physical Exam:    VS:  BP 138/80   Pulse 68   Ht 6' (1.829 m)   Wt 201 lb 12.8 oz (91.5 kg)   SpO2 98%   BMI 27.37 kg/m     Wt Readings from Last 3 Encounters:  01/12/24 201 lb 12.8 oz (91.5 kg)  11/02/23 201 lb 9.6 oz (91.4 kg)  07/22/23 199 lb 9.6 oz (90.5 kg)     GEN: Well nourished, well developed in no acute distress HEENT: Normal NECK: No JVD; No carotid bruits LYMPHATICS: No lymphadenopathy CARDIAC: RRR, no murmurs, rubs, gallops RESPIRATORY:  Clear to  auscultation without rales, wheezing or rhonchi  ABDOMEN: Soft, non-tender, non-distended MUSCULOSKELETAL:  No edema; No deformity  SKIN: Warm and dry NEUROLOGIC:  Alert and oriented x 3 PSYCHIATRIC:  Normal affect   ASSESSMENT:    1. Coronary artery disease involving native coronary artery of native heart, unspecified whether angina present   2. DOE (dyspnea on exertion)   3. Essential hypertension   4. Hyperlipidemia, unspecified hyperlipidemia type      PLAN:    CAD: calcium  score on 06/23/2019 was 1716 (97th percentile).  He reported atypical chest pain.  Lexiscan  Myoview  on 07/21/2019 showed large size moderate intensity fixed inferior/inferoseptal perfusion defect with normal wall motion, suggestive of artifact or less likely scar, no ischemia, LVEF 51%.  Echocardiogram on 08/03/2019 showed normal biventricular function, mild AI.   Coronary CTA 05/2023 showed severe stenosis in RCA (CTFFR borderline at 0.79), also with severe stenosis in distal RCA (CT FFR dropped to 0.63, but small vessel), calcium  score 2502 (96 percentile).   -Continue atorvastatin  40 mg daily -Continue aspirin  81 mg - He is reporting dyspnea on exertion.  Given coronary CTA results, recommend definitive evaluation with LHC.  Risks and benefits of cardiac catheterization have been discussed with the patient.  These include bleeding, infection, kidney damage, stroke, heart attack, death.  The patient understands these risks and is willing to proceed.  Hyperlipidemia: Continue atorvastatin  40 mg daily.  LDL 51 on 05/2023  Hypertension: On carvedilol  3.25 mg twice daily, losartan  100 mg daily, and indapamide .  Appears controlled  RTC in 1 month   Medication Adjustments/Labs and Tests Ordered: Current medicines are reviewed at length with the patient today.  Concerns regarding medicines are outlined above.  Orders Placed This Encounter  Procedures   Basic Metabolic Panel (BMET)   CBC   Magnesium   Meds ordered  this encounter  Medications  nitroGLYCERIN  (NITROSTAT ) 0.4 MG SL tablet    Sig: Place 1 tablet (0.4 mg total) under the tongue every 5 (five) minutes as needed for chest pain.    Dispense:  25 tablet    Refill:  3    Patient Instructions  Medication Instructions:  Please take Nitroglycerin  as ordered by your provider *If you need a refill on your cardiac medications before your next appointment, please call your pharmacy*  Lab Work: Bmet, mg, cbc today If you have labs (blood work) drawn today and your tests are completely normal, you will receive your results only by: MyChart Message (if you have MyChart) OR A paper copy in the mail If you have any lab test that is abnormal or we need to change your treatment, we will call you to review the results.  Testing/Procedures:  [facility] A DEPT OF [facility] AT [address] A DEPT OF THE . [facility] [address] [place] [address] Dept: [phone] Loc: [phone]  Zimere Dunlevy  01/12/2024  You are scheduled for a Cardiac Catheterization on Friday, January 16 with Dr. [name].  1. Please arrive at the [facility] (Main Entrance A) at [facility]: [address] [place], [place] [address] at 5:30 AM (This time is 2 hour(s) before your procedure to ensure your preparation).   Free valet parking service is available. You will check in at ADMITTING. The support person will be asked to wait in the waiting room.  It is OK to have someone drop you off and come back when you are ready to be discharged.    Special note: Every effort is made to have your procedure done on time. Please understand that emergencies sometimes delay scheduled procedures.  2. Diet: Nothing to eat after midnight.   3. Hydration: You need to be well hydrated before your procedure. On January 16, you may drink approved liquids (see below) until 2 hours before the procedure, with 16 oz of water  as your last intake.   List of approved liquids water, clear juice, clear tea, black coffee, fruit juices, non-citric and without pulp, carbonated beverages, Gatorade, Kool -Aid, plain Jello-O and plain ice popsicles.  4. Labs: labs today  5. Medication instructions in preparation for your procedure:   Contrast Allergy: No  On the morning of your procedure, take your Aspirin  81 mg and any morning medicines NOT listed above.  You may use sips of water.  6. Plan to go home the same day, you will only stay overnight if medically necessary. 7. Bring a current list of your medications and current insurance cards. 8. You MUST have a responsible person to drive you home. 9. Someone MUST be with you the first 24 hours after you arrive home or your discharge will be delayed. 10. Please wear clothes that are easy to get on and off and wear slip-on shoes.  Thank you for allowing us  to care for you!   -- [facility]   Follow-Up: At [facility], you and your health needs are our priority.  As part of our continuing mission to provide you with exceptional heart care, our providers are all part of one team.  This team includes your primary Cardiologist (physician) and Advanced Practice Providers or APPs (Physician Assistants and Nurse Practitioners) who all work together to provide you with the care you need, when you need it.  Your next appointment:  Feb 2  Provider:   Dr. Kate  We recommend signing up for the patient portal called MyChart.  Sign up information is provided on this After Visit Summary.  MyChart is used to connect with patients for Virtual Visits (Telemedicine).  Patients are able to view lab/test results, encounter notes, upcoming appointments, etc.  Non-urgent messages can be sent to your provider as well.   To learn more about what you can do with MyChart, go to [email or web address].   Other Instructions none            Signed, Lonni LITTIE Kate, MD  01/12/2024 9:05 AM    [facility] "

## 2024-01-11 NOTE — H&P (View-Only) (Signed)
 " Cardiology Office Note:    Date:  01/12/2024   ID:  Juan [name], DOB 05-Feb-1955, MRN [id number]  PCP:  Joshua Debby CROME, MD  Cardiologist:  Lonni CROME Nanas, MD  Electrophysiologist:  None   Referring MD: Joshua Debby CROME, MD   Chief Complaint  Patient presents with   Coronary Artery Disease   History of Present Illness:    Juan [name] is a 69 y.o. male with a hx of hypertension, hyperlipidemia who presents for follow-up.  He was referred by Dr. Joshua for evaluation of CAD, initially seen on 07/18/2019.  He underwent calcium  score on 06/23/2019, which was 1716 (97th percentile).  He reports he has been having chest pain.  Occurs about once per month.  Describes as tightness in center of his chest.  Has not noted a relationship with exertion, can occur at rest.  States it is 2-3 out of 10 in intensity and can last for several hours.  For exercise he does yoga and lifts weights about once per week.  Used to go for walks but not recently he is having knee pain and is being evaluated for knee replacement.  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  Reports he has had some fatigue dyspnea with exertion.  He denies any lightheadedness, syncope, lower extremity edema.  Reports his BP has been under good control recently. 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  Quit cigarettes 18 years ago.  Father had CABG in early 63s.    Lexiscan  Myoview  on 07/21/2019 showed large size moderate intensity fixed inferior/inferoseptal perfusion defect with normal wall motion, suggestive of artifact or less likely scar, no ischemia, LVEF 51%.  Echocardiogram on 08/03/2019 showed normal biventricular function, mild AI.  Coronary CTA 05/2023 showed severe stenosis in RCA (CTFFR borderline at 0.79), also with severe stenosis in distal RCA (CT FFR dropped to 0.63, but small vessel), calcium  score 2502 (96 percentile).  Since last clinic visit, he reports he is doing okay.  Does report  he has been having shortness of breath.  Notices it when walking upstairs.  Has not noted chest pain.  Denies any lightheadedness, syncope, lower extremity edema, or palpitations.  He lifts weights 2 days/week.   Past Medical History:  Diagnosis Date   COVID-19 04/23/2020   Hypertension    Personal history of colonic polyps - adenoma 04/24/2008   04/2008 - diminutive adenoma 06/13/2013      Past Surgical History:  Procedure Laterality Date   APPENDECTOMY  01/05/2005   ruptured   COLONOSCOPY     REPLACEMENT TOTAL KNEE Right 10/2020    Current Medications: Current Meds  Medication Sig   ASPIRIN  LOW DOSE 81 MG tablet TAKE 1 TABLET(81 MG) BY MOUTH DAILY   atorvastatin  (LIPITOR) 40 MG tablet TAKE 1 TABLET(40 MG) BY MOUTH DAILY (Patient taking differently: Take 40 mg by mouth daily.)   carvedilol  (COREG ) 3.125 MG tablet TAKE 1 TABLET(3.125 MG) BY MOUTH TWICE DAILY WITH A MEAL   indapamide  (LOZOL ) 1.25 MG tablet TAKE 1 TABLET(1.25 MG) BY MOUTH DAILY   losartan  (COZAAR ) 100 MG tablet TAKE 1 TABLET(100 MG) BY MOUTH DAILY   meloxicam  (MOBIC ) 7.5 MG tablet TAKE 1 TABLET(7.5 MG) BY MOUTH DAILY   nitroGLYCERIN  (NITROSTAT ) 0.4 MG SL tablet Place 1 tablet (0.4 mg total) under the tongue every 5 (five) minutes as needed for chest pain.   tadalafil  (CIALIS ) 5 MG tablet TAKE 1 TABLET(5 MG) BY MOUTH  DAILY AS NEEDED FOR ERECTILE DYSFUNCTION (Patient taking differently: Take 5 mg by mouth as needed for erectile dysfunction.)   testosterone  (ANDROGEL ) 50 MG/5GM (1%) GEL Place 5 g onto the skin daily.     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      Lives with wife.  Has 2 cats.,   Social Drivers of Health   Tobacco Use: Medium Risk (01/12/2024)   Patient History    Smoking Tobacco Use: Former    Smokeless Tobacco Use: Never    Passive Exposure: Not on file  Financial Resource Strain: Low Risk (11/01/2023)   Overall Financial Resource Strain (CARDIA)    Difficulty of Paying Living Expenses: Not hard at all  Food Insecurity: No Food Insecurity (11/01/2023)   Epic    Worried About Programme Researcher, Broadcasting/film/video in the Last Year: Never true    Ran Out of Food in the Last Year: Never true  Transportation Needs: No Transportation Needs (11/01/2023)   Epic    Lack of Transportation (Medical): No    Lack of Transportation (Non-Medical): No  Physical Activity: Insufficiently Active (11/01/2023)   Exercise Vital Sign    Days of Exercise per Week: 2 days    Minutes of Exercise per Session: 60 min  Stress: No Stress Concern Present (11/01/2023)   [employer] of Occupational Health - Occupational Stress Questionnaire    Feeling of Stress: Only a little  Social Connections: Moderately Integrated (11/01/2023)   Social Connection and Isolation Panel    Frequency of Communication with Friends and Family: Three times a week    Frequency of Social Gatherings with Friends and Family: Once a week    Attends Religious Services: Never    Database Administrator or Organizations: Yes    Attends Engineer, Structural: More than 4 times per year    Marital Status: Married  Depression (PHQ2-9): Low Risk (05/06/2023)   Depression (PHQ2-9)     PHQ-2 Score: 3  Alcohol Screen: Low Risk (12/18/2021)   Alcohol Screen    Last Alcohol Screening Score (AUDIT): 0  Housing: Low Risk (11/01/2023)   Epic    Unable to Pay for Housing in the Last Year: No    Number of Times Moved in the Last Year: 0    Homeless in the Last Year: No  Utilities: Not At Risk (11/09/2022)   AHC Utilities    Threatened with loss of utilities: No  Health Literacy: Not on file     Family History: The patient's family history includes Heart disease  in his father; Prostate cancer in his father. There is no history of Colon cancer, Esophageal cancer, Pancreatic cancer, or Stomach cancer.  ROS:   Please see the history of present illness.     All other systems reviewed and are negative.  EKGs/Labs/Other Studies Reviewed:    The following studies were reviewed today:   EKG:   08/28/2021: Sinus bradycardia, first-degree AV block, rate 59, no ST abnormalities  Recent Labs: 05/06/2023: ALT 20; Pro B Natriuretic peptide (BNP) 65.0; TSH 0.86 11/02/2023: BUN 22; Creatinine, Ser 1.04; Hemoglobin 14.8; Platelets 340.0; Potassium 3.9; Sodium 137  Recent Lipid Panel    Component Value Date/Time   CHOL 118 05/06/2023 0932   CHOL 130 09/28/2019 1006   TRIG 128.0 05/06/2023 0932   HDL 42.00 05/06/2023 0932   HDL 46 09/28/2019 1006   CHOLHDL 3 05/06/2023 0932   VLDL 25.6 05/06/2023 0932   LDLCALC 51 05/06/2023 0932   LDLCALC 61 09/28/2019 1006   LDLDIRECT 98.0 02/28/2018 1546    Physical Exam:    VS:  BP 138/80   Pulse 68   Ht 6' (1.829 m)   Wt 201 lb 12.8 oz (91.5 kg)   SpO2 98%   BMI 27.37 kg/m     Wt Readings from Last 3 Encounters:  01/12/24 201 lb 12.8 oz (91.5 kg)  11/02/23 201 lb 9.6 oz (91.4 kg)  07/22/23 199 lb 9.6 oz (90.5 kg)     GEN: Well nourished, well developed in no acute distress HEENT: Normal NECK: No JVD; No carotid bruits LYMPHATICS: No lymphadenopathy CARDIAC: RRR, no murmurs, rubs, gallops RESPIRATORY:  Clear to  auscultation without rales, wheezing or rhonchi  ABDOMEN: Soft, non-tender, non-distended MUSCULOSKELETAL:  No edema; No deformity  SKIN: Warm and dry NEUROLOGIC:  Alert and oriented x 3 PSYCHIATRIC:  Normal affect   ASSESSMENT:    1. Coronary artery disease involving native coronary artery of native heart, unspecified whether angina present   2. DOE (dyspnea on exertion)   3. Essential hypertension   4. Hyperlipidemia, unspecified hyperlipidemia type      PLAN:    CAD: calcium  score on 06/23/2019 was 1716 (97th percentile).  He reported atypical chest pain.  Lexiscan  Myoview  on 07/21/2019 showed large size moderate intensity fixed inferior/inferoseptal perfusion defect with normal wall motion, suggestive of artifact or less likely scar, no ischemia, LVEF 51%.  Echocardiogram on 08/03/2019 showed normal biventricular function, mild AI.   Coronary CTA 05/2023 showed severe stenosis in RCA (CTFFR borderline at 0.79), also with severe stenosis in distal RCA (CT FFR dropped to 0.63, but small vessel), calcium  score 2502 (96 percentile).   -Continue atorvastatin  40 mg daily -Continue aspirin  81 mg - He is reporting dyspnea on exertion.  Given coronary CTA results, recommend definitive evaluation with LHC.  Risks and benefits of cardiac catheterization have been discussed with the patient.  These include bleeding, infection, kidney damage, stroke, heart attack, death.  The patient understands these risks and is willing to proceed.  Hyperlipidemia: Continue atorvastatin  40 mg daily.  LDL 51 on 05/2023  Hypertension: On carvedilol  3.25 mg twice daily, losartan  100 mg daily, and indapamide .  Appears controlled  RTC in 1 month   Medication Adjustments/Labs and Tests Ordered: Current medicines are reviewed at length with the patient today.  Concerns regarding medicines are outlined above.  Orders Placed This Encounter  Procedures   Basic Metabolic Panel (BMET)   CBC   Magnesium   Meds ordered  this encounter  Medications  nitroGLYCERIN  (NITROSTAT ) 0.4 MG SL tablet    Sig: Place 1 tablet (0.4 mg total) under the tongue every 5 (five) minutes as needed for chest pain.    Dispense:  25 tablet    Refill:  3    Patient Instructions  Medication Instructions:  Please take Nitroglycerin  as ordered by your provider *If you need a refill on your cardiac medications before your next appointment, please call your pharmacy*  Lab Work: Bmet, mg, cbc today If you have labs (blood work) drawn today and your tests are completely normal, you will receive your results only by: MyChart Message (if you have MyChart) OR A paper copy in the mail If you have any lab test that is abnormal or we need to change your treatment, we will call you to review the results.  Testing/Procedures:  [place] [facility] A DEPT OF [facility] AT [address] A DEPT OF THE [facility] [address] [place] [address] Dept: [phone] Loc: [phone]  Deepak Bless  01/12/2024  You are scheduled for a Cardiac Catheterization on Friday, January 16 with Dr. [name].  1. Please arrive at the [facility] (Main Entrance A) at [facility]: [address] [place], [place] [address] at 5:30 AM (This time is 2 hour(s) before your procedure to ensure your preparation).   Free valet parking service is available. You will check in at ADMITTING. The support person will be asked to wait in the waiting room.  It is OK to have someone drop you off and come back when you are ready to be discharged.    Special note: Every effort is made to have your procedure done on time. Please understand that emergencies sometimes delay scheduled procedures.  2. Diet: Nothing to eat after midnight.   3. Hydration: You need to be well hydrated before your procedure. On January 16, you may drink approved liquids (see below) until 2 hours before the procedure, with 16 oz of water   as your last intake.   List of approved liquids water , clear juice, clear tea, black coffee, fruit juices, non-citric and without pulp, carbonated beverages, Gatorade, Kool -Aid, plain Jello-O and plain ice popsicles.  4. Labs: labs today  5. Medication instructions in preparation for your procedure:   Contrast Allergy: No  On the morning of your procedure, take your Aspirin  81 mg and any morning medicines NOT listed above.  You may use sips of water .  6. Plan to go home the same day, you will only stay overnight if medically necessary. 7. Bring a current list of your medications and current insurance cards. 8. You MUST have a responsible person to drive you home. 9. Someone MUST be with you the first 24 hours after you arrive home or your discharge will be delayed. 10. Please wear clothes that are easy to get on and off and wear slip-on shoes.  Thank you for allowing us  to care for you!   -- [place] [facility]   Follow-Up: At [facility], you and your health needs are our priority.  As part of our continuing mission to provide you with exceptional heart care, our providers are all part of one team.  This team includes your primary Cardiologist (physician) and Advanced Practice Providers or APPs (Physician Assistants and Nurse Practitioners) who all work together to provide you with the care you need, when you need it.  Your next appointment:  Feb 2  Provider:   Dr. Kate  We recommend signing up for the patient portal called MyChart.  Sign up information is provided on this After Visit Summary.  MyChart is used to connect with patients for Virtual Visits (Telemedicine).  Patients are able to view lab/test results, encounter notes, upcoming appointments, etc.  Non-urgent messages can be sent to your provider as well.   To learn more about what you can do with MyChart, go to [email or web address].   Other Instructions none            Signed, Lonni LITTIE Kate, MD  01/12/2024 9:05 AM    [facility] "

## 2024-01-12 ENCOUNTER — Encounter: Payer: Self-pay | Admitting: Cardiology

## 2024-01-12 ENCOUNTER — Ambulatory Visit: Admitting: Cardiology

## 2024-01-12 VITALS — BP 138/80 | HR 68 | Ht 72.0 in | Wt 201.8 lb

## 2024-01-12 DIAGNOSIS — R0609 Other forms of dyspnea: Secondary | ICD-10-CM | POA: Diagnosis not present

## 2024-01-12 DIAGNOSIS — E785 Hyperlipidemia, unspecified: Secondary | ICD-10-CM

## 2024-01-12 DIAGNOSIS — I10 Essential (primary) hypertension: Secondary | ICD-10-CM | POA: Diagnosis not present

## 2024-01-12 DIAGNOSIS — I2510 Atherosclerotic heart disease of native coronary artery without angina pectoris: Secondary | ICD-10-CM

## 2024-01-12 LAB — CBC
Hematocrit: 45.7 % (ref 37.5–51.0)
Hemoglobin: 15.2 g/dL (ref 13.0–17.7)
MCH: 30.1 pg (ref 26.6–33.0)
MCHC: 33.3 g/dL (ref 31.5–35.7)
MCV: 91 fL (ref 79–97)
Platelets: 327 x10E3/uL (ref 150–450)
RBC: 5.05 x10E6/uL (ref 4.14–5.80)
RDW: 13.3 % (ref 11.6–15.4)
WBC: 5.1 x10E3/uL (ref 3.4–10.8)

## 2024-01-12 LAB — BASIC METABOLIC PANEL WITH GFR
BUN/Creatinine Ratio: 20 (ref 10–24)
BUN: 22 mg/dL (ref 8–27)
CO2: 23 mmol/L (ref 20–29)
Calcium: 9.7 mg/dL (ref 8.6–10.2)
Chloride: 101 mmol/L (ref 96–106)
Creatinine, Ser: 1.1 mg/dL (ref 0.76–1.27)
Glucose: 99 mg/dL (ref 70–99)
Potassium: 4.4 mmol/L (ref 3.5–5.2)
Sodium: 141 mmol/L (ref 134–144)
eGFR: 73 mL/min/1.73

## 2024-01-12 LAB — MAGNESIUM: Magnesium: 2.1 mg/dL (ref 1.6–2.3)

## 2024-01-12 MED ORDER — NITROGLYCERIN 0.4 MG SL SUBL: 0.4000 mg | SUBLINGUAL_TABLET | SUBLINGUAL | 3 refills | Status: AC | PRN

## 2024-01-12 NOTE — Patient Instructions (Addendum)
 Medication Instructions:  Please take Nitroglycerin  as ordered by your provider *If you need a refill on your cardiac medications before your next appointment, please call your pharmacy*  Lab Work: Bmet, mg, cbc today If you have labs (blood work) drawn today and your tests are completely normal, you will receive your results only by: MyChart Message (if you have MyChart) OR A paper copy in the mail If you have any lab test that is abnormal or we need to change your treatment, we will call you to review the results.  Testing/Procedures:  [place] [facility] A DEPT OF [facility]ARTCARE AT [address] A DEPT OF THE [facility] [address] [place] [address] Dept: [phone] Loc: [phone]  Juan [name]  01/12/2024  You are scheduled for a Cardiac Catheterization on Friday, January 16 with Dr. [name].  1. Please arrive at the [facility] (Main Entrance A) at [facility]: [address] [place], [place] [address] at 5:30 AM (This time is 2 hour(s) before your procedure to ensure your preparation).   Free valet parking service is available. You will check in at ADMITTING. The support person will be asked to wait in the waiting room.  It is OK to have someone drop you off and come back when you are ready to be discharged.    Special note: Every effort is made to have your procedure done on time. Please understand that emergencies sometimes delay scheduled procedures.  2. Diet: Nothing to eat after midnight.   3. Hydration: You need to be well hydrated before your procedure. On January 16, you may drink approved liquids (see below) until 2 hours before the procedure, with 16 oz of water as your last intake.   List of approved liquids water, clear juice, clear tea, black coffee, fruit juices, non-citric and without pulp, carbonated beverages, Gatorade, Kool -Aid, plain Jello-O and plain ice popsicles.  4. Labs: labs today  5.  Medication instructions in preparation for your procedure:   Contrast Allergy: No  On the morning of your procedure, take your Aspirin  81 mg and any morning medicines NOT listed above.  You may use sips of water.  6. Plan to go home the same day, you will only stay overnight if medically necessary. 7. Bring a current list of your medications and current insurance cards. 8. You MUST have a responsible person to drive you home. 9. Someone MUST be with you the first 24 hours after you arrive home or your discharge will be delayed. 10. Please wear clothes that are easy to get on and off and wear slip-on shoes.  Thank you for allowing us  to care for you!   -- [place] [facility]   Follow-Up: At [facility], you and your health needs are our priority.  As part of our continuing mission to provide you with exceptional heart care, our providers are all part of one team.  This team includes your primary Cardiologist (physician) and Advanced Practice Providers or APPs (Physician Assistants and Nurse Practitioners) who all work together to provide you with the care you need, when you need it.  Your next appointment:   Feb 2  Provider:   Dr. Kate  We recommend signing up for the patient portal called MyChart.  Sign up information is provided on this After Visit Summary.  MyChart is used to connect with patients for Virtual Visits (Telemedicine).  Patients are able to  view lab/test results, encounter notes, upcoming appointments, etc.  Non-urgent messages can be sent to your provider as well.   To learn more about what you can do with MyChart, go to [email or web address].   Other Instructions none

## 2024-01-13 ENCOUNTER — Ambulatory Visit: Payer: Self-pay | Admitting: Cardiology

## 2024-01-14 ENCOUNTER — Other Ambulatory Visit: Payer: Self-pay | Admitting: Internal Medicine

## 2024-01-14 ENCOUNTER — Other Ambulatory Visit: Payer: Self-pay | Admitting: Cardiology

## 2024-01-14 DIAGNOSIS — I10 Essential (primary) hypertension: Secondary | ICD-10-CM

## 2024-01-19 ENCOUNTER — Telehealth: Payer: Self-pay | Admitting: *Deleted

## 2024-01-19 NOTE — Telephone Encounter (Signed)
 Cardiac Catheterization scheduled at [facility] for: Friday January 21, 2024 7:30 AM Arrival time [facility] Main Entrance A at: 5:30 AM  Diet: -Nothing to eat after midnight.  Hydration: -May drink clear liquids until 2 hours before the procedure.  Approved liquids: Water , clear tea, black coffee, fruit juices-non-citric and without pulp,Gatorade, plain Jello/popsicles.   -Please drink 16 oz of water  2 hours before procedure.  Medication instructions: -Usual morning medications can be taken including aspirin  81 mg.  Plan to go home the same day, you will only stay overnight if medically necessary.  You must have responsible adult to drive you home.  Someone must be with you the first 24 hours after you arrive home.  Reviewed procedure instructions with patient.

## 2024-01-21 ENCOUNTER — Other Ambulatory Visit: Payer: Self-pay

## 2024-01-21 ENCOUNTER — Encounter (HOSPITAL_COMMUNITY): Admission: RE | Disposition: A | Payer: Self-pay | Source: Home / Self Care | Attending: Cardiovascular Disease

## 2024-01-21 ENCOUNTER — Ambulatory Visit (HOSPITAL_COMMUNITY)
Admission: RE | Admit: 2024-01-21 | Discharge: 2024-01-21 | Disposition: A | Discharge status: Home / Self Care | Attending: Cardiovascular Disease | Admitting: Cardiovascular Disease

## 2024-01-21 DIAGNOSIS — R931 Abnormal findings on diagnostic imaging of heart and coronary circulation: Secondary | ICD-10-CM | POA: Insufficient documentation

## 2024-01-21 DIAGNOSIS — I10 Essential (primary) hypertension: Secondary | ICD-10-CM | POA: Insufficient documentation

## 2024-01-21 DIAGNOSIS — Z87891 Personal history of nicotine dependence: Secondary | ICD-10-CM | POA: Insufficient documentation

## 2024-01-21 DIAGNOSIS — I2584 Coronary atherosclerosis due to calcified coronary lesion: Secondary | ICD-10-CM | POA: Diagnosis not present

## 2024-01-21 DIAGNOSIS — Z7982 Long term (current) use of aspirin: Secondary | ICD-10-CM | POA: Diagnosis not present

## 2024-01-21 DIAGNOSIS — I2510 Atherosclerotic heart disease of native coronary artery without angina pectoris: Secondary | ICD-10-CM | POA: Diagnosis not present

## 2024-01-21 DIAGNOSIS — Z79899 Other long term (current) drug therapy: Secondary | ICD-10-CM | POA: Insufficient documentation

## 2024-01-21 DIAGNOSIS — E785 Hyperlipidemia, unspecified: Secondary | ICD-10-CM | POA: Insufficient documentation

## 2024-01-21 HISTORY — PX: LEFT HEART CATH AND CORONARY ANGIOGRAPHY: CATH118249

## 2024-01-21 MED ORDER — IOHEXOL 350 MG/ML SOLN
INTRAVENOUS | Status: DC | PRN
  Administered 2024-01-21: 59 mL

## 2024-01-21 MED ORDER — LIDOCAINE HCL (PF) 1 % IJ SOLN
INTRAMUSCULAR | Status: DC | PRN
  Administered 2024-01-21: 2 mL

## 2024-01-21 MED ORDER — SODIUM CHLORIDE 0.9% FLUSH: 3.0000 mL | INTRAVENOUS | Status: DC | PRN

## 2024-01-21 MED ORDER — FENTANYL CITRATE (PF) 100 MCG/2ML IJ SOLN
INTRAMUSCULAR | Status: AC
  Filled 2024-01-21: qty 2

## 2024-01-21 MED ORDER — LIDOCAINE HCL (PF) 1 % IJ SOLN
INTRAMUSCULAR | Status: AC
  Filled 2024-01-21: qty 30

## 2024-01-21 MED ORDER — ASPIRIN 81 MG PO CHEW: 81.0000 mg | CHEWABLE_TABLET | ORAL | Status: AC

## 2024-01-21 MED ORDER — SODIUM CHLORIDE 0.9% FLUSH: 3.0000 mL | Freq: Two times a day (BID) | INTRAVENOUS | Status: DC

## 2024-01-21 MED ORDER — VERAPAMIL HCL 2.5 MG/ML IV SOLN
INTRAVENOUS | Status: AC
  Filled 2024-01-21: qty 2

## 2024-01-21 MED ORDER — SODIUM CHLORIDE 0.9 % IV SOLN: 250.0000 mL | INTRAVENOUS | Status: DC | PRN

## 2024-01-21 MED ORDER — FREE WATER: 500.0000 mL | Freq: Once | Status: DC

## 2024-01-21 MED ORDER — FENTANYL CITRATE (PF) 100 MCG/2ML IJ SOLN
INTRAMUSCULAR | Status: DC | PRN
  Administered 2024-01-21: 25 ug via INTRAVENOUS

## 2024-01-21 MED ORDER — HEPARIN SODIUM (PORCINE) 1000 UNIT/ML IJ SOLN
INTRAMUSCULAR | Status: AC
  Filled 2024-01-21: qty 10

## 2024-01-21 MED ORDER — MIDAZOLAM HCL (PF) 2 MG/2ML IJ SOLN
INTRAMUSCULAR | Status: DC | PRN
  Administered 2024-01-21: 2 mg via INTRAVENOUS

## 2024-01-21 MED ORDER — HYDRALAZINE HCL 20 MG/ML IJ SOLN: 10.0000 mg | INTRAMUSCULAR | Status: DC | PRN

## 2024-01-21 MED ORDER — LABETALOL HCL 5 MG/ML IV SOLN: 10.0000 mg | INTRAVENOUS | Status: DC | PRN

## 2024-01-21 MED ORDER — HEPARIN SODIUM (PORCINE) 1000 UNIT/ML IJ SOLN
INTRAMUSCULAR | Status: DC | PRN
  Administered 2024-01-21: 5000 [IU] via INTRAVENOUS

## 2024-01-21 MED ORDER — HEPARIN (PORCINE) IN NACL 1000-0.9 UT/500ML-% IV SOLN
INTRAVENOUS | Status: DC | PRN
  Administered 2024-01-21: 1000 mL

## 2024-01-21 MED ORDER — ACETAMINOPHEN 325 MG PO TABS: 650.0000 mg | ORAL_TABLET | ORAL | Status: DC | PRN

## 2024-01-21 MED ORDER — VERAPAMIL HCL 2.5 MG/ML IV SOLN
INTRAVENOUS | Status: DC | PRN
  Administered 2024-01-21: 10 mL via INTRA_ARTERIAL

## 2024-01-21 MED ORDER — ONDANSETRON HCL 4 MG/2ML IJ SOLN: 4.0000 mg | Freq: Four times a day (QID) | INTRAMUSCULAR | Status: DC | PRN

## 2024-01-21 MED ORDER — MIDAZOLAM HCL 2 MG/2ML IJ SOLN
INTRAMUSCULAR | Status: AC
  Filled 2024-01-21: qty 2

## 2024-01-21 NOTE — Progress Notes (Signed)
 Patient tolerating regular diet without emesis. Wife at the bedside providing support. Pain 0/10. No bleeding noted at [facility]

## 2024-01-21 NOTE — Progress Notes (Signed)
 Wife at the bedside, assist patient to [facility] for 2nd void. Patient with steady gait.

## 2024-01-21 NOTE — Discharge Instructions (Signed)

## 2024-01-21 NOTE — Progress Notes (Signed)
 Discharge instructions reviewed with patient and wife, copy given. Questions asked and answered.

## 2024-01-21 NOTE — Interval H&P Note (Signed)
 History and Physical Interval Note:  01/21/2024 6:08 AM  Juan [name]  has presented today for surgery, with the diagnosis of abnormal ct.  The various methods of treatment have been discussed with the patient and family. After consideration of risks, benefits and other options for treatment, the patient has consented to  Procedures: LEFT HEART CATH AND CORONARY ANGIOGRAPHY (N/A) as a surgical intervention.  The patient's history has been reviewed, patient examined, no change in status, stable for surgery.  I have reviewed the patient's chart and labs.  Questions were answered to the patient's satisfaction.     [name]

## 2024-01-21 NOTE — Progress Notes (Signed)
 TRB removed - no active bleeding noted, no hematoma. RB-A

## 2024-01-21 NOTE — Progress Notes (Signed)
 Patient ready for discharge, wife will transport patient home via private vehicle.

## 2024-01-22 ENCOUNTER — Encounter (HOSPITAL_COMMUNITY): Payer: Self-pay | Admitting: Cardiovascular Disease

## 2024-02-07 ENCOUNTER — Ambulatory Visit: Admitting: Cardiology

## 2024-02-07 ENCOUNTER — Encounter: Payer: Self-pay | Admitting: Cardiology

## 2024-02-07 ENCOUNTER — Other Ambulatory Visit: Payer: Self-pay | Admitting: Internal Medicine

## 2024-02-07 VITALS — BP 118/72 | HR 61 | Ht 72.0 in | Wt 202.0 lb

## 2024-02-07 DIAGNOSIS — I10 Essential (primary) hypertension: Secondary | ICD-10-CM | POA: Diagnosis not present

## 2024-02-07 DIAGNOSIS — E785 Hyperlipidemia, unspecified: Secondary | ICD-10-CM

## 2024-02-07 DIAGNOSIS — I2510 Atherosclerotic heart disease of native coronary artery without angina pectoris: Secondary | ICD-10-CM

## 2024-02-07 DIAGNOSIS — R0602 Shortness of breath: Secondary | ICD-10-CM

## 2024-02-07 NOTE — Patient Instructions (Signed)
 Medication Instructions:  Your physician recommends that you continue on your current medications as directed. Please refer to the Current Medication list given to you today.  *If you need a refill on your cardiac medications before your next appointment, please call your pharmacy*  Testing/Procedures: Your physician has requested that you have an echocardiogram. Echocardiography is a painless test that uses sound waves to create images of your heart. It provides your doctor with information about the size and shape of your heart and how well your hearts chambers and valves are working. This procedure takes approximately one hour. There are no restrictions for this procedure. Please do NOT wear cologne, perfume, aftershave, or lotions (deodorant is allowed). Please arrive 15 minutes prior to your appointment time.  Please note: We ask at that you not bring children with you during ultrasound (echo/ vascular) testing. Due to room size and safety concerns, children are not allowed in the ultrasound rooms during exams. Our front office staff cannot provide observation of children in our lobby area while testing is being conducted. An adult accompanying a patient to their appointment will only be allowed in the ultrasound room at the discretion of the ultrasound technician under special circumstances. We apologize for any inconvenience.   Follow-Up: At [facility], you and your health needs are our priority.  As part of our continuing mission to provide you with exceptional heart care, our providers are all part of one team.  This team includes your primary Cardiologist (physician) and Advanced Practice Providers or APPs (Physician Assistants and Nurse Practitioners) who all work together to provide you with the care you need, when you need it.  Your next appointment:   6 month(s)  Provider:   Lonni LITTIE Nanas, MD

## 2024-02-07 NOTE — Progress Notes (Signed)
 " Cardiology Office Note:    Date:  02/07/2024   ID:  Juan [name], DOB [birth year], MRN [id number]  PCP:  Joshua Debby CROME, MD  Cardiologist:  Lonni CROME Nanas, MD  Electrophysiologist:  None   Referring MD: Joshua Debby CROME, MD   Chief Complaint  Patient presents with   Shortness of Breath   History of Present Illness:    Juan [name] is a 69 y.o. male with a hx of hypertension, hyperlipidemia who presents for follow-up.  He was referred by Dr. Joshua for evaluation of CAD, initially seen on 07/18/2019.  He underwent calcium  score on 06/23/2019, which was 1716 (97th percentile).  He reports he has been having chest pain.  Occurs about once per month.  Describes as tightness in center of his chest.  Has not noted a relationship with exertion, can occur at rest.  States it is 2-3 out of 10 in intensity and can last for several hours.  For exercise he does yoga and lifts weights about once per week.  Used to go for walks but not recently he is having knee pain and is being evaluated for knee replacement.  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  Reports he has had some fatigue dyspnea with exertion.  He denies any lightheadedness, syncope, lower extremity edema.  Reports his BP has been under good control recently. 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  Quit cigarettes 18 years ago.  Father had CABG in early 77s.    Lexiscan  Myoview  on 07/21/2019 showed large size moderate intensity fixed inferior/inferoseptal perfusion defect with normal wall motion, suggestive of artifact or less likely scar, no ischemia, LVEF 51%.  Echocardiogram on 08/03/2019 showed normal biventricular function, mild AI.  Coronary CTA 05/2023 showed severe stenosis in RCA (CTFFR borderline at 0.79), also with severe stenosis in distal RCA (CT FFR dropped to 0.63, but small vessel), calcium  score 2502 (96 percentile).  LHC 01/21/2024 showed 50% mid RCA stenosis with aneurysmal segment of  first PL branch but no significant stenosis.  Since last clinic visit, he reports he is doing okay.  Continues to have dyspnea with exertion.  Denies any chest pain.  States that dyspnea is not limiting his work.  Does report some lightheadedness if standing too quickly.  Denies any syncope.  Denies any lower extremity edema.   Past Medical History:  Diagnosis Date   COVID-19 04/23/2020   Hypertension    Personal history of colonic polyps - adenoma 04/24/2008   04/2008 - diminutive adenoma 06/13/2013      Past Surgical History:  Procedure Laterality Date   APPENDECTOMY  01/05/2005   ruptured   COLONOSCOPY     LEFT HEART CATH AND CORONARY ANGIOGRAPHY N/A 01/21/2024   Procedure: LEFT HEART CATH AND CORONARY ANGIOGRAPHY;  Surgeon: Wonda Sharper, MD;  Location: [facility] INVASIVE CV LAB;  Service: Cardiovascular;  Laterality: N/A;   REPLACEMENT TOTAL KNEE Right 10/2020    Current Medications: Current Meds  Medication Sig   ASPIRIN  LOW DOSE 81 MG tablet TAKE 1 TABLET(81 MG) BY MOUTH DAILY   atorvastatin  (LIPITOR) 40 MG tablet TAKE 1 TABLET(40 MG) BY MOUTH DAILY   carvedilol  (COREG ) 3.125 MG tablet TAKE 1 TABLET(3.125 MG) BY MOUTH TWICE DAILY WITH A MEAL   indapamide  (LOZOL ) 1.25 MG tablet TAKE 1 TABLET(1.25 MG) BY MOUTH DAILY   losartan  (COZAAR ) 100 MG tablet TAKE 1 TABLET(100 MG) BY MOUTH DAILY   meloxicam  (MOBIC ) 7.5  MG tablet TAKE 1 TABLET(7.5 MG) BY MOUTH DAILY (Patient taking differently: Take 7.5 mg by mouth daily as needed for pain.)   nitroGLYCERIN  (NITROSTAT ) 0.4 MG SL tablet Place 1 tablet (0.4 mg total) under the tongue every 5 (five) minutes as needed for chest pain.   SERMORELIN ACETATE DIAGNOSTIC IJ Inject 2 mg as directed See admin instructions. Monday-Friday   tadalafil  (CIALIS ) 5 MG tablet TAKE 1 TABLET(5 MG) BY MOUTH DAILY AS NEEDED FOR ERECTILE DYSFUNCTION   testosterone  (ANDROGEL ) 50 MG/5GM (1%) GEL Place 5 g onto the skin daily.   trazodone  (DESYREL ) 300 MG tablet TAKE 1  TABLET(300 MG) BY MOUTH AT BEDTIME     Allergies:   Ace inhibitors   Social History   Socioeconomic History   Marital status: Married    Spouse name: [name]   Number of children: 1   Years of education: Not on file   Highest education level: 12th grade  Occupational History   Not on file  Tobacco Use   Smoking status: Former    Current packs/day: 0.00    Types: Cigarettes    Quit date: 01/06/1999    Years since quitting: 25.1   Smokeless tobacco: Never  Vaping Use   Vaping status: Never Used  Substance and Sexual Activity   Alcohol use: No   Drug use: No    Comment: 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      Lives with wife.  Has 2 cats.,   Social Drivers of Health   Tobacco Use: Medium Risk (02/07/2024)   Patient History    Smoking Tobacco Use: Former    Smokeless Tobacco Use: Never    Passive Exposure: Not on file  Financial Resource Strain: Low Risk (11/01/2023)   Overall Financial Resource Strain (CARDIA)    Difficulty of Paying Living Expenses: Not hard at all  Food Insecurity: No Food Insecurity (11/01/2023)   Epic    Worried About Programme Researcher, Broadcasting/film/video in the Last Year: Never true    Ran Out of Food in the Last Year: Never true  Transportation Needs: No Transportation Needs (11/01/2023)   Epic    Lack of Transportation (Medical): No    Lack of Transportation (Non-Medical): No  Physical Activity: Insufficiently Active (11/01/2023)   Exercise Vital Sign    Days of Exercise per Week: 2 days    Minutes of Exercise per Session: 60 min  Stress: No Stress Concern Present (11/01/2023)   [employer] of Occupational Health - Occupational Stress Questionnaire    Feeling of Stress: Only a little  Social Connections: Moderately Integrated  (11/01/2023)   Social Connection and Isolation Panel    Frequency of Communication with Friends and Family: Three times a week    Frequency of Social Gatherings with Friends and Family: Once a week    Attends Religious Services: Never    Database Administrator or Organizations: Yes    Attends Banker Meetings: More than 4 times per year    Marital Status: Married  Depression (PHQ2-9): Low Risk (05/06/2023)   Depression (PHQ2-9)    PHQ-2 Score: 3  Alcohol Screen: Low Risk (12/18/2021)   Alcohol Screen    Last Alcohol Screening Score (AUDIT): 0  Housing:  Low Risk (11/01/2023)   Epic    Unable to Pay for Housing in the Last Year: No    Number of Times Moved in the Last Year: 0    Homeless in the Last Year: No  Utilities: Not At Risk (11/09/2022)   AHC Utilities    Threatened with loss of utilities: No  Health Literacy: Not on file     Family History: The patient's family history includes Heart disease in his father; Prostate cancer in his father. There is no history of Colon cancer, Esophageal cancer, Pancreatic cancer, or Stomach cancer.  ROS:   Please see the history of present illness.     All other systems reviewed and are negative.  EKGs/Labs/Other Studies Reviewed:    The following studies were reviewed today:   EKG:   08/28/2021: Sinus bradycardia, first-degree AV block, rate 59, no ST abnormalities  Recent Labs: 05/06/2023: ALT 20; Pro B Natriuretic peptide (BNP) 65.0; TSH 0.86 01/12/2024: BUN 22; Creatinine, Ser 1.10; Hemoglobin 15.2; Magnesium 2.1; Platelets 327; Potassium 4.4; Sodium 141  Recent Lipid Panel    Component Value Date/Time   CHOL 118 05/06/2023 0932   CHOL 130 09/28/2019 1006   TRIG 128.0 05/06/2023 0932   HDL 42.00 05/06/2023 0932   HDL 46 09/28/2019 1006   CHOLHDL 3 05/06/2023 0932   VLDL 25.6 05/06/2023 0932   LDLCALC 51 05/06/2023 0932   LDLCALC 61 09/28/2019 1006   LDLDIRECT 98.0 02/28/2018 1546    Physical Exam:    VS:  BP  118/72   Pulse 61   Ht 6' (1.829 m)   Wt 202 lb (91.6 kg)   SpO2 95%   BMI 27.40 kg/m     Wt Readings from Last 3 Encounters:  02/07/24 202 lb (91.6 kg)  01/21/24 196 lb (88.9 kg)  01/12/24 201 lb 12.8 oz (91.5 kg)     GEN: Well nourished, well developed in no acute distress HEENT: Normal NECK: No JVD; No carotid bruits LYMPHATICS: No lymphadenopathy CARDIAC: RRR, no murmurs, rubs, gallops RESPIRATORY:  Clear to auscultation without rales, wheezing or rhonchi  ABDOMEN: Soft, non-tender, non-distended MUSCULOSKELETAL:  No edema; No deformity  SKIN: Warm and dry NEUROLOGIC:  Alert and oriented x 3 PSYCHIATRIC:  Normal affect   ASSESSMENT:    1. SOB (shortness of breath)   2. Coronary artery disease involving native coronary artery of native heart without angina pectoris   3. Primary hypertension   4. Hyperlipidemia, unspecified hyperlipidemia type       PLAN:    CAD: calcium  score on 06/23/2019 was 1716 (97th percentile).  He reported atypical chest pain.  Lexiscan  Myoview  on 07/21/2019 showed large size moderate intensity fixed inferior/inferoseptal perfusion defect with normal wall motion, suggestive of artifact or less likely scar, no ischemia, LVEF 51%.  Echocardiogram on 08/03/2019 showed normal biventricular function, mild AI.   Coronary CTA 05/2023 showed severe stenosis in RCA (CTFFR borderline at 0.79), also with severe stenosis in distal RCA (CT FFR dropped to 0.63, but small vessel), calcium  score 2502 (96 percentile).  LHC 01/21/2024 showed 50% mid RCA stenosis with aneurysmal segment of first PL branch but no significant stenosis. -Continue atorvastatin  40 mg daily -Continue aspirin  81 mg daily  DOE: Continues to report dyspnea on exertion, nonobstructive CAD on cath as above.  Will check echocardiogram  Hyperlipidemia: Continue atorvastatin  40 mg daily.  LDL 51 on 05/2023  Hypertension: On carvedilol  3.25 mg twice daily, losartan  100 mg daily, and indapamide .   Appears  controlled  RTC in 6 months   Medication Adjustments/Labs and Tests Ordered: Current medicines are reviewed at length with the patient today.  Concerns regarding medicines are outlined above.  Orders Placed This Encounter  Procedures   ECHOCARDIOGRAM COMPLETE   No orders of the defined types were placed in this encounter.   Patient Instructions  Medication Instructions:  Your physician recommends that you continue on your current medications as directed. Please refer to the Current Medication list given to you today.  *If you need a refill on your cardiac medications before your next appointment, please call your pharmacy*  Testing/Procedures: Your physician has requested that you have an echocardiogram. Echocardiography is a painless test that uses sound waves to create images of your heart. It provides your doctor with information about the size and shape of your heart and how well your hearts chambers and valves are working. This procedure takes approximately one hour. There are no restrictions for this procedure. Please do NOT wear cologne, perfume, aftershave, or lotions (deodorant is allowed). Please arrive 15 minutes prior to your appointment time.  Please note: We ask at that you not bring children with you during ultrasound (echo/ vascular) testing. Due to room size and safety concerns, children are not allowed in the ultrasound rooms during exams. Our front office staff cannot provide observation of children in our lobby area while testing is being conducted. An adult accompanying a patient to their appointment will only be allowed in the ultrasound room at the discretion of the ultrasound technician under special circumstances. We apologize for any inconvenience.   Follow-Up: At [facility], you and your health needs are our priority.  As part of our continuing mission to provide you with exceptional heart care, our providers are all part of one team.  This  team includes your primary Cardiologist (physician) and Advanced Practice Providers or APPs (Physician Assistants and Nurse Practitioners) who all work together to provide you with the care you need, when you need it.  Your next appointment:   6 month(s)  Provider:   Lonni LITTIE Nanas, MD                Signed, Lonni LITTIE Nanas, MD  02/07/2024 12:54 PM    [facility] "

## 2024-03-02 ENCOUNTER — Ambulatory Visit: Admitting: Dermatology

## 2024-03-14 ENCOUNTER — Ambulatory Visit (HOSPITAL_COMMUNITY)

## 2024-05-03 ENCOUNTER — Encounter: Admitting: Internal Medicine

## 2024-05-03 ENCOUNTER — Ambulatory Visit
